# Patient Record
Sex: Female | Born: 1955 | Race: White | Hispanic: No | Smoking: Never smoker
Health system: Southern US, Community
[De-identification: ages and names within clinical notes are randomized; demographics above are authoritative.]

## PROBLEM LIST (undated history)

## (undated) DIAGNOSIS — B029 Zoster without complications: Secondary | ICD-10-CM

## (undated) DIAGNOSIS — G4730 Sleep apnea, unspecified: Secondary | ICD-10-CM

## (undated) DIAGNOSIS — M1990 Unspecified osteoarthritis, unspecified site: Secondary | ICD-10-CM

## (undated) DIAGNOSIS — I10 Essential (primary) hypertension: Secondary | ICD-10-CM

## (undated) DIAGNOSIS — M4800 Spinal stenosis, site unspecified: Secondary | ICD-10-CM

## (undated) DIAGNOSIS — T7840XA Allergy, unspecified, initial encounter: Secondary | ICD-10-CM

## (undated) DIAGNOSIS — K219 Gastro-esophageal reflux disease without esophagitis: Secondary | ICD-10-CM

## (undated) DIAGNOSIS — F32A Depression, unspecified: Secondary | ICD-10-CM

## (undated) DIAGNOSIS — J309 Allergic rhinitis, unspecified: Secondary | ICD-10-CM

## (undated) DIAGNOSIS — G43909 Migraine, unspecified, not intractable, without status migrainosus: Secondary | ICD-10-CM

## (undated) DIAGNOSIS — F329 Major depressive disorder, single episode, unspecified: Secondary | ICD-10-CM

## (undated) DIAGNOSIS — G4733 Obstructive sleep apnea (adult) (pediatric): Secondary | ICD-10-CM

## (undated) HISTORY — DX: Gastro-esophageal reflux disease without esophagitis: K21.9

## (undated) HISTORY — DX: Major depressive disorder, single episode, unspecified: F32.9

## (undated) HISTORY — DX: Depression, unspecified: F32.A

## (undated) HISTORY — DX: Unspecified osteoarthritis, unspecified site: M19.90

## (undated) HISTORY — DX: Allergic rhinitis, unspecified: J30.9

## (undated) HISTORY — DX: Essential (primary) hypertension: I10

## (undated) HISTORY — DX: Allergy, unspecified, initial encounter: T78.40XA

## (undated) HISTORY — DX: Spinal stenosis, site unspecified: M48.00

## (undated) HISTORY — DX: Obstructive sleep apnea (adult) (pediatric): G47.33

## (undated) HISTORY — DX: Migraine, unspecified, not intractable, without status migrainosus: G43.909

## (undated) HISTORY — PX: KNEE SURGERY: SHX244

## (undated) HISTORY — DX: Zoster without complications: B02.9

---

## 1998-01-29 ENCOUNTER — Ambulatory Visit (HOSPITAL_COMMUNITY): Admission: RE | Admit: 1998-01-29 | Discharge: 1998-01-29 | Payer: Self-pay | Admitting: *Deleted

## 1998-06-26 ENCOUNTER — Encounter: Admission: RE | Admit: 1998-06-26 | Discharge: 1998-07-26 | Payer: Self-pay | Admitting: Orthopedic Surgery

## 1998-08-16 ENCOUNTER — Ambulatory Visit (HOSPITAL_COMMUNITY): Admission: RE | Admit: 1998-08-16 | Discharge: 1998-08-16 | Payer: Self-pay | Admitting: *Deleted

## 1998-12-10 ENCOUNTER — Other Ambulatory Visit: Admission: RE | Admit: 1998-12-10 | Discharge: 1998-12-10 | Payer: Self-pay | Admitting: *Deleted

## 1999-04-21 ENCOUNTER — Ambulatory Visit (HOSPITAL_COMMUNITY): Admission: RE | Admit: 1999-04-21 | Discharge: 1999-04-21 | Payer: Self-pay | Admitting: *Deleted

## 2001-12-08 ENCOUNTER — Ambulatory Visit (HOSPITAL_COMMUNITY): Admission: RE | Admit: 2001-12-08 | Discharge: 2001-12-08 | Payer: Self-pay | Admitting: Internal Medicine

## 2001-12-08 ENCOUNTER — Encounter: Payer: Self-pay | Admitting: Internal Medicine

## 2001-12-15 ENCOUNTER — Encounter (INDEPENDENT_AMBULATORY_CARE_PROVIDER_SITE_OTHER): Payer: Self-pay | Admitting: *Deleted

## 2001-12-15 ENCOUNTER — Encounter: Payer: Self-pay | Admitting: Internal Medicine

## 2001-12-15 ENCOUNTER — Encounter: Admission: RE | Admit: 2001-12-15 | Discharge: 2001-12-15 | Payer: Self-pay | Admitting: Internal Medicine

## 2003-10-19 ENCOUNTER — Emergency Department (HOSPITAL_COMMUNITY): Admission: EM | Admit: 2003-10-19 | Discharge: 2003-10-19 | Payer: Self-pay | Admitting: Emergency Medicine

## 2004-08-07 ENCOUNTER — Ambulatory Visit (HOSPITAL_COMMUNITY): Admission: RE | Admit: 2004-08-07 | Discharge: 2004-08-07 | Payer: Self-pay | Admitting: Internal Medicine

## 2004-08-18 ENCOUNTER — Encounter: Admission: RE | Admit: 2004-08-18 | Discharge: 2004-08-18 | Payer: Self-pay | Admitting: Internal Medicine

## 2008-08-06 ENCOUNTER — Encounter: Admission: RE | Admit: 2008-08-06 | Discharge: 2008-08-06 | Payer: Self-pay | Admitting: Family Medicine

## 2010-03-02 HISTORY — PX: OTHER SURGICAL HISTORY: SHX169

## 2010-06-17 ENCOUNTER — Other Ambulatory Visit: Payer: Self-pay | Admitting: Orthopedic Surgery

## 2010-06-17 ENCOUNTER — Encounter (HOSPITAL_COMMUNITY): Payer: 59

## 2010-06-17 DIAGNOSIS — Z01812 Encounter for preprocedural laboratory examination: Secondary | ICD-10-CM | POA: Insufficient documentation

## 2010-06-17 DIAGNOSIS — Z01818 Encounter for other preprocedural examination: Secondary | ICD-10-CM | POA: Insufficient documentation

## 2010-06-17 LAB — PROTIME-INR: Prothrombin Time: 13.2 seconds (ref 11.6–15.2)

## 2010-06-17 LAB — COMPREHENSIVE METABOLIC PANEL
Alkaline Phosphatase: 85 U/L (ref 39–117)
CO2: 29 mEq/L (ref 19–32)
Chloride: 104 mEq/L (ref 96–112)
GFR calc Af Amer: >60 mL/min (ref >60)
Potassium: 3.9 mEq/L (ref 3.5–5.1)
Sodium: 142 mEq/L (ref 135–145)
Total Bilirubin: 0.5 mg/dL (ref 0.3–1.2)
Total Protein: 7.8 g/dL (ref 6.0–8.3)

## 2010-06-17 LAB — URINALYSIS, ROUTINE W REFLEX MICROSCOPIC
Glucose, UA: NEGATIVE mg/dL
Protein, ur: NEGATIVE mg/dL
Specific Gravity, Urine: 1.021 (ref 1.005–1.030)
pH: 6 (ref 5.0–8.0)

## 2010-06-17 LAB — SURGICAL PCR SCREEN
MRSA, PCR: NEGATIVE
Staphylococcus aureus: NEGATIVE

## 2010-06-17 LAB — CBC
MCV: 86.3 fL (ref 78.0–100.0)
Platelets: 300 10*3/uL (ref 150–400)
RBC: 4.61 MIL/uL (ref 3.87–5.11)
RDW: 14 % (ref 11.5–15.5)

## 2010-06-23 ENCOUNTER — Inpatient Hospital Stay (HOSPITAL_COMMUNITY)
Admission: RE | Admit: 2010-06-23 | Discharge: 2010-06-26 | DRG: 470 | Disposition: A | Discharge status: Home Health | Payer: 59 | Source: Ambulatory Visit | Attending: Orthopedic Surgery | Admitting: Orthopedic Surgery

## 2010-06-23 DIAGNOSIS — M1710 Unilateral primary osteoarthritis, unspecified knee: Principal | ICD-10-CM | POA: Diagnosis present

## 2010-06-23 DIAGNOSIS — Z6841 Body Mass Index (BMI) 40.0 and over, adult: Secondary | ICD-10-CM

## 2010-06-23 DIAGNOSIS — G4733 Obstructive sleep apnea (adult) (pediatric): Secondary | ICD-10-CM | POA: Diagnosis present

## 2010-06-23 DIAGNOSIS — K219 Gastro-esophageal reflux disease without esophagitis: Secondary | ICD-10-CM | POA: Diagnosis present

## 2010-06-23 DIAGNOSIS — Z01812 Encounter for preprocedural laboratory examination: Secondary | ICD-10-CM

## 2010-06-23 LAB — GLUCOSE, CAPILLARY: Glucose-Capillary: 150 mg/dL — ABNORMAL HIGH (ref 70–99)

## 2010-06-24 LAB — GLUCOSE, CAPILLARY
Glucose-Capillary: 139 mg/dL — ABNORMAL HIGH (ref 70–99)
Glucose-Capillary: 113 mg/dL — ABNORMAL HIGH (ref 70–99)
Glucose-Capillary: 120 mg/dL — ABNORMAL HIGH (ref 70–99)
Glucose-Capillary: 105 mg/dL — ABNORMAL HIGH (ref 70–99)

## 2010-06-24 LAB — CBC
Hemoglobin: 12.1 g/dL (ref 12.0–15.0)
MCH: 28 pg (ref 26.0–34.0)
MCHC: 32.3 g/dL (ref 30.0–36.0)
MCV: 86.8 fL (ref 78.0–100.0)
RBC: 4.32 MIL/uL (ref 3.87–5.11)
RDW: 13.7 % (ref 11.5–15.5)

## 2010-06-24 LAB — BASIC METABOLIC PANEL
GFR calc non Af Amer: >60 mL/min (ref >60)
Sodium: 137 mEq/L (ref 135–145)

## 2010-06-25 LAB — BASIC METABOLIC PANEL
GFR calc Af Amer: >60 mL/min (ref >60)
GFR calc non Af Amer: >60 mL/min (ref >60)
Potassium: 4.3 mEq/L (ref 3.5–5.1)
Sodium: 137 mEq/L (ref 135–145)

## 2010-06-25 LAB — CBC
HCT: 36.8 % (ref 36.0–46.0)
MCH: 28 pg (ref 26.0–34.0)
MCHC: 32.1 g/dL (ref 30.0–36.0)
Platelets: 257 10*3/uL (ref 150–400)

## 2010-06-25 LAB — GLUCOSE, CAPILLARY
Glucose-Capillary: 101 mg/dL — ABNORMAL HIGH (ref 70–99)
Glucose-Capillary: 120 mg/dL — ABNORMAL HIGH (ref 70–99)

## 2010-06-26 LAB — GLUCOSE, CAPILLARY: Glucose-Capillary: 106 mg/dL — ABNORMAL HIGH (ref 70–99)

## 2010-06-26 LAB — CBC
Hemoglobin: 11.7 g/dL — ABNORMAL LOW (ref 12.0–15.0)
MCH: 28.1 pg (ref 26.0–34.0)
MCHC: 32.2 g/dL (ref 30.0–36.0)
RDW: 13.8 % (ref 11.5–15.5)

## 2010-06-30 NOTE — Op Note (Signed)
Michelle Sanchez               ACCOUNT NO.:  [id number]  MEDICAL RECORD NO.:  [id number]           PATIENT TYPE:  I  LOCATION:  0004                         FACILITY:  [facility]  PHYSICIAN:  Ollen Gross, M.D.    DATE OF BIRTH:  Oct 13, 1955  DATE OF PROCEDURE:  06/23/2010 DATE OF DISCHARGE:                              OPERATIVE REPORT   PREOPERATIVE DIAGNOSIS:  Osteoarthritis, right knee.  POSTOPERATIVE DIAGNOSIS:  Osteoarthritis, right knee.  PROCEDURE:  Right total knee arthroplasty.  SURGEON:  Ollen Gross, M.D.  ASSISTANT:  Rozell Searing, PA-C  ANESTHESIA:  Spinal.  ESTIMATED BLOOD LOSS:  Minimal.  DRAIN:  Hemovac x1.  TOURNIQUET TIME:  43 minutes at 300 mmHg.  COMPLICATIONS:  None.  CONDITION.:  Stable to recovery.  BRIEF CLINICAL NOTE:  Michelle Sanchez is a 55 year old female with advanced end- stage arthritis of both knees, right more symptomatic than the left. She has failed nonoperative management and presents for total knee arthroplasty.  PROCEDURE IN DETAIL:  After successful administration of spinal anesthetic, a tourniquet was placed high on her right thigh and her right lower extremity was prepped and draped in the usual sterile fashion.  Extremity was wrapped in Esmarch, knee flexed, tourniquet inflated to 300 mmHg.  Midline incision was made with a #10 blade through the subcutaneous tissue to the level of the extensor mechanism. A fresh blade was used to make a medial parapatellar arthrotomy.  Soft tissue on the proximal medial tibia subperiosteally elevated to the joint line with the knife and into the semimembranosus bursa with a Cobb elevator.  Soft tissue laterally was elevated with attention being paid to avoid patellar tendon on tibial tubercle.  Patella was everted, knee flexed 90 degrees, and ACL and PCL removed.  Drill was used create a starting hole in the distal femur and the canal was thoroughly irrigated.  The 5-degree right valgus  alignment guide was placed and distal femoral cutting block was pinned to remove 10 mm off the distal femur.  Distal femoral resection was made with an oscillating saw.  The tibia subluxed forward and the menisci were removed.  Extramedullary tibial alignment guide was placed referencing proximally at the medial aspect of the tibial tubercle and distally along the second metatarsal axis and tibial crest.  Block was pinned to remove 2 mm off the more deficient medial side.  Tibial resection was made with an oscillating saw.  Size 3 was the most appropriate tibial component and proximal tibia was repaired with modular drill and keel punch for the size 3.  Femoral sizing guide was placed, size 3 was the most appropriate on the femur.  Rotation was marked at the epicondylar axis and confirmed by creating rectangular flexion gap at 90 degrees.  The block was pinned in this rotation and anterior-posterior chamfer cuts made.  Intercondylar block was placed and that cut was made.  Trial size 3 posterior stabilized femur was placed.  A 12.5-mm posterior stabilized rotating platform insert trial was placed.  With a 12.5, full extension was achieved with excellent varus-valgus and anterior-posterior balance throughout full range of motion.  The patella was  everted, thickness measured to be 25 mm.  Freehand resection was taken to 15 mm, 35 template was placed, lug holes were drilled, trial patella was placed and it tracks normally.  The osteophytes were removed off the posterior femur with the trial in place.  All trials were removed and the cut bone surfaces were prepared with pulsatile lavage.  Cement was mixed and once ready for implantation, the size 3 mobile bearing tibial tray, size 3 posterior stabilized femur, and 35 patella were cemented into place and the patella was held with a clamp.  Trial 12.5-mm insert was placed, knee held in full extension, and all extruded cement removed.  When  the cement fully hardened, then the permanent 12.5-mm posterior stabilized rotating platform insert was placed in the tibial tray.  Wound was copiously irrigated with saline solution and the arthrotomy closed over Hemovac drain with interrupted #1 PDS.  Flexion against gravity was to 135 degrees.  Patella tracked normally.  Tourniquet released, total tourniquet time of 43 minutes.  Subcu was closed with interrupted 2-0 Vicryl, subcuticular with running 4-0 Monocryl.  Catheter for Marcaine pain pump was placed and pump initiated.  The incision was then cleaned and dried and Steri-Strips applied.  A bulky sterile dressing was applied and she was then awakened and transported to recovery in stable condition.     Ollen Gross, M.D.     FA/MEDQ  D:  06/23/2010  T:  06/23/2010  Job:  [id number]  Electronically Signed by Ollen Gross M.D. on 06/30/2010 07:12:18 AM

## 2010-07-07 NOTE — H&P (Signed)
Michelle Sanchez, Michelle Sanchez               ACCOUNT NO.:  [id number]  MEDICAL RECORD NO.:  [id number]           PATIENT TYPE:  I  LOCATION:  1617                         FACILITY:  [facility]  PHYSICIAN:  Ollen Gross, M.D.    DATE OF BIRTH:  [birth year]  DATE OF ADMISSION:  06/23/2010 DATE OF DISCHARGE:  06/26/2010                             HISTORY & PHYSICAL   CHIEF COMPLAINT:  Right greater than left knee pain.  HISTORY OF PRESENT ILLNESS:  The patient is a 55 year old female who has been seen as second opinion for bilateral knee pain, the right knee is more problematic and symptomatic than the left.  She has been previously treated over [employer], had multiple injections including Supartz.  Despite conservative measures and injections, she continued to have progressive pain.  She has advanced arthritis in both knees, it is bone-on-bone in the right knee and patellofemoral with flattening of the femoral condyle, looks like she also has some osteochondral defects, bone-on-bone medial and less patellofemoral diseases on the left, elects for undergoing surgical intervention.  Risks and benefits have been discussed.  She would like to proceed with surgery.  She has been seen by Dr. Raquel Sarna preoperatively, felt to be stable for surgery.  ALLERGIES:  VOLTAREN causes hives.  Intolerances, SEPTRA causes diarrhea.  CURRENT MEDICATIONS:  Meloxicam, omeprazole, omega 3, Ambien, Super B complex, inositol,  magnesium, and Allegra.  PAST MEDICAL HISTORY:  Migraines, sleep apnea.  She uses CPAP.  Heart murmur, reflux disease, arthritis, postmenopausal.  PAST SURGICAL HISTORY:  Left knee tendon surgery, arthroscopic surgery on both knees.  FAMILY HISTORY:  Father deceased at age 18 with cancer.  Mother with heart problems, COPD.  Brother deceased at age 26 with diabetes.  SOCIAL HISTORY:  Single, retired, nonsmoker.  Occasional glass of wine. Her boyfriend will be assisting  with care after surgery.  She lives in [place] home, 3 to 4 steps going to the back, 4 to 5 steps going to the front.  REVIEW OF SYSTEMS:  GENERAL:  No fevers, chills, night sweats.  NEURO: No seizure, syncope, or paralysis.  RESPIRATORY:  No shortness breath, productive cough or hemoptysis.  CARDIOVASCULAR:  No chest pain, orthopnea.  GI:  No nausea, vomiting, diarrhea, constipation.  GU:  No dysuria, hematuria, or discharge.  MUSCULOSKELETAL:  Knee pain.  PHYSICAL EXAMINATION:  VITAL SIGNS:  Pulse 96, respirations 12, blood pressure 132/86. GENERAL:  A 55 year old white female, well nourished, well developed, overweight, alert, oriented, cooperative, pleasant, good historian. HEENT:  Normocephalic, atraumatic.  Pupils round and reactive.  EOMs intact. NECK:  Supple. CHEST:  Clear. HEART:  Regular rate and rhythm without murmur or rubs.  S1, S2. ABDOMEN:  Soft, nontender.  Bowel sounds present. RECTAL:  Not done, not pertinent to present illness. BREASTS:  Not done, not pertinent to present illness. GENITALIA:  Not done, not pertinent to present illness. EXTREMITIES:  Left knee, no effusion, range of motion 5 to 120, marked crepitus and tender more medial than lateral.  Right knee, no effusion, range of motion 10 to 120, marked crepitus tender more medial than lateral.  IMPRESSION:  Osteoarthritis in bilateral knees, right more symptomatic than left.  PLAN:  The patient will be admitted to [facility] to undergo right total knee replacement arthroplasty.  Surgery will be performed by Dr. Ollen Gross.     Alexzandrew L. Julien Girt, P.A.C.   ______________________________ Ollen Gross, M.D.    ALP/MEDQ  D:  06/30/2010  T:  06/30/2010  Job:  [id number]  cc:   Alexia Freestone, M.D. Fax: [phone]  Electronically Signed by Patrica Duel P.A.C. on 07/03/2010 10:06:34 AM Electronically Signed by Ollen Gross M.D. on 07/07/2010 06:47:03 PM

## 2010-07-23 NOTE — Discharge Summary (Signed)
Michelle Sanchez, Michelle Sanchez               ACCOUNT NO.:  [id number]  MEDICAL RECORD NO.:  [id number]           PATIENT TYPE:  I  LOCATION:  1617                         FACILITY:  [facility]  PHYSICIAN:  Ollen Gross, M.D.    DATE OF BIRTH:  March 15, 1955  DATE OF ADMISSION:  06/23/2010 DATE OF DISCHARGE:  06/26/2010                              DISCHARGE SUMMARY   ADMITTING DIAGNOSES: 1. Osteoarthritis of the right knee greater than the left knee. 2. Migraines. 3. Sleep apnea. 4. Heart murmur. 5. Reflux disease. 6. Osteoarthritis. 7. Postmenopausal.  DISCHARGE DIAGNOSES: 1. Osteoarthritis of the right knee status post right total-knee     replacement arthroplasty. 2. Osteoarthritis of the left knee. 3. Migraines. 4. Sleep apnea. 5. Heart murmur. 6. Reflux disease. 7. Osteoarthritis. 8. Postmenopausal.  PROCEDURE:  June 23, 2010, right total-knee arthroplasty.  SURGEON:  Ollen Gross, MD  ASSISTANT:  Rozell Searing, PA-C  ANESTHESIA:  Spinal anesthesia.  TOURNIQUET TIME:  43 minutes.  CONSULTATIONS:  None.  BRIEF HISTORY:  The patient is a 55 year old female with advanced arthritis of both knees.  The right is more symptomatic and problematic than the left.  She now presents for a total-knee arthroplasty.  LABORATORY DATA:  The admission CBC was not scanned into this chart.  I do not have it available, but serial CBCs were followed.  Hemoglobin was 12.1 postoperative and then 11.8.  Last noted H and H was 11.7 and 36.3. The admission Chemistry panel was not scanned into this chart.  Serial BMETs were followed for 48 hours.  Electrolytes all remained within normal limits.  Blood group type O positive.  HOSPITAL COURSE:  The patient was admitted to [facility], taken to the OR, and underwent the above-stated procedure without complication.  The patient tolerated the procedure well and was later transferred to the recovery room on the orthopedic floor.  She  was started on p.o. and IV analgesics.  She had a pretty rough night on the evening of surgery.  She was doing a little bit better on the morning of day #1.  Output was a little low initially so we gave her some fluids to increase that.  She was started back on her home medications. Hemoglobin looked good and pressure was stable.  She started getting up out of bed on day #2.  She was feeling better.  Her hemoglobin looked good.  Dressing was changed and the incision was healing well.  She was started on Xarelto for DVT prophylaxis.  She continued to progress well with physical therapy and by day #3 was meeting her goals and discharged home.  DISCHARGE PLAN:  The patient was discharged home on June 26, 2010.  DISCHARGE DIAGNOSES:  Please see above.  DISCHARGE MEDICATIONS:  Robaxin, Percocet, Xarelto.  Continue home medications of natural tablets, omeprazole and Singulair.  DIET:  As tolerated.  ACTIVITY:  Weightbearing as tolerated.  Total knee protocol.  Home health PT.  FOLLOWUP:  Follow up in 2 weeks.  DISPOSITION:  Home.  CONDITION ON DISCHARGE:  Improved.     Alexzandrew L. Perkins, P.A.C.   ______________________________ Homero Fellers  Marlowe Lawes, M.D.    ALP/MEDQ  D:  07/17/2010  T:  07/17/2010  Job:  [id number]  cc:   Alexia Freestone, M.D. Fax: [phone]  Electronically Signed by Patrica Duel P.A.C. on 07/21/2010 11:05:41 AM Electronically Signed by Ollen Gross M.D. on 07/23/2010 12:52:30 PM

## 2011-01-08 ENCOUNTER — Other Ambulatory Visit: Payer: Self-pay | Admitting: Orthopedic Surgery

## 2011-01-09 ENCOUNTER — Other Ambulatory Visit: Payer: Self-pay | Admitting: Orthopedic Surgery

## 2011-02-18 ENCOUNTER — Ambulatory Visit (INDEPENDENT_AMBULATORY_CARE_PROVIDER_SITE_OTHER): Payer: 59 | Admitting: Internal Medicine

## 2011-02-18 ENCOUNTER — Encounter: Payer: Self-pay | Admitting: Internal Medicine

## 2011-02-18 VITALS — BP 142/100 | HR 100 | Temp 98.0°F | Ht 68.0 in | Wt 293.0 lb

## 2011-02-18 DIAGNOSIS — G43909 Migraine, unspecified, not intractable, without status migrainosus: Secondary | ICD-10-CM

## 2011-02-18 DIAGNOSIS — IMO0002 Reserved for concepts with insufficient information to code with codable children: Secondary | ICD-10-CM

## 2011-02-18 DIAGNOSIS — I10 Essential (primary) hypertension: Secondary | ICD-10-CM

## 2011-02-18 DIAGNOSIS — Z01818 Encounter for other preprocedural examination: Secondary | ICD-10-CM

## 2011-02-18 DIAGNOSIS — K219 Gastro-esophageal reflux disease without esophagitis: Secondary | ICD-10-CM

## 2011-02-18 DIAGNOSIS — M1710 Unilateral primary osteoarthritis, unspecified knee: Secondary | ICD-10-CM

## 2011-02-18 MED ORDER — OMEPRAZOLE 20 MG PO CPDR: 20.0000 mg | DELAYED_RELEASE_CAPSULE | Freq: Every day | ORAL | Status: DC

## 2011-02-18 MED ORDER — METAXALONE 800 MG PO TABS: 800.0000 mg | ORAL_TABLET | Freq: Three times a day (TID) | ORAL | Status: AC | PRN

## 2011-02-18 MED ORDER — MELOXICAM 15 MG PO TABS: 15.0000 mg | ORAL_TABLET | Freq: Every day | ORAL | Status: DC

## 2011-02-18 MED ORDER — AMLODIPINE BESYLATE 5 MG PO TABS: 5.0000 mg | ORAL_TABLET | Freq: Every day | ORAL | Status: DC

## 2011-02-18 NOTE — Assessment & Plan Note (Signed)
See above - s/p R TKR 06/2010, planning L TKR 04/2011 Resume meloxicam and skelaxin for associated with back spasm and pain

## 2011-02-18 NOTE — Patient Instructions (Signed)
It was good to see you today. Medications reviewed, start Norvasc 5mg  daily for blood pressure - also skelaxin as needed for muscle spasm - Your prescription(s) have been submitted to your pharmacy. Please take as directed and contact our office if you believe you are having problem(s) with the medication(s). Refill on medication(s) as discussed today. You have been evaluated and it is felt that the surgical risk is low and outweighed by the potential benefit of the surgery. Therefore, medically clear to proceed when scheduling allows. Will complete forma and send to Dr. Berton Lan as requested

## 2011-02-18 NOTE — Assessment & Plan Note (Signed)
On PPI - The current medical regimen is effective;  continue present plan and medications.  

## 2011-02-18 NOTE — Progress Notes (Signed)
Subjective:    [name], female    DOB: 11-04-1955, 55 y.o.   MRN: [id number]  HPI New pt to me and our practice, here to establish care  Reviewed chronic medical issues: HTN - on tx until 2010 - then stopped same due to successful BP control with weight loss - now weight gain with increase BP reading - willing to resume med - prev on norvasc without adverse side effects   low back pain and spasm - worse with knee problems - prev on meloxicam and skelakin as needed, ?resume same  GERD - the patient reports compliance with medication(s) as prescribed. Denies adverse side effects. Requests refills  Also here for preop clearance - requested by Dr. Roswell Miners and planning L TKR 04/2011 no chest pain, change in edema - no shortness of breath or dyspnea on exertion  no history of coronary artery disease or chronic kidney disease; no diabetes mellitus   pt able to walk 2 blocks without fatigue (but limited by pain in knees, especially if climbing stairs)   Past Medical History  Diagnosis Date  . Arthritis   . GERD (gastroesophageal reflux disease)   . Heart murmur   . Hypertension   . Migraines    Family History  Problem Relation Age of Onset  . Arthritis Mother   . Breast cancer Mother   . Arthritis Father   . Alcohol abuse Other   . Diabetes Other    History  Substance Use Topics  . Smoking status: Never Smoker   . Smokeless tobacco: Not on file  . Alcohol Use: No   Review of Systems Constitutional: Negative for fever or weight change.  Respiratory: Negative for cough and shortness of breath.   Cardiovascular: Negative for chest pain or palpitations.  Gastrointestinal: Negative for abdominal pain, no bowel changes.  Musculoskeletal: Negative for gait problem or joint swelling.  Skin: Negative for rash.  Neurological: Negative for dizziness or headache.  No other specific complaints in a complete review of systems (except as listed in HPI above).       Objective:   Physical Exam BP 142/100  Pulse 100  Temp(Src) 98 F (36.7 C) (Oral)  Ht 5\' 8"  (1.727 m)  Wt 293 lb (132.904 kg)  BMI 44.55 kg/m2  SpO2 99% Wt Readings from Last 3 Encounters:  02/18/11 293 lb (132.904 kg)   Constitutional: She is overweight, but appears well-developed and well-nourished. No distress.  HENT: Head: Normocephalic and atraumatic. Ears: B TMs ok, no erythema or effusion; Nose: Nose normal.  Mouth/Throat: Oropharynx is clear and moist. No oropharyngeal exudate.  Eyes: Conjunctivae and EOM are normal. Pupils are equal, round, and reactive to light. No scleral icterus.  Neck: Normal range of motion. Neck supple. No JVD present. No thyromegaly present.  Cardiovascular: Normal rate, regular rhythm and normal heart sounds.  No murmur heard. No BLE edema. Pulmonary/Chest: Effort normal and breath sounds normal. No respiratory distress. She has no wheezes.  Abdominal: Soft. Bowel sounds are normal. She exhibits no distension. There is no tenderness. no masses Musculoskeletal:L knee - boggy synovitis - tender to palpation over joint line; FROM and ligamentous function intact - r knee s/p TKR with normal range of motion, no joint effusions. No gross deformities Neurological: She is alert and oriented to person, place, and time. No cranial nerve deficit. Coordination normal.  Skin: Skin is warm and dry. No rash noted. No erythema.  Psychiatric: She has a normal mood and affect. Her  behavior is normal. Judgment and thought content normal.   Lab Results  Component Value Date   WBC 11.0* 06/26/2010   HGB 11.7* 06/26/2010   HCT 36.3 06/26/2010   PLT 270 06/26/2010   GLUCOSE 105* 06/25/2010   ALT 19 06/17/2010   AST 15 06/17/2010   NA 137 06/25/2010   K 4.3 06/25/2010   CL 103 06/25/2010   CREATININE 0.62 06/25/2010   BUN 7 06/25/2010   CO2 31 06/25/2010   INR 0.98 06/17/2010   EKG - NSR @ 95 bpm - no ischemic change or arrythmia     Assessment & Plan:  Preop clearance - L  TKR planned 04/2011 - This patient has been evaluated and it is felt that the surgical risk is low and outweighed by the potential benefit of the surgery. Therefore, medically clear to proceed when scheduling allows. Form competed and will fax to ortho as requested  Also see problem list. Medications and labs reviewed today.

## 2011-02-18 NOTE — Assessment & Plan Note (Signed)
Uncontrolled - resume prior tx (on norvasc until 2010) - new erx done today EKG without ischemic changes follow up 6-12 weeks to reassess tx and titrate as needed - Resume diet and exercise efforts for weight control of same  BP Readings from Last 3 Encounters:  02/18/11 142/100

## 2011-03-22 ENCOUNTER — Other Ambulatory Visit: Payer: Self-pay | Admitting: Orthopedic Surgery

## 2011-03-22 NOTE — H&P (Signed)
Michelle Sanchez  DOB: 11-Apr-1955 Single / Language: [place] / Race: White / Female  Date of Admission:  04/13/2011  Chief Complaint:  Left Knee Pain  History of Present Illness The patient is a 56 year old female who comes in today for a preoperative History and Physical. The patient is scheduled for a left total knee arthroplasty to be performed by Dr. Gus Rankin. Aluisio, MD at [facility] on 04/13/2011. They have been treated conservatively in the past for the above stated problem and despite conservative measures, they continue to have progressive pain and severe functional limitations and dysfunction. They have failed non-operative management including home exercise, medications, and injections. It is felt that they would benefit from undergoing total joint replacement. Risks and benefits of the procedure have been discussed with the patient and they elect to proceed with surgery. There are no active contraindications to surgery such as ongoing infection or rapidly progressive neurological disease.  Allergies Voltaren *ANALGESICS - ANTI-INFLAMMATORY*. Hives. Septra *ANTI-INFECTIVE AGENTS - MISC.*. Diarrhea.  Medication History Omeprazole (20MG  Capsule DR, Oral daily) Active. AmLODIPine Besylate (5MG  Tablet, Oral daily) Active. Meloxicam (15MG  Tablet, Oral daily) Active. Vitamin ( Oral daily) Active.  Problem List/Past Medical Migraine Headache Sleep Apnea. uses CPAP Hypertension Osteoarthrosis NOS, lower leg (715.96). 07/29/2010 Total knee replacement status (V43.65)  Past Surgical History Total Knee Replacement - Right. Date: 06/23/2010. Arthroscopic Knee Surgery - Both. multiple procedures  Family History Father. Deceased, Cancer. age 44 Mother. Living, Heart disease. Lung problems, age 39  Social History Tobacco use Alcohol use. Occasional alcohol use. Marital status. Single. Living situation. Lives with caregiver. boyfriend Current work status.  Retired. Post-Surgical Plans. Plans to go home.  Review of Systems General:Not Present- Chills, Fever, Night Sweats, Fatigue, Weight Gain, Weight Loss and Memory Loss. Skin:Not Present- Hives, Itching, Rash, Eczema and Lesions. HEENT:Not Present- Tinnitus, Headache, Double Vision, Visual Loss, Hearing Loss and Dentures. Respiratory:Not Present- Shortness of breath with exertion, Shortness of breath at rest, Allergies, Coughing up blood and Chronic Cough. Cardiovascular:Not Present- Chest Pain, Racing/skipping heartbeats, Difficulty Breathing Lying Down, Murmur, Swelling and Palpitations. Gastrointestinal:Not Present- Bloody Stool, Heartburn, Abdominal Pain, Vomiting, Nausea, Constipation, Diarrhea, Difficulty Swallowing, Jaundice and Loss of appetitie. Female Genitourinary:Not Present- Blood in Urine, Urinary frequency, Weak urinary stream, Discharge, Flank Pain, Incontinence, Painful Urination, Urgency, Urinary Retention and Urinating at Night. Musculoskeletal:Present- Joint Swelling, Joint Pain and Back Pain. Not Present- Muscle Weakness, Muscle Pain, Morning Stiffness and Spasms. Neurological:Not Present- Tremor, Dizziness, Blackout spells, Paralysis, Difficulty with balance and Weakness. Psychiatric:Not Present- Insomnia.  Vitals Weight: 293 lb Height: 68 in. Body Surface Area: 2.53 m Body Mass Index: 44.55 kg/m Pulse: 100 (Regular) Resp.: 12 (Unlabored) BP: 124/76 (Sitting, Left Arm, Standard)  Physical Exam The physical exam findings are as follows: Patient is a 56 year old female with continued left knee pain.  General Mental Status - Alert, cooperative and good historian. General Appearance- pleasant. Not in acute distress. Orientation- Oriented X3. Build & Nutrition- Well nourished and Well developed.  Head and Neck Head- normocephalic, atraumatic . Neck Global Assessment- supple. no bruit auscultated on the right and no bruit auscultated on  the left.  Eye Pupil- Bilateral- Regular and Round. Note: reading glasses Motion- Bilateral- EOMI.  Chest and Lung Exam Auscultation: Breath sounds:- clear at anterior chest wall and - clear at posterior chest wall. Adventitious sounds:- No Adventitious sounds.  Cardiovascular Auscultation:Rhythm- Regular rate and rhythm. Heart Sounds- S1 WNL and S2 WNL. Murmurs & Other Heart Sounds:Auscultation of  the heart reveals - No Murmurs.  Abdomen Palpation/Percussion:Tenderness- Abdomen is non-tender to palpation. Rigidity (guarding)- Abdomen is soft. Auscultation:Auscultation of the abdomen reveals - Bowel sounds normal.  Female Genitourinary Not done, not pertinent to present illness  Musculoskeletal  On exam she is in no distress. Her left knee shows no swelling. Her range of motion is about 0 to 110 to 115 degrees. There is no instability noted about the left knee. The right knee has significant varus, range 10 to 100 with marked crepitus on range of motion, tender medial greater than lateral with no instability noted.  RADIOGRAPHS: AP and lateral of both knees show the left knee prosthesis in excellent position with no periprosthetic abnormality. Right knee bone on bone degenerative change medial and patellofemoral compartments with varus deformity, marginal osteophytes and subluxation of the tibia and femur.  Assessment & Plan Osteoarthritis Left Knee  Patient is for a Left Total Knee Replacement by Dr. Lequita Halt.  Plan is to go home after the surgery.  PCP - Dr. Rene Paci - Patient has been seen by Dr. Felicity Coyer and it is felt she is stable for surgery.  Avel Peace, PA-C

## 2011-03-31 ENCOUNTER — Encounter (HOSPITAL_COMMUNITY): Payer: Self-pay

## 2011-04-07 ENCOUNTER — Encounter (HOSPITAL_COMMUNITY): Payer: Self-pay

## 2011-04-07 ENCOUNTER — Encounter (HOSPITAL_COMMUNITY)
Admission: RE | Admit: 2011-04-07 | Discharge: 2011-04-07 | Disposition: A | Discharge status: Home / Self Care | Payer: 59 | Source: Ambulatory Visit | Attending: Orthopedic Surgery | Admitting: Orthopedic Surgery

## 2011-04-07 ENCOUNTER — Ambulatory Visit (HOSPITAL_COMMUNITY)
Admission: RE | Admit: 2011-04-07 | Discharge: 2011-04-07 | Disposition: A | Discharge status: Home / Self Care | Payer: 59 | Source: Ambulatory Visit | Attending: Orthopedic Surgery | Admitting: Orthopedic Surgery

## 2011-04-07 DIAGNOSIS — Z01818 Encounter for other preprocedural examination: Secondary | ICD-10-CM | POA: Insufficient documentation

## 2011-04-07 DIAGNOSIS — Z01812 Encounter for preprocedural laboratory examination: Secondary | ICD-10-CM | POA: Insufficient documentation

## 2011-04-07 HISTORY — DX: Sleep apnea, unspecified: G47.30

## 2011-04-07 LAB — COMPREHENSIVE METABOLIC PANEL
ALT: 23 U/L (ref 0–35)
Alkaline Phosphatase: 104 U/L (ref 39–117)
CO2: 26 mEq/L (ref 19–32)
GFR calc Af Amer: >90 mL/min (ref >90)
GFR calc non Af Amer: >90 mL/min (ref >90)
Glucose, Bld: 95 mg/dL (ref 70–99)
Potassium: 3.8 mEq/L (ref 3.5–5.1)
Sodium: 140 mEq/L (ref 135–145)

## 2011-04-07 LAB — CBC
Hemoglobin: 14.1 g/dL (ref 12.0–15.0)
MCH: 28.7 pg (ref 26.0–34.0)
RBC: 4.92 MIL/uL (ref 3.87–5.11)

## 2011-04-07 LAB — URINALYSIS, ROUTINE W REFLEX MICROSCOPIC
Glucose, UA: NEGATIVE mg/dL
Hgb urine dipstick: NEGATIVE
Ketones, ur: NEGATIVE mg/dL
Protein, ur: NEGATIVE mg/dL

## 2011-04-07 LAB — APTT: aPTT: 34 seconds (ref 24–37)

## 2011-04-07 LAB — SURGICAL PCR SCREEN: Staphylococcus aureus: NEGATIVE

## 2011-04-07 MED ORDER — CHLORHEXIDINE GLUCONATE 4 % EX LIQD
60.0000 mL | Freq: Once | CUTANEOUS | Status: DC
  Filled 2011-04-07: qty 60

## 2011-04-07 NOTE — Pre-Procedure Instructions (Signed)
04-07-11 EKG 12'12/ Surgical clearance note with chart. CXR being done today.

## 2011-04-07 NOTE — Patient Instructions (Signed)
20 Michelle Sanchez  04/07/2011   Your procedure is scheduled on: 04-13-11  Report to [facility] at    0515    AM.  Call this number if you have problems the morning of surgery: [phone]   Remember:   Do not eat food:After Midnight.  .  Take these medicines the morning of surgery with A SIP OF WATER: Amlodipine, Omeprazole.   Do not wear jewelry, make-up or nail polish.  Do not wear lotions, powders, or perfumes. You may wear deodorant.  Do not shave 48 hours prior to surgery.(no shaving of legs )  Do not bring valuables to the hospital.  Contacts, dentures or bridgework may not be worn into surgery.  Leave suitcase in the car. After surgery it may be brought to your room.  For patients admitted to the hospital, checkout time is 11:00 AM the day of discharge.   Patients discharged the day of surgery will not be allowed to drive home.  Name and phone number of your driver: [name], [phone]  Special Instructions: CHG Shower Use Special Wash: 1/2 bottle night before surgery and 1/2 bottle morning of surgery.   Please read over the following fact sheets that you were given: MRSA Information, Incentive Spirometry Instruction, Blood transfusion fact sheet.

## 2011-04-12 ENCOUNTER — Other Ambulatory Visit: Payer: Self-pay | Admitting: Orthopedic Surgery

## 2011-04-12 NOTE — H&P (Signed)
Michelle Sanchez  DOB: Apr 21, 1955 Single / Language: [place] / Race: White / Female  Date of Admission:  04/13/2011  Chief Complaint:  Left Knee Pain  History of Present Illness The patient is a 56 year old female who comes in today for a preoperative History and Physical. The patient is scheduled for a left total knee arthroplasty to be performed by Dr. Gus Rankin. Aluisio, MD at [facility] on 04/13/2011. They have been treated conservatively in the past for the above stated problem and despite conservative measures, they continue to have progressive pain and severe functional limitations and dysfunction. They have failed non-operative management including home exercise, medications, and injections. It is felt that they would benefit from undergoing total joint replacement. Risks and benefits of the procedure have been discussed with the patient and they elect to proceed with surgery. There are no active contraindications to surgery such as ongoing infection or rapidly progressive neurological disease.  Allergies Voltaren - Hives. Intolerance Septra - Diarrhea.  Problem List/Past Medical Migraine Headache Sleep Apnea. uses CPAP Hypertension Osteoarthrosis NOS, lower leg (715.96). 07/29/2010 Total knee replacement status (V43.65)  Past Surgical History Total Knee Replacement - Right. Date: 06/23/2010. Arthroscopic Knee Surgery - Both. multiple procedures  Family History Father. Deceased, Cancer. age 91 Mother. Living, Heart disease. Lung problems, age 51  Social History Tobacco use Alcohol use. Occasional alcohol use. Marital status. Single. Living situation. Lives with caregiver. boyfriend Current work status. Retired. Post-Surgical Plans. Plans to go home.  Medication History Omeprazole (20MG  Capsule DR, Oral daily) Active. AmLODIPine Besylate (5MG  Tablet, Oral daily) Active. Meloxicam (15MG  Tablet, Oral daily) Active. Vitamin ( Oral daily)  Active.  Review of Systems General:Not Present- Chills, Fever, Night Sweats, Fatigue, Weight Gain, Weight Loss and Memory Loss. Skin:Not Present- Hives, Itching, Rash, Eczema and Lesions. HEENT:Not Present- Tinnitus, Headache, Double Vision, Visual Loss, Hearing Loss and Dentures. Respiratory:Not Present- Shortness of breath with exertion, Shortness of breath at rest, Allergies, Coughing up blood and Chronic Cough. Cardiovascular:Not Present- Chest Pain, Racing/skipping heartbeats, Difficulty Breathing Lying Down, Murmur, Swelling and Palpitations. Gastrointestinal:Not Present- Bloody Stool, Heartburn, Abdominal Pain, Vomiting, Nausea, Constipation, Diarrhea, Difficulty Swallowing, Jaundice and Loss of appetitie. Female Genitourinary:Not Present- Blood in Urine, Urinary frequency, Weak urinary stream, Discharge, Flank Pain, Incontinence, Painful Urination, Urgency, Urinary Retention and Urinating at Night. Musculoskeletal:Present- Joint Swelling, Joint Pain and Back Pain. Not Present- Muscle Weakness, Muscle Pain, Morning Stiffness and Spasms. Neurological:Not Present- Tremor, Dizziness, Blackout spells, Paralysis, Difficulty with balance and Weakness. Psychiatric:Not Present- Insomnia.  Vitals Weight: 293 lb Height: 68 in Body Surface Area: 2.53 m Body Mass Index: 44.55 kg/m Pulse: 100 (Regular) Resp.: 12 (Unlabored) BP: 124/76 (Sitting, Left Arm, Standard)  Physical Exam The physical exam findings are as follows:  Patient is a 56 year old female with continued left knee pain.  General Mental Status - Alert, cooperative and good historian. General Appearance- pleasant. Not in acute distress. Orientation- Oriented X3. Build & Nutrition- Well nourished and Well developed.  Head and Neck Head- normocephalic, atraumatic . Neck Global Assessment- supple. no bruit auscultated on the right and no bruit auscultated on the left.  Eye Pupil- Bilateral-  Regular and Round. Note: reading glasses Motion- Bilateral- EOMI.  Chest and Lung Exam Auscultation: Breath sounds:- clear at anterior chest wall and - clear at posterior chest wall. Adventitious sounds:- No Adventitious sounds.  Cardiovascular Auscultation:Rhythm- Regular rate and rhythm. Heart Sounds- S1 WNL and S2 WNL. Murmurs & Other Heart Sounds:Auscultation of the heart reveals -  No Murmurs.  Abdomen Palpation/Percussion:Tenderness- Abdomen is non-tender to palpation. Rigidity (guarding)- Abdomen is soft. Auscultation:Auscultation of the abdomen reveals - Bowel sounds normal.  Female Genitourinary Not done, not pertinent to present illness  Musculoskeletal On exam she is in no distress. Her left knee shows no swelling. Her range of motion is about 0 to 110 to 115 degrees. There is no instability noted about the left knee. The right knee has significant varus, range 10 to 100 with marked crepitus on range of motion, tender medial greater than lateral with no instability noted.  RADIOGRAPHS: AP and lateral of both knees show the left knee prosthesis in excellent position with no periprosthetic abnormality. Right knee bone on bone degenerative change medial and patellofemoral compartments with varus deformity, marginal osteophytes and subluxation of the tibia and femur.  Assessment & Plan Osteoarthritis Left Knee  Patient is for a Left Total Knee Replacement by Dr. Lequita Halt.  Plan is to go home after the surgery.  PCP - Dr. Rene Paci - Patient has been seen by Dr. Felicity Coyer and it is felt she is stable for surgery.  Avel Peace, PA-C

## 2011-04-12 NOTE — Anesthesia Preprocedure Evaluation (Addendum)
Anesthesia Evaluation  Patient identified by MRN, date of birth, ID band Patient awake    Reviewed: Allergy & Precautions, H&P , NPO status , Patient's Chart, lab work & pertinent test results  Airway Mallampati: II TM Distance: >3 FB Neck ROM: full    Dental No notable dental hx. (+) Teeth Intact and Dental Advisory Given   Pulmonary sleep apnea and Continuous Positive Airway Pressure Ventilation ,  clear to auscultation  Pulmonary exam normal       Cardiovascular Exercise Tolerance: Good hypertension, On Medications regular Normal    Neuro/Psych Negative Neurological ROS  Negative Psych ROS   GI/Hepatic negative GI ROS, Neg liver ROS, GERD-  Medicated and Controlled,  Endo/Other  Negative Endocrine ROSMorbid obesity  Renal/GU negative Renal ROS  Genitourinary negative   Musculoskeletal   Abdominal (+) obese,   Peds  Hematology negative hematology ROS (+)   Anesthesia Other Findings   Reproductive/Obstetrics negative OB ROS                          Anesthesia Physical Anesthesia Plan  ASA: III  Anesthesia Plan: Spinal   Post-op Pain Management:    Induction:   Airway Management Planned:   Additional Equipment:   Intra-op Plan:   Post-operative Plan:   Informed Consent: I have reviewed the patients History and Physical, chart, labs and discussed the procedure including the risks, benefits and alternatives for the proposed anesthesia with the patient or authorized representative who has indicated his/her understanding and acceptance.   Dental Advisory Given  Plan Discussed with: CRNA and Surgeon  Anesthesia Plan Comments:         Anesthesia Quick Evaluation

## 2011-04-13 ENCOUNTER — Encounter (HOSPITAL_COMMUNITY)
Admission: RE | Disposition: A | Discharge status: Home Health | Payer: Self-pay | Source: Ambulatory Visit | Attending: Orthopedic Surgery

## 2011-04-13 ENCOUNTER — Inpatient Hospital Stay (HOSPITAL_COMMUNITY): Payer: 59 | Admitting: Anesthesiology

## 2011-04-13 ENCOUNTER — Inpatient Hospital Stay (HOSPITAL_COMMUNITY)
Admission: RE | Admit: 2011-04-13 | Discharge: 2011-04-16 | DRG: 470 | Disposition: A | Discharge status: Home Health | Payer: 59 | Source: Ambulatory Visit | Attending: Orthopedic Surgery | Admitting: Orthopedic Surgery

## 2011-04-13 ENCOUNTER — Encounter (HOSPITAL_COMMUNITY): Payer: Self-pay | Admitting: Anesthesiology

## 2011-04-13 DIAGNOSIS — I10 Essential (primary) hypertension: Secondary | ICD-10-CM | POA: Diagnosis present

## 2011-04-13 DIAGNOSIS — Z01810 Encounter for preprocedural cardiovascular examination: Secondary | ICD-10-CM

## 2011-04-13 DIAGNOSIS — G43909 Migraine, unspecified, not intractable, without status migrainosus: Secondary | ICD-10-CM | POA: Diagnosis present

## 2011-04-13 DIAGNOSIS — Z79899 Other long term (current) drug therapy: Secondary | ICD-10-CM

## 2011-04-13 DIAGNOSIS — G4730 Sleep apnea, unspecified: Secondary | ICD-10-CM | POA: Diagnosis present

## 2011-04-13 DIAGNOSIS — IMO0002 Reserved for concepts with insufficient information to code with codable children: Principal | ICD-10-CM | POA: Diagnosis present

## 2011-04-13 DIAGNOSIS — E871 Hypo-osmolality and hyponatremia: Secondary | ICD-10-CM | POA: Diagnosis not present

## 2011-04-13 DIAGNOSIS — Z96659 Presence of unspecified artificial knee joint: Secondary | ICD-10-CM

## 2011-04-13 DIAGNOSIS — M1710 Unilateral primary osteoarthritis, unspecified knee: Principal | ICD-10-CM | POA: Diagnosis present

## 2011-04-13 HISTORY — PX: TOTAL KNEE ARTHROPLASTY: SHX125

## 2011-04-13 LAB — TYPE AND SCREEN
ABO/RH(D): O POS
Antibody Screen: NEGATIVE

## 2011-04-13 SURGERY — ARTHROPLASTY, KNEE, TOTAL
Anesthesia: Spinal | Site: Knee | Laterality: Left | Wound class: Clean

## 2011-04-13 MED ORDER — BUPIVACAINE IN DEXTROSE 0.75-8.25 % IT SOLN
INTRATHECAL | Status: DC | PRN
  Administered 2011-04-13: 15 mg via INTRATHECAL

## 2011-04-13 MED ORDER — DIPHENHYDRAMINE HCL 12.5 MG/5ML PO ELIX
12.5000 mg | ORAL_SOLUTION | ORAL | Status: DC | PRN
  Administered 2011-04-14: 12.5 mg via ORAL
  Filled 2011-04-13: qty 5

## 2011-04-13 MED ORDER — RIVAROXABAN 10 MG PO TABS
10.0000 mg | ORAL_TABLET | Freq: Every day | ORAL | Status: DC
  Administered 2011-04-14 – 2011-04-16 (×3): 10 mg via ORAL
  Filled 2011-04-13 (×3): qty 1

## 2011-04-13 MED ORDER — METHOCARBAMOL 500 MG PO TABS
500.0000 mg | ORAL_TABLET | Freq: Four times a day (QID) | ORAL | Status: DC | PRN
  Administered 2011-04-14 – 2011-04-15 (×6): 500 mg via ORAL
  Filled 2011-04-13 (×6): qty 1

## 2011-04-13 MED ORDER — TEMAZEPAM 15 MG PO CAPS: 15.0000 mg | ORAL_CAPSULE | Freq: Every evening | ORAL | Status: DC | PRN

## 2011-04-13 MED ORDER — ONDANSETRON HCL 4 MG/2ML IJ SOLN
4.0000 mg | Freq: Four times a day (QID) | INTRAMUSCULAR | Status: DC | PRN
  Administered 2011-04-13 – 2011-04-14 (×3): 4 mg via INTRAVENOUS

## 2011-04-13 MED ORDER — DOCUSATE SODIUM 100 MG PO CAPS
100.0000 mg | ORAL_CAPSULE | Freq: Two times a day (BID) | ORAL | Status: DC
  Administered 2011-04-13 – 2011-04-16 (×7): 100 mg via ORAL
  Filled 2011-04-13 (×8): qty 1

## 2011-04-13 MED ORDER — CEFAZOLIN SODIUM-DEXTROSE 2-3 GM-% IV SOLR
2.0000 g | Freq: Once | INTRAVENOUS | Status: AC
  Administered 2011-04-13: 2 g via INTRAVENOUS

## 2011-04-13 MED ORDER — ACETAMINOPHEN 325 MG PO TABS
650.0000 mg | ORAL_TABLET | Freq: Four times a day (QID) | ORAL | Status: DC | PRN
  Filled 2011-04-13: qty 2

## 2011-04-13 MED ORDER — ACETAMINOPHEN 10 MG/ML IV SOLN
1000.0000 mg | Freq: Once | INTRAVENOUS | Status: AC
  Administered 2011-04-13: 1000 mg via INTRAVENOUS

## 2011-04-13 MED ORDER — ONDANSETRON HCL 4 MG/2ML IJ SOLN
4.0000 mg | Freq: Four times a day (QID) | INTRAMUSCULAR | Status: DC | PRN
  Filled 2011-04-13 (×3): qty 2

## 2011-04-13 MED ORDER — MIDAZOLAM HCL 5 MG/5ML IJ SOLN
INTRAMUSCULAR | Status: DC | PRN
  Administered 2011-04-13: 2 mg via INTRAVENOUS

## 2011-04-13 MED ORDER — SODIUM CHLORIDE 0.9 % IR SOLN
Status: DC | PRN
  Administered 2011-04-13: 3000 mL

## 2011-04-13 MED ORDER — NALOXONE HCL 0.4 MG/ML IJ SOLN: 0.4000 mg | INTRAMUSCULAR | Status: DC | PRN

## 2011-04-13 MED ORDER — BUPIVACAINE 0.25 % ON-Q PUMP SINGLE CATH 300ML: 300.0000 mL | INJECTION | Status: DC

## 2011-04-13 MED ORDER — FLEET ENEMA 7-19 GM/118ML RE ENEM: 1.0000 | ENEMA | Freq: Once | RECTAL | Status: AC | PRN

## 2011-04-13 MED ORDER — METHOCARBAMOL 100 MG/ML IJ SOLN
500.0000 mg | Freq: Four times a day (QID) | INTRAVENOUS | Status: DC | PRN
  Administered 2011-04-13: 500 mg via INTRAVENOUS
  Filled 2011-04-13: qty 5

## 2011-04-13 MED ORDER — BISACODYL 10 MG RE SUPP: 10.0000 mg | Freq: Every day | RECTAL | Status: DC | PRN

## 2011-04-13 MED ORDER — ONDANSETRON HCL 4 MG/2ML IJ SOLN
INTRAMUSCULAR | Status: DC | PRN
  Administered 2011-04-13: 4 mg via INTRAVENOUS

## 2011-04-13 MED ORDER — AMLODIPINE BESYLATE 5 MG PO TABS
5.0000 mg | ORAL_TABLET | Freq: Every day | ORAL | Status: DC
Start: 2011-04-14 — End: 2011-04-16
  Administered 2011-04-14 – 2011-04-16 (×3): 5 mg via ORAL
  Filled 2011-04-13 (×3): qty 1

## 2011-04-13 MED ORDER — ONDANSETRON HCL 4 MG PO TABS: 4.0000 mg | ORAL_TABLET | Freq: Four times a day (QID) | ORAL | Status: DC | PRN

## 2011-04-13 MED ORDER — PANTOPRAZOLE SODIUM 40 MG PO TBEC
40.0000 mg | DELAYED_RELEASE_TABLET | Freq: Every day | ORAL | Status: DC
  Administered 2011-04-13: 40 mg via ORAL
  Filled 2011-04-13 (×2): qty 1

## 2011-04-13 MED ORDER — OXYCODONE HCL 5 MG PO TABS
5.0000 mg | ORAL_TABLET | ORAL | Status: DC | PRN
  Administered 2011-04-13 – 2011-04-16 (×15): 10 mg via ORAL
  Filled 2011-04-13 (×16): qty 2

## 2011-04-13 MED ORDER — DIPHENHYDRAMINE HCL 12.5 MG/5ML PO ELIX: 12.5000 mg | ORAL_SOLUTION | Freq: Four times a day (QID) | ORAL | Status: DC | PRN

## 2011-04-13 MED ORDER — LACTATED RINGERS IV SOLN
INTRAVENOUS | Status: DC | PRN
  Administered 2011-04-13 (×3): via INTRAVENOUS

## 2011-04-13 MED ORDER — MORPHINE SULFATE (PF) 1 MG/ML IV SOLN
INTRAVENOUS | Status: DC
  Administered 2011-04-13: 18 mg via INTRAVENOUS
  Administered 2011-04-13: 10:00:00 via INTRAVENOUS
  Administered 2011-04-13: 7 mg via INTRAVENOUS
  Administered 2011-04-13: 17:00:00 via INTRAVENOUS
  Administered 2011-04-13: 4 mg via INTRAVENOUS
  Administered 2011-04-14: 9 mg via INTRAVENOUS
  Filled 2011-04-13 (×3): qty 25

## 2011-04-13 MED ORDER — BUPIVACAINE ON-Q PAIN PUMP (FOR ORDER SET NO CHG)
INJECTION | Status: DC
  Filled 2011-04-13: qty 1

## 2011-04-13 MED ORDER — MENTHOL 3 MG MT LOZG
1.0000 | LOZENGE | OROMUCOSAL | Status: DC | PRN
  Filled 2011-04-13: qty 9

## 2011-04-13 MED ORDER — FENTANYL CITRATE 0.05 MG/ML IJ SOLN
INTRAMUSCULAR | Status: DC | PRN
  Administered 2011-04-13: 50 ug via INTRAVENOUS

## 2011-04-13 MED ORDER — PROMETHAZINE HCL 25 MG/ML IJ SOLN: 6.2500 mg | INTRAMUSCULAR | Status: DC | PRN

## 2011-04-13 MED ORDER — KETAMINE HCL 10 MG/ML IJ SOLN
INTRAMUSCULAR | Status: DC | PRN
  Administered 2011-04-13 (×2): 10 mg via INTRAVENOUS

## 2011-04-13 MED ORDER — PROPOFOL 10 MG/ML IV EMUL
INTRAVENOUS | Status: DC | PRN
  Administered 2011-04-13: 50 ug/kg/min via INTRAVENOUS

## 2011-04-13 MED ORDER — KCL IN DEXTROSE-NACL 20-5-0.9 MEQ/L-%-% IV SOLN
INTRAVENOUS | Status: DC
  Administered 2011-04-13 – 2011-04-14 (×3): via INTRAVENOUS
  Filled 2011-04-13 (×4): qty 1000

## 2011-04-13 MED ORDER — METOCLOPRAMIDE HCL 5 MG/ML IJ SOLN: 5.0000 mg | Freq: Three times a day (TID) | INTRAMUSCULAR | Status: DC | PRN

## 2011-04-13 MED ORDER — SODIUM CHLORIDE 0.9 % IV SOLN: INTRAVENOUS | Status: DC

## 2011-04-13 MED ORDER — POLYETHYLENE GLYCOL 3350 17 G PO PACK
17.0000 g | PACK | Freq: Every day | ORAL | Status: DC | PRN
  Filled 2011-04-13: qty 1

## 2011-04-13 MED ORDER — PROPOFOL 10 MG/ML IV EMUL
INTRAVENOUS | Status: DC | PRN
  Administered 2011-04-13: 20 mg via INTRAVENOUS

## 2011-04-13 MED ORDER — METOCLOPRAMIDE HCL 10 MG PO TABS: 5.0000 mg | ORAL_TABLET | Freq: Three times a day (TID) | ORAL | Status: DC | PRN

## 2011-04-13 MED ORDER — LACTATED RINGERS IV SOLN: INTRAVENOUS | Status: DC

## 2011-04-13 MED ORDER — DIPHENHYDRAMINE HCL 50 MG/ML IJ SOLN: 12.5000 mg | Freq: Four times a day (QID) | INTRAMUSCULAR | Status: DC | PRN

## 2011-04-13 MED ORDER — ACETAMINOPHEN 650 MG RE SUPP: 650.0000 mg | Freq: Four times a day (QID) | RECTAL | Status: DC | PRN

## 2011-04-13 MED ORDER — ACETAMINOPHEN 10 MG/ML IV SOLN
1000.0000 mg | Freq: Four times a day (QID) | INTRAVENOUS | Status: AC
  Administered 2011-04-13 – 2011-04-14 (×4): 1000 mg via INTRAVENOUS
  Filled 2011-04-13 (×4): qty 100

## 2011-04-13 MED ORDER — HYDROMORPHONE HCL PF 1 MG/ML IJ SOLN: 0.2500 mg | INTRAMUSCULAR | Status: DC | PRN

## 2011-04-13 MED ORDER — SODIUM CHLORIDE 0.9 % IJ SOLN: 9.0000 mL | INTRAMUSCULAR | Status: DC | PRN

## 2011-04-13 MED ORDER — CEFAZOLIN SODIUM 1-5 GM-% IV SOLN
1.0000 g | Freq: Four times a day (QID) | INTRAVENOUS | Status: AC
  Administered 2011-04-13 – 2011-04-14 (×3): 1 g via INTRAVENOUS
  Filled 2011-04-13 (×4): qty 50

## 2011-04-13 MED ORDER — PHENOL 1.4 % MT LIQD
1.0000 | OROMUCOSAL | Status: DC | PRN
  Filled 2011-04-13: qty 177

## 2011-04-13 SURGICAL SUPPLY — 55 items
BAG SPEC THK2 15X12 ZIP CLS (MISCELLANEOUS) ×1
BAG ZIPLOCK 12X15 (MISCELLANEOUS) ×2 IMPLANT
BANDAGE ELASTIC 6 VELCRO ST LF (GAUZE/BANDAGES/DRESSINGS) ×2 IMPLANT
BANDAGE ESMARK 6X9 LF (GAUZE/BANDAGES/DRESSINGS) ×1 IMPLANT
BLADE SAG 18X100X1.27 (BLADE) ×2 IMPLANT
BLADE SAW SGTL 11.0X1.19X90.0M (BLADE) ×2 IMPLANT
BNDG CMPR 9X6 STRL LF SNTH (GAUZE/BANDAGES/DRESSINGS) ×1
BNDG ESMARK 6X9 LF (GAUZE/BANDAGES/DRESSINGS) ×2
BOWL SMART MIX CTS (DISPOSABLE) ×2 IMPLANT
CATH KIT ON-Q SILVERSOAK 5 (CATHETERS) ×1 IMPLANT
CATH KIT ON-Q SILVERSOAK 5IN (CATHETERS) ×2 IMPLANT
CEMENT HV SMART SET (Cement) ×4 IMPLANT
CLOSURE STERI STRIP 1/2 X4 (GAUZE/BANDAGES/DRESSINGS) ×1 IMPLANT
CLOTH BEACON ORANGE TIMEOUT ST (SAFETY) ×2 IMPLANT
CUFF TOURN SGL QUICK 34 (TOURNIQUET CUFF) ×2
CUFF TRNQT CYL 34X4X40X1 (TOURNIQUET CUFF) ×1 IMPLANT
DRAPE EXTREMITY T 121X128X90 (DRAPE) ×2 IMPLANT
DRAPE POUCH INSTRU U-SHP 10X18 (DRAPES) ×2 IMPLANT
DRAPE U-SHAPE 47X51 STRL (DRAPES) ×2 IMPLANT
DRSG ADAPTIC 3X8 NADH LF (GAUZE/BANDAGES/DRESSINGS) ×2 IMPLANT
DRSG PAD ABDOMINAL 8X10 ST (GAUZE/BANDAGES/DRESSINGS) ×1 IMPLANT
DURAPREP 26ML APPLICATOR (WOUND CARE) ×2 IMPLANT
ELECT REM PT RETURN 9FT ADLT (ELECTROSURGICAL) ×2
ELECTRODE REM PT RTRN 9FT ADLT (ELECTROSURGICAL) ×1 IMPLANT
EVACUATOR 1/8 PVC DRAIN (DRAIN) ×2 IMPLANT
FACESHIELD LNG OPTICON STERILE (SAFETY) ×10 IMPLANT
GLOVE BIO SURGEON STRL SZ7.5 (GLOVE) ×2 IMPLANT
GLOVE BIO SURGEON STRL SZ8 (GLOVE) ×2 IMPLANT
GLOVE BIOGEL PI IND STRL 8 (GLOVE) ×2 IMPLANT
GLOVE BIOGEL PI INDICATOR 8 (GLOVE) ×2
GOWN STRL NON-REIN LRG LVL3 (GOWN DISPOSABLE) ×2 IMPLANT
GOWN STRL REIN XL XLG (GOWN DISPOSABLE) ×2 IMPLANT
HANDPIECE INTERPULSE COAX TIP (DISPOSABLE) ×2
IMMOBILIZER KNEE 20 (SOFTGOODS) ×2
IMMOBILIZER KNEE 20 THIGH 36 (SOFTGOODS) ×1 IMPLANT
KIT BASIN OR (CUSTOM PROCEDURE TRAY) ×2 IMPLANT
MANIFOLD NEPTUNE II (INSTRUMENTS) ×2 IMPLANT
NS IRRIG 1000ML POUR BTL (IV SOLUTION) ×2 IMPLANT
PACK TOTAL JOINT (CUSTOM PROCEDURE TRAY) ×2 IMPLANT
PAD ABD 7.5X8 STRL (GAUZE/BANDAGES/DRESSINGS) ×2 IMPLANT
PADDING CAST COTTON 6X4 STRL (CAST SUPPLIES) ×6 IMPLANT
PADDING WEBRIL 6 STERILE (GAUZE/BANDAGES/DRESSINGS) ×1 IMPLANT
POSITIONER SURGICAL ARM (MISCELLANEOUS) ×2 IMPLANT
SET HNDPC FAN SPRY TIP SCT (DISPOSABLE) ×1 IMPLANT
SPONGE GAUZE 4X4 12PLY (GAUZE/BANDAGES/DRESSINGS) ×2 IMPLANT
STRIP CLOSURE SKIN 1/2X4 (GAUZE/BANDAGES/DRESSINGS) ×4 IMPLANT
SUCTION FRAZIER 12FR DISP (SUCTIONS) ×2 IMPLANT
SUT MNCRL AB 4-0 PS2 18 (SUTURE) ×2 IMPLANT
SUT PDS AB 1 CT1 27 (SUTURE) ×6 IMPLANT
SUT VIC AB 2-0 CT1 27 (SUTURE) ×6
SUT VIC AB 2-0 CT1 TAPERPNT 27 (SUTURE) ×3 IMPLANT
TOWEL OR 17X26 10 PK STRL BLUE (TOWEL DISPOSABLE) ×4 IMPLANT
TRAY FOLEY CATH 14FRSI W/METER (CATHETERS) ×2 IMPLANT
WATER STERILE IRR 1500ML POUR (IV SOLUTION) ×2 IMPLANT
WRAP KNEE MAXI GEL POST OP (GAUZE/BANDAGES/DRESSINGS) ×4 IMPLANT

## 2011-04-13 NOTE — Op Note (Signed)
Pre-operative diagnosis- Osteoarthritis  Left knee(s)  Post-operative diagnosis- Osteoarthritis Left knee(s)  Procedure-  Left  Total Knee Arthroplasty  Surgeon- Michelle Rankin. Henery Betzold, MD  Assistant- Avel Peace, PA-C   Anesthesia-  Spinal EBL-* No blood loss amount entered *  Drains Hemovac  Tourniquet time-  Total Tourniquet Time Documented: Thigh (Left) - 43 minutes   Complications- None  Condition-PACU - hemodynamically stable.   Brief Clinical Note  Michelle Sanchez is a 56 y.o. year old female with end stage OA of her left knee with progressively worsening pain and dysfunction. She has constant pain, with activity and at rest and significant functional deficits with difficulties even with ADLs. She has had extensive non-op management including analgesics, injections of cortisone and viscosupplements, and home exercise program, but remains in significant pain with significant dysfunction. Radiographs show bone on bone arthritis. She presents now for left Total Knee Arthroplasty.    Procedure in detail---   The patient is brought into the operating room and positioned supine on the operating table. After successful administration of  Spinal,   a tourniquet is placed high on the  Left thigh(s) and the lower extremity is prepped and draped in the usual sterile fashion. Time out is performed by the operating team and then the  Left lower extremity is wrapped in Esmarch, knee flexed and the tourniquet inflated to 300 mmHg.       A midline incision is made with a ten blade through the subcutaneous tissue to the level of the extensor mechanism. A fresh blade is used to make a medial parapatellar arthrotomy. Soft tissue over the proximal medial tibia is subperiosteally elevated to the joint line with a knife and into the semimembranosus bursa with a Cobb elevator. Soft tissue over the proximal lateral tibia is elevated with attention being paid to avoiding the patellar tendon on the tibial  tubercle. The patella is everted, knee flexed 90 degrees and the ACL and PCL are removed. Findings are bone on bone medial and patellofemoral with large medial and patellar osteophytes.        The drill is used to create a starting hole in the distal femur and the canal is thoroughly irrigated with sterile saline to remove the fatty contents. The 5 degree Left  valgus alignment guide is placed into the femoral canal and the distal femoral cutting block is pinned to remove 11 mm off the distal femur. Resection is made with an oscillating saw.      The tibia is subluxed forward and the menisci are removed. The extramedullary alignment guide is placed referencing proximally at the medial aspect of the tibial tubercle and distally along the second metatarsal axis and tibial crest. The block is pinned to remove 2mm off the more deficient medial  side. Resection is made with an oscillating saw. Size 3is the most appropriate size for the tibia and the proximal tibia is prepared with the modular drill and keel punch for that size.      The femoral sizing guide is placed and size 3 is most appropriate. Rotation is marked off the epicondylar axis and confirmed by creating a rectangular flexion gap at 90 degrees. The size 3 cutting block is pinned in this rotation and the anterior, posterior and chamfer cuts are made with the oscillating saw. The intercondylar block is then placed and that cut is made.      Trial size 3 tibial component, trial size 3 posterior stabilized femur and a 15  mm  posterior stabilized rotating platform insert trial is placed. Full extension is achieved with excellent varus/valgus and anterior/posterior balance throughout full range of motion. The patella is everted and thickness measured to be 24  mm. Free hand resection is taken to 14 mm, a 38 template is placed, lug holes are drilled, trial patella is placed, and it tracks normally. Osteophytes are removed off the posterior femur with the trial  in place. All trials are removed and the cut bone surfaces prepared with pulsatile lavage. Cement is mixed and once ready for implantation, the size 3 tibial implant, size  3 posterior stabilized femoral component, and the size 38 patella are cemented in place and the patella is held with the clamp. The trial insert is placed and the knee held in full extension. All extruded cement is removed and once the cement is hard the permanent 15 mm posterior stabilized rotating platform insert is placed into the tibial tray.      The wound is copiously irrigated with saline solution and the extensor mechanism closed over a hemovac drain with #1 PDS suture. The tourniquet is released for a total tourniquet time of 43  minutes. Flexion against gravity is 135 degrees and the patella tracks normally. Subcutaneous tissue is closed with 2.0 vicryl and subcuticular with running 4.0 Monocryl. The catheter for the Marcaine pain pump is placed and the pump is initiated. The incision is cleaned and dried and steri-strips and a bulky sterile dressing are applied. The limb is placed into a knee immobilizer and the patient is awakened and transported to recovery in stable condition.      Please note that a surgical assistant was a medical necessity for this procedure in order to perform it in a safe and expeditious manner. Surgical assistant was necessary to retract the ligaments and vital neurovascular structures to prevent injury to them and also necessary for proper positioning of the limb to allow for anatomic placement of the prosthesis.   Michelle Rankin Shirleen Mcfaul, MD    04/13/2011, 8:32 AM

## 2011-04-13 NOTE — Transfer of Care (Signed)
Immediate Anesthesia Transfer of Care Note  Patient: Michelle Sanchez  Procedure(s) Performed:  TOTAL KNEE ARTHROPLASTY  Patient Location: PACU  Anesthesia Type: Regional  Level of Consciousness: awake, alert , oriented, patient cooperative and responds to stimulation  Airway & Oxygen Therapy: Patient Spontanous Breathing and Patient connected to face mask oxygen  Post-op Assessment: Report given to PACU RN and Post -op Vital signs reviewed and stable  Post vital signs: Reviewed and stable  Complications: No apparent anesthesia complications

## 2011-04-13 NOTE — Anesthesia Postprocedure Evaluation (Signed)
  Anesthesia Post-op Note  Patient: Michelle Sanchez  Procedure(s) Performed:  TOTAL KNEE ARTHROPLASTY  Patient Location: PACU  Anesthesia Type: Spinal  Level of Consciousness: awake and alert   Airway and Oxygen Therapy: Patient Spontanous Breathing  Post-op Pain: mild  Post-op Assessment: Post-op Vital signs reviewed, Patient's Cardiovascular Status Stable, Respiratory Function Stable, Patent Airway and No signs of Nausea or vomiting  Post-op Vital Signs: stable  Complications: No apparent anesthesia complications

## 2011-04-13 NOTE — H&P (View-Only) (Signed)
Michelle Sanchez  DOB: 06/02/1955 Single / Language: English / Race: White / Female  Date of Admission:  04/13/2011  Chief Complaint:  Left Knee Pain  History of Present Illness The patient is a 55 year old female who comes in today for a preoperative History and Physical. The patient is scheduled for a left total knee arthroplasty to be performed by Dr. Frank V. Aluisio, MD at [facility] on 04/13/2011. They have been treated conservatively in the past for the above stated problem and despite conservative measures, they continue to have progressive pain and severe functional limitations and dysfunction. They have failed non-operative management including home exercise, medications, and injections. It is felt that they would benefit from undergoing total joint replacement. Risks and benefits of the procedure have been discussed with the patient and they elect to proceed with surgery. There are no active contraindications to surgery such as ongoing infection or rapidly progressive neurological disease.  Allergies Voltaren - Hives. Intolerance Septra - Diarrhea.  Problem List/Past Medical Migraine Headache Sleep Apnea. uses CPAP Hypertension Osteoarthrosis NOS, lower leg (715.96). 07/29/2010 Total knee replacement status (V43.65)  Past Surgical History Total Knee Replacement - Right. Date: 06/23/2010. Arthroscopic Knee Surgery - Both. multiple procedures  Family History Father. Deceased, Cancer. age 56 Mother. Living, Heart disease. Lung problems, age 81  Social History Tobacco use Alcohol use. Occasional alcohol use. Marital status. Single. Living situation. Lives with caregiver. boyfriend Current work status. Retired. Post-Surgical Plans. Plans to go home.  Medication History Omeprazole (20MG Capsule DR, Oral daily) Active. AmLODIPine Besylate (5MG Tablet, Oral daily) Active. Meloxicam (15MG Tablet, Oral daily) Active. Vitamin ( Oral daily)  Active.  Review of Systems General:Not Present- Chills, Fever, Night Sweats, Fatigue, Weight Gain, Weight Loss and Memory Loss. Skin:Not Present- Hives, Itching, Rash, Eczema and Lesions. HEENT:Not Present- Tinnitus, Headache, Double Vision, Visual Loss, Hearing Loss and Dentures. Respiratory:Not Present- Shortness of breath with exertion, Shortness of breath at rest, Allergies, Coughing up blood and Chronic Cough. Cardiovascular:Not Present- Chest Pain, Racing/skipping heartbeats, Difficulty Breathing Lying Down, Murmur, Swelling and Palpitations. Gastrointestinal:Not Present- Bloody Stool, Heartburn, Abdominal Pain, Vomiting, Nausea, Constipation, Diarrhea, Difficulty Swallowing, Jaundice and Loss of appetitie. Female Genitourinary:Not Present- Blood in Urine, Urinary frequency, Weak urinary stream, Discharge, Flank Pain, Incontinence, Painful Urination, Urgency, Urinary Retention and Urinating at Night. Musculoskeletal:Present- Joint Swelling, Joint Pain and Back Pain. Not Present- Muscle Weakness, Muscle Pain, Morning Stiffness and Spasms. Neurological:Not Present- Tremor, Dizziness, Blackout spells, Paralysis, Difficulty with balance and Weakness. Psychiatric:Not Present- Insomnia.  Vitals Weight: 293 lb Height: 68 in Body Surface Area: 2.53 m Body Mass Index: 44.55 kg/m Pulse: 100 (Regular) Resp.: 12 (Unlabored) BP: 124/76 (Sitting, Left Arm, Standard)  Physical Exam The physical exam findings are as follows:  Patient is a 55 year old female with continued left knee pain.  General Mental Status - Alert, cooperative and good historian. General Appearance- pleasant. Not in acute distress. Orientation- Oriented X3. Build & Nutrition- Well nourished and Well developed.  Head and Neck Head- normocephalic, atraumatic . Neck Global Assessment- supple. no bruit auscultated on the right and no bruit auscultated on the left.  Eye Pupil- Bilateral-  Regular and Round. Note: reading glasses Motion- Bilateral- EOMI.  Chest and Lung Exam Auscultation: Breath sounds:- clear at anterior chest wall and - clear at posterior chest wall. Adventitious sounds:- No Adventitious sounds.  Cardiovascular Auscultation:Rhythm- Regular rate and rhythm. Heart Sounds- S1 WNL and S2 WNL. Murmurs & Other Heart Sounds:Auscultation of the heart reveals -   No Murmurs.  Abdomen Palpation/Percussion:Tenderness- Abdomen is non-tender to palpation. Rigidity (guarding)- Abdomen is soft. Auscultation:Auscultation of the abdomen reveals - Bowel sounds normal.  Female Genitourinary Not done, not pertinent to present illness  Musculoskeletal On exam she is in no distress. Her left knee shows no swelling. Her range of motion is about 0 to 110 to 115 degrees. There is no instability noted about the left knee. The right knee has significant varus, range 10 to 100 with marked crepitus on range of motion, tender medial greater than lateral with no instability noted.  RADIOGRAPHS: AP and lateral of both knees show the left knee prosthesis in excellent position with no periprosthetic abnormality. Right knee bone on bone degenerative change medial and patellofemoral compartments with varus deformity, marginal osteophytes and subluxation of the tibia and femur.  Assessment & Plan Osteoarthritis Left Knee  Patient is for a Left Total Knee Replacement by Dr. Aluisio.  Plan is to go home after the surgery.  PCP - Dr. Valerie Leschber - Patient has been seen by Dr. Leschber and it is felt she is stable for surgery.  Drew Mousa Prout, PA-C    

## 2011-04-13 NOTE — Preoperative (Signed)
Beta Blockers   Reason not to administer Beta Blockers:Not Applicable 

## 2011-04-13 NOTE — Interval H&P Note (Signed)
History and Physical Interval Note:  04/13/2011 6:55 AM  Michelle Sanchez  has presented today for surgery, with the diagnosis of Osteoarthritis of the Left Knee  The various methods of treatment have been discussed with the patient and family. After consideration of risks, benefits and other options for treatment, the patient has consented to  Procedure(s): TOTAL KNEE ARTHROPLASTY as a surgical intervention .  The patients' history has been reviewed, patient examined, no change in status, stable for surgery.  I have reviewed the patients' chart and labs.  Questions were answered to the patient's satisfaction.     Loanne Drilling

## 2011-04-13 NOTE — Anesthesia Procedure Notes (Signed)
Spinal  Patient location during procedure: OR Start time: 04/13/2011 7:15 AM End time: 04/13/2011 7:26 AM Reason for block: post-op pain management Staffing Anesthesiologist: Ronelle Nigh L Performed by: anesthesiologist  Preanesthetic Checklist Completed: patient identified, site marked, surgical consent, pre-op evaluation, timeout performed, IV checked, risks and benefits discussed and monitors and equipment checked Spinal Block Patient position: sitting Prep: Betadine Patient monitoring: heart rate, continuous pulse ox and blood pressure Injection technique: single-shot Needle Needle type: Whitacre and Introducer  Needle gauge: 25 G Needle length: 15 cm Assessment Sensory level: T10 Events: paresthesia Additional Notes Expiration date of kit checked and confirmed. Patient tolerated procedure well, without complications.

## 2011-04-14 LAB — CBC
HCT: 36.2 % (ref 36.0–46.0)
Hemoglobin: 11.8 g/dL — ABNORMAL LOW (ref 12.0–15.0)
WBC: 10.2 10*3/uL (ref 4.0–10.5)

## 2011-04-14 LAB — BASIC METABOLIC PANEL
BUN: 7 mg/dL (ref 6–23)
Chloride: 101 mEq/L (ref 96–112)
GFR calc Af Amer: >90 mL/min (ref >90)
Glucose, Bld: 136 mg/dL — ABNORMAL HIGH (ref 70–99)
Potassium: 3.9 mEq/L (ref 3.5–5.1)

## 2011-04-14 MED ORDER — NON FORMULARY: 20.0000 mg | Freq: Every day | Status: DC

## 2011-04-14 MED ORDER — HYDROMORPHONE HCL PF 1 MG/ML IJ SOLN
0.5000 mg | INTRAMUSCULAR | Status: DC | PRN
  Administered 2011-04-14 (×4): 1 mg via INTRAVENOUS
  Filled 2011-04-14 (×4): qty 1

## 2011-04-14 MED ORDER — OMEPRAZOLE 20 MG PO CPDR
20.0000 mg | DELAYED_RELEASE_CAPSULE | Freq: Every day | ORAL | Status: DC
  Administered 2011-04-14 – 2011-04-16 (×3): 20 mg via ORAL
  Filled 2011-04-14 (×3): qty 1

## 2011-04-14 NOTE — Evaluation (Signed)
Occupational Therapy Evaluation Patient Details Name: Michelle Sanchez MRN: [id number] DOB: 12-Dec-1955 Today's Date: 04/14/2011 EV1 [id number] Problem List:  Patient Active Problem List  Diagnoses  . Hypertension  . Migraines  . GERD (gastroesophageal reflux disease)  . Osteoarthritis, knee    Past Medical History:  Past Medical History  Diagnosis Date  . GERD (gastroesophageal reflux disease)   . Hypertension   . Migraines   . Sleep apnea 04-07-11    uses cpap nightly  . Arthritis 04-07-11     knees   Past Surgical History:  Past Surgical History  Procedure Date  . Knee surgery     both knees  . Knee replacement 04-07-11    06-23-10 right    OT Assessment/Plan/Recommendation OT Assessment Clinical Impression Statement: Pt limited by pain POD#1 TKR. Pt has all necessary DME and prn A from previous knee sx. No f/u OT needed. OT Recommendation/Assessment: Patient does not need any further OT services OT Recommendation Follow Up Recommendations: No OT follow up Equipment Recommended: None recommended by OT OT Goals    OT Evaluation Precautions/Restrictions  Precautions Precautions: Knee Required Braces or Orthoses: Yes Knee Immobilizer: Discontinue once straight leg raise with < 10 degree lag Restrictions Weight Bearing Restrictions: Yes Prior Functioning Home Living Lives With: Spouse Receives Help From: Family Type of Home: House Home Layout: One level Home Access: Stairs to enter Entrance Stairs-Rails: Can reach both Entrance Stairs-Number of Steps: 3 Bathroom Shower/Tub: Engineer, manufacturing systems: Standard Home Adaptive Equipment: Bedside commode/3-in-1;Tub transfer bench;Walker - rolling Prior Function Level of Independence: Independent with basic ADLs;Independent with transfers;Independent with homemaking with ambulation;Independent with gait Able to Take Stairs?: Yes Driving: Yes ADL ADL Grooming: Simulated;Set up Where Assessed - Grooming:  Sitting, chair;Unsupported Upper Body Bathing: Simulated;Set up Where Assessed - Upper Body Bathing: Sitting, chair;Unsupported Lower Body Bathing: Simulated;Minimal assistance Where Assessed - Lower Body Bathing: Sit to stand from chair Upper Body Dressing: Simulated;Set up Where Assessed - Upper Body Dressing: Sitting, chair;Unsupported Lower Body Dressing: Simulated;Moderate assistance Where Assessed - Lower Body Dressing: Sit to stand from chair Toilet Transfer: Simulated;Minimal assistance Toilet Transfer Method: Stand pivot Toilet Transfer Equipment: Other (comment) (sim BTB) Toileting - Clothing Manipulation: Simulated;Minimal assistance Where Assessed - Glass blower/designer Manipulation: Standing Toileting - Hygiene: Simulated;Minimal assistance Where Assessed - Toileting Hygiene: Standing Tub/Shower Transfer: Not assessed Tub/Shower Transfer Method: Not assessed ADL Comments: Pt limited by pain at this time. Vision/Perception    Cognition Cognition Arousal/Alertness: Awake/alert Overall Cognitive Status: Appears within functional limits for tasks assessed Orientation Level: Oriented X4 Sensation/Coordination Sensation Light Touch: Appears Intact Extremity Assessment RUE Assessment RUE Assessment: Within Functional Limits LUE Assessment LUE Assessment: Within Functional Limits Mobility  Bed Mobility Bed Mobility: Yes Supine to Sit: With rails;HOB elevated (Comment degrees);2: Max assist Supine to Sit Details (indicate cue type and reason): pt had diffificulty turning trunk on bed to swing legs over, HOB raised Sit to Supine: 4: Min assist;HOB flat;3: Mod assist Transfers Sit to Stand: 4: Min assist;3: Mod assist;With upper extremity assist;From chair/3-in-1 Sit to Stand Details (indicate cue type and reason): vcs for hand placement Stand to Sit: 4: Min assist;With upper extremity assist;To bed Stand to Sit Details: vcs for hand placement Exercises  End of  Session OT - End of Session Equipment Utilized During Treatment: Other (comment) (RW) Activity Tolerance: Patient limited by pain Patient left: in bed;with call bell in reach General Behavior During Session: [facility] for tasks performed Cognition: [facility] for tasks performed   Amber Guthridge  A, OTR/L [id number] 04/14/2011, 2:11 PM

## 2011-04-14 NOTE — Progress Notes (Signed)
Subjective: 1 Day Post-Op Procedure(s) (LRB): TOTAL KNEE ARTHROPLASTY (Left) Patient reports pain as mild and moderate.   Patient seen in rounds with Dr. Lequita Halt. Patient has complaints of rough night, not much sleep We will start therapy today. Plan is to go home after hospital stay.  Objective: Vital signs in last 24 hours: Temp:  [96.8 F (36 C)-98.1 F (36.7 C)] 98 F (36.7 C) (02/12 0620) Pulse Rate:  [65-95] 92  (02/12 0620) Resp:  [10-18] 16  (02/12 0620) BP: (98-139)/(51-89) 126/82 mmHg (02/12 0620) SpO2:  [95 %-100 %] 95 % (02/12 0620) FiO2 (%):  [2 %-97 %] 97 % (02/12 0400) Weight:  [128.46 kg (283 lb 3.3 oz)] 128.46 kg (283 lb 3.3 oz) (02/11 1156)  Intake/Output from previous day:  Intake/Output Summary (Last 24 hours) at 04/14/11 0752 Last data filed at 04/14/11 4098  Gross per 24 hour  Intake   2180 ml  Output   5055 ml  Net  -2875 ml    Intake/Output this shift:    Labs:  Basename 04/14/11 0418  HGB 11.8*    Basename 04/14/11 0418  WBC 10.2  RBC 4.28  HCT 36.2  PLT 260    Basename 04/14/11 0418  NA 137  K 3.9  CL 101  CO2 28  BUN 7  CREATININE 0.63  GLUCOSE 136*  CALCIUM 8.6   No results found for this basename: LABPT:2,INR:2 in the last 72 hours  Exam - Neurovascular intact Sensation intact distally Dressing - clean, dry, no drainage Motor function intact - moving foot and toes well on exam.  Hemovac pulled without difficulty.  Past Medical History  Diagnosis Date  . GERD (gastroesophageal reflux disease)   . Hypertension   . Migraines   . Sleep apnea 04-07-11    uses cpap nightly  . Arthritis 04-07-11     knees    Assessment/Plan: 1 Day Post-Op Procedure(s) (LRB): TOTAL KNEE ARTHROPLASTY (Left) Active Problems:  * No active hospital problems. *    Advance diet Up with therapy Continue foley due to strict I&O Discharge home with home health  DVT Prophylaxis - Xarelto  Protocol Weight-Bearing as tolerated to left  leg Keep foley until tomorrow. No vaccines. D/C PCA Morphine, Change to IV push Dilaudid D/C O2 and Pulse OX and try on Room [address]  Michelle Sanchez 04/14/2011, 7:52 AM

## 2011-04-14 NOTE — Evaluation (Signed)
Physical Therapy Evaluation Patient Details Name: Michelle Sanchez MRN: [id number] DOB: 1955-05-20 Today's Date: 04/14/2011  Problem List:  Patient Active Problem List  Diagnoses  . Hypertension  . Migraines  . GERD (gastroesophageal reflux disease)  . Osteoarthritis, knee    Past Medical History:  Past Medical History  Diagnosis Date  . GERD (gastroesophageal reflux disease)   . Hypertension   . Migraines   . Sleep apnea 04-07-11    uses cpap nightly  . Arthritis 04-07-11     knees   Past Surgical History:  Past Surgical History  Procedure Date  . Knee surgery     both knees  . Knee replacement 04-07-11    06-23-10 right    PT Assessment/Plan/Recommendation PT Assessment Clinical Impression Statement: pt is s/p LTKA, is having incr. in pain, tolerated oivot only. Pt wll benefit from PT to improve functional mobility, rom , strength  to DC to home.BP after pivot 136/89 PT Recommendation/Assessment: Patient will need skilled PT in the acute care venue PT Problem List: Decreased strength;Decreased range of motion;Decreased activity tolerance;Decreased knowledge of use of DME;Pain;Decreased knowledge of precautions PT Therapy Diagnosis : Difficulty walking;Acute pain PT Plan PT Frequency: 7X/week PT Treatment/Interventions: DME instruction;Gait training;Stair training;Functional mobility training;Therapeutic activities;Therapeutic exercise;Patient/family education PT Recommendation Recommendations for Other Services: OT consult Follow Up Recommendations: Home health PT Equipment Recommended: None recommended by PT PT Goals  Acute Rehab PT Goals PT Goal Formulation: With patient Time For Goal Achievement: 7 days Pt will go Supine/Side to Sit: with supervision;with HOB 0 degrees PT Goal: Supine/Side to Sit - Progress: Goal set today Pt will go Sit to Supine/Side: with supervision PT Goal: Sit to Supine/Side - Progress: Goal set today Pt will go Sit to Stand: with  supervision PT Goal: Sit to Stand - Progress: Goal set today Pt will go Stand to Sit: with supervision PT Goal: Stand to Sit - Progress: Goal set today Pt will Ambulate: 16 - 50 feet;with supervision;with rolling walker PT Goal: Ambulate - Progress: Goal set today Pt will Go Up / Down Stairs: 3-5 stairs;with min assist;with least restrictive assistive device PT Goal: Up/Down Stairs - Progress: Goal set today Pt will Perform Home Exercise Program: with supervision, verbal cues required/provided PT Goal: Perform Home Exercise Program - Progress: Goal set today  PT Evaluation Precautions/Restrictions  Precautions Precautions: Knee Required Braces or Orthoses: Yes Prior Functioning  Home Living Lives With: Spouse Receives Help From: Family Type of Home: House Home Layout: One level Home Access: Stairs to enter Entrance Stairs-Rails: Can reach both Entrance Stairs-Number of Steps: 3 Home Adaptive Equipment: Walker - rolling;Bedside commode/3-in-1 Prior Function Level of Independence: Independent with basic ADLs Able to Take Stairs?: Yes Driving: Yes Cognition Cognition Arousal/Alertness: Awake/alert Overall Cognitive Status: Appears within functional limits for tasks assessed Orientation Level: Oriented X4 Sensation/Coordination Sensation Light Touch: Appears Intact Extremity Assessment RLE Assessment RLE Assessment: Within Functional Limits LLE Assessment LLE Assessment: Exceptions to WFL LLE AROM (degrees) LLE Overall AROM Comments: tolerated very little flexion  LLE Strength LLE Overall Strength Comments: SLR with mod A Mobility (including Balance) Bed Mobility Bed Mobility: Yes Supine to Sit: With rails;HOB elevated (Comment degrees);2: Max assist Supine to Sit Details (indicate cue type and reason): pt had diffificulty turning trunk on bed to swing legs over, HOB raised Transfers Transfers: Yes Sit to Stand: 1: +2 Total assist;From elevated surface;With upper  extremity assist;From bed Sit to Stand Details (indicate cue type and reason): vc to push from bed Stand  to Sit: 1: +2 Total assist;With armrests;To chair/3-in-1 Stand to Sit Details: vc to reach back for armrests, step out [employer] Pivot Transfers: 1: +2 Total assist Stand Pivot Transfer Details (indicate cue type and reason): pt had difficulty with WB on LLE Ambulation/Gait Ambulation/Gait: No  Posture/Postural Control Posture/Postural Control: No significant limitations Exercise  Total Joint Exercises Ankle Circles/Pumps: AROM;Left;10 reps;Supine Quad Sets: AROM;Left;10 reps;Supine Heel Slides: AAROM;Left;10 reps;Supine End of Session PT - End of Session Equipment Utilized During Treatment: Left knee immobilizer Activity Tolerance: Patient limited by pain (dizziness) Patient left: in chair;with call bell in reach Nurse Communication: Mobility status for transfers General Behavior During Session: [facility] for tasks performed Cognition: [facility] for tasks performed  Michelle Sanchez 04/14/2011, 1:59 PM

## 2011-04-14 NOTE — Progress Notes (Signed)
Physical Therapy Treatment Patient Details Name: Michelle Sanchez MRN: [id number] DOB: 07/27/1955 Today's Date: 04/14/2011  PT Assessment/Plan  PT - Assessment/Plan PT Plan: Discharge plan remains appropriate PT Frequency: 7X/week Recommendations for Other Services: OT consult Follow Up Recommendations: Home health PT Equipment Recommended: None recommended by PT PT Goals  Acute Rehab PT Goals PT Goal Formulation: With patient Time For Goal Achievement: 7 days Pt will go Supine/Side to Sit: with supervision;with HOB 0 degrees PT Goal: Supine/Side to Sit - Progress: Goal set today Pt will go Sit to Supine/Side: with supervision PT Goal: Sit to Supine/Side - Progress: Progressing toward goal Pt will go Sit to Stand: with supervision PT Goal: Sit to Stand - Progress: Progressing toward goal Pt will go Stand to Sit: with supervision PT Goal: Stand to Sit - Progress: Progressing toward goal Pt will Ambulate: 16 - 50 feet;with supervision;with rolling walker PT Goal: Ambulate - Progress: Goal set today Pt will Go Up / Down Stairs: 3-5 stairs;with min assist;with least restrictive assistive device PT Goal: Up/Down Stairs - Progress: Goal set today Pt will Perform Home Exercise Program: with supervision, verbal cues required/provided PT Goal: Perform Home Exercise Program - Progress: Goal set today  PT Treatment Precautions/Restrictions  Precautions Precautions: Knee Required Braces or Orthoses: Yes Knee Immobilizer: Discontinue once straight leg raise with < 10 degree lag Restrictions Weight Bearing Restrictions: Yes Mobility (including Balance) Bed Mobility Bed Mobility: Yes  Sit to Supine: HOB flat;3: Mod assist Sit to Supine - Details (indicate cue type and reason): assist for LLE onto bed Transfers Transfers: Yes Sit to Stand: 1: +2 Total assist;With armrests;From chair/3-in-1 Sit to Stand Details (indicate cue type and reason): pt is very groggy, had to make 2 attempts to  stand at [facility] with pt=70 Stand to Sit: 4: Min assist;With upper extremity assist;To bed Stand to Sit Details: vcs for hand placement Stand Pivot Transfers: 1: +2 Total assist Stand Pivot Transfer Details (indicate cue type and reason): pt= 60%, pt is drowsy  from meds, pain is 8 Ambulation/Gait Ambulation/Gait: No  Posture/Postural Control Posture/Postural Control: No significant limitations Exercise  End of Session PT - End of Session Equipment Utilized During Treatment: Left knee immobilizer Activity Tolerance: Patient limited by pain Patient left: in bed;with call bell in reach Nurse Communication: Mobility status for transfers General Behavior During Session: [facility] for tasks performed Cognition: [facility] for tasks performed  [name] 04/14/2011, 2:25 PM

## 2011-04-14 NOTE — Progress Notes (Signed)
Report from [place], Charity fundraiser. Pt seen, dsg and vascular assessment completed. Pt in CPM, reports 10/10 pain to rt knee and thigh.  Pt medicated per PRN orders. Pt states she would like to try to remain on the CPM, instructed to call if she would like to come off. Callbell and bedside table are in reach. Will monitor.

## 2011-04-15 DIAGNOSIS — E871 Hypo-osmolality and hyponatremia: Secondary | ICD-10-CM | POA: Diagnosis not present

## 2011-04-15 LAB — CBC
HCT: 34.5 % — ABNORMAL LOW (ref 36.0–46.0)
MCHC: 32.5 g/dL (ref 30.0–36.0)
Platelets: 251 10*3/uL (ref 150–400)
RDW: 14.2 % (ref 11.5–15.5)
WBC: 11.2 10*3/uL — ABNORMAL HIGH (ref 4.0–10.5)

## 2011-04-15 LAB — BASIC METABOLIC PANEL
Chloride: 98 mEq/L (ref 96–112)
GFR calc Af Amer: >90 mL/min (ref >90)
GFR calc non Af Amer: >90 mL/min (ref >90)
Potassium: 3.6 mEq/L (ref 3.5–5.1)
Sodium: 133 mEq/L — ABNORMAL LOW (ref 135–145)

## 2011-04-15 MED ORDER — RIVAROXABAN 10 MG PO TABS: 10.0000 mg | ORAL_TABLET | Freq: Every day | ORAL | Status: DC

## 2011-04-15 MED ORDER — OXYCODONE HCL 5 MG PO TABS: 5.0000 mg | ORAL_TABLET | ORAL | Status: DC | PRN

## 2011-04-15 MED ORDER — METHOCARBAMOL 500 MG PO TABS: 500.0000 mg | ORAL_TABLET | Freq: Four times a day (QID) | ORAL | Status: DC | PRN

## 2011-04-15 NOTE — Progress Notes (Signed)
  CARE MANAGEMENT NOTE 04/15/2011  Patient:  Michelle Sanchez, Michelle Sanchez   Account Number:  [id number]  Date Initiated:  04/15/2011  Documentation initiated by:  Colleen Can  Subjective/Objective Assessment:   dx osteoarthritis left knee: total knee replacemnt     Action/Plan:   CM spoke with patient regarding discharge plans. Patient plans to go back to her home in [place], [place] where spousewill be caregiver. She already HAS dme. CHOICE OFFERED, she wishes to use [facility] who she used the last time she had surgery.   Anticipated DC Date:  04/16/2011   Anticipated DC Plan:  HOME W HOME HEALTH SERVICES  In-house referral  NA      DC Planning Services  CM consult      [facility] Choice  HOME HEALTH   Choice offered to / List presented to:  C-1 Patient   DME arranged  NA      DME agency  NA     HH arranged  HH-2 PT      HH agency  [facility]   Status of service:  In process, will continue to follow Medicare Important Message given?  NO (If response is "NO", the following Medicare IM given date fields will be blank) Date Medicare IM given:   Date Additional Medicare IM given:    Discharge Disposition:    Per UR Regulation:    Comments:  CM called [facility] and they were unable to take case. Advised Patient and she wishes to use [facility], CM spoke with [facility] rep and they can service patient. List of agencies placed on shadow chart.

## 2011-04-15 NOTE — Progress Notes (Signed)
Pt stated that she would not like assistance she would place herself on CPAP. RT advised pt to call if she needed any assistance.

## 2011-04-15 NOTE — Discharge Instructions (Signed)
Knee Rehabilitation, Guidelines Following Surgery Results after knee surgery are often greatly improved when you follow the exercise, range of motion and muscle strengthening exercises prescribed by your doctor. Safety measures are also important to protect the knee from further injury. Any time any of these exercises cause you to have increased pain or swelling in your knee joint, decrease the amount until you are comfortable again and slowly increase them. If you have problems or questions, call your caregiver or physical therapist for advice. HOME CARE INSTRUCTIONS   Remove items at home which could result in a fall. This includes throw rugs or furniture in walking pathways.   Continue medications as instructed.   You may shower or take tub baths when your staples or stitches are removed or as instructed.   Walk using crutches or walker as instructed.   Put weight on your legs and walk as much as is comfortable.   You may resume a sexual relationship in one month or when given the OK by your doctor.   Return to work as instructed by your doctor.   Do not drive a car for 6 weeks or as instructed.   Wear elastic stockings until instructed not to.   Make sure you keep all of your appointments after your operation with all of your doctors and caregivers.  RANGE OF MOTION AND STRENGTHENING EXERCISES Rehabilitation of the knee is important following a knee injury or an operation. After just a few days of immobilization, the muscles of the thigh which control the knee become weakened and shrink (atrophy). Knee exercises are designed to build up the tone and strength of the thigh muscles and to improve knee motion. Often times heat used for twenty to thirty minutes before working out will loosen up your tissues and help with improving the range of motion. These exercises can be done on a training (exercise) mat, on the floor, on a table or on a bed. Use what ever works the best and is most  comfortable for you Knee exercises include:  Leg Lifts - While your knee is still immobilized in a splint or cast, you can do straight leg raises. Lift the leg to 60 degrees, hold for 3 sec, and slowly lower the leg. Repeat 10-20 times 2-3 times daily. Perform this exercise against resistance later as your knee gets better.   Quad and Hamstring Sets - Tighten up the muscle on the front of the thigh (Quad) and hold for 5-10 sec. Repeat this 10-20 times hourly. Hamstring sets are done by pushing the foot backward against an object and holding for 5-10 sec. Repeat as with quad sets.   A rehabilitation program following serious knee injuries can speed recovery and prevent re-injury in the future due to weakened muscles. Contact your doctor or a physical therapist for more information on knee rehabilitation. MAKE SURE YOU:   Understand these instructions.   Will watch your condition.   Will get help right away if you are not doing well or get worse.  Document Released: 02/16/2005 Document Revised: 10/29/2010 Document Reviewed: 08/06/2006 [employer] Patient Information 2012 [employer]  Pick up stool softner and laxative for home. Do not submerge incision under water. May shower. Continue to use ice for pain and swelling from surgery.  

## 2011-04-15 NOTE — Progress Notes (Signed)
Iv left wrist infiltrated site irritated patient c/o of it hurting . Iv discontinued

## 2011-04-15 NOTE — Progress Notes (Signed)
Subjective: 2 Days Post-Op Procedure(s) (LRB): TOTAL KNEE ARTHROPLASTY (Left) Patient reports pain as mild and moderate.   Patient seen in rounds with Dr. Lequita Halt. Patient has complaints of some pain but little better this morning.  Plan is to go home.   Objective: Vital signs in last 24 hours: Temp:  [97.5 F (36.4 C)-99.6 F (37.6 C)] 99.6 F (37.6 C) (02/13 0500) Pulse Rate:  [90-111] 104  (02/13 0500) Resp:  [16] 16  (02/13 0500) BP: (126-136)/(77-89) 132/83 mmHg (02/13 0500) SpO2:  [90 %-99 %] 90 % (02/13 0500)  Intake/Output from previous day:  Intake/Output Summary (Last 24 hours) at 04/15/11 1014 Last data filed at 04/15/11 0900  Gross per 24 hour  Intake   1200 ml  Output   3025 ml  Net  -1825 ml    Intake/Output this shift: Total I/O In: 240 [P.O.:240] Out: -   Labs:  Basename 04/15/11 0333 04/14/11 0418  HGB 11.2* 11.8*    Basename 04/15/11 0333 04/14/11 0418  WBC 11.2* 10.2  RBC 4.05 4.28  HCT 34.5* 36.2  PLT 251 260    Basename 04/15/11 0333 04/14/11 0418  NA 133* 137  K 3.6 3.9  CL 98 101  CO2 28 28  BUN 6 7  CREATININE 0.57 0.63  GLUCOSE 129* 136*  CALCIUM 8.8 8.6   No results found for this basename: LABPT:2,INR:2 in the last 72 hours  Exam - Neurovascular intact Sensation intact distally Dressing/Incision - clean, dry, no drainage Motor function intact - moving foot and toes well on exam.   Past Medical History  Diagnosis Date  . GERD (gastroesophageal reflux disease)   . Hypertension   . Migraines   . Sleep apnea 04-07-11    uses cpap nightly  . Arthritis 04-07-11     knees    Assessment/Plan: 2 Days Post-Op Procedure(s) (LRB): TOTAL KNEE ARTHROPLASTY (Left) Active Problems:  Postop Hyponatremia   Advance diet Up with therapy Plan for discharge tomorrow Discharge home with home health  DVT Prophylaxis - Xarelto  Protocol Weight-Bearing as tolerated to left leg  Jennife Zaucha 04/15/2011, 10:14 AM

## 2011-04-15 NOTE — Progress Notes (Signed)
Physical Therapy Treatment Patient Details Name: Michelle Sanchez MRN: [id number] DOB: 04/13/1955 Today's Date: 04/15/2011  PT Assessment/Plan  PT - Assessment/Plan Comments on Treatment Session: pt able to progress to ambulation today, slight decr. in pain of 7/10. PT Plan: Discharge plan remains appropriate PT Frequency: 7X/week Recommendations for Other Services: OT consult Follow Up Recommendations: Home health PT Equipment Recommended: None recommended by PT PT Goals  Acute Rehab PT Goals PT Goal Formulation: With patient Time For Goal Achievement: 7 days Pt will go Supine/Side to Sit: with supervision;with HOB 0 degrees PT Goal: Supine/Side to Sit - Progress: Progressing toward goal Pt will go Sit to Stand: with supervision PT Goal: Sit to Stand - Progress: Progressing toward goal Pt will go Stand to Sit: with supervision PT Goal: Stand to Sit - Progress: Progressing toward goal Pt will Ambulate: 16 - 50 feet;with supervision;with rolling walker PT Goal: Ambulate - Progress: Progressing toward goal Pt will Perform Home Exercise Program: with supervision, verbal cues required/provided PT Goal: Perform Home Exercise Program - Progress: Progressing toward goal  PT Treatment Precautions/Restrictions  Precautions Precautions: Knee Required Braces or Orthoses: Yes Knee Immobilizer: Discontinue once straight leg raise with < 10 degree lag Restrictions Weight Bearing Restrictions: No Mobility (including Balance) Bed Mobility Supine to Sit: 2: Max assist;With rails;HOB elevated (Comment degrees) Supine to Sit Details (indicate cue type and reason): pt has difficulty getting to upright requires assist Transfers Sit to Stand: 1: +2 Total assist;From elevated surface;From bed;With upper extremity assist Sit to Stand Details (indicate cue type and reason): vc to push from bed, pt=70% Stand to Sit: To chair/3-in-1;4: Min assist;With upper extremity assist;With armrests Stand to Sit  Details: vc to place RLE forward Ambulation/Gait Ambulation/Gait: Yes Ambulation/Gait Assistance: 1: +2 Total assist Ambulation/Gait Assistance Details (indicate cue type and reason): vc for sequence +1 for safety, pt=85% Ambulation Distance (Feet): 50 Feet Assistive device: Rolling walker    Exercise  Total Joint Exercises Ankle Circles/Pumps: AROM;Left;10 reps;Supine Quad Sets: AROM;10 reps;Left;Supine Heel Slides: AAROM;Left;10 reps;Supine Hip ABduction/ADduction: AAROM;Left;10 reps Straight Leg Raises: AAROM;Left;10 reps;Supine End of Session PT - End of Session Equipment Utilized During Treatment: Left knee immobilizer Activity Tolerance: Patient tolerated treatment well Patient left: in chair;with call bell in reach Nurse Communication: Mobility status for transfers General Behavior During Session: [facility] for tasks performed  [name] 04/15/2011, 11:09 AM

## 2011-04-16 LAB — CBC
HCT: 33.3 % — ABNORMAL LOW (ref 36.0–46.0)
Hemoglobin: 10.9 g/dL — ABNORMAL LOW (ref 12.0–15.0)
RBC: 3.92 MIL/uL (ref 3.87–5.11)
WBC: 11.3 10*3/uL — ABNORMAL HIGH (ref 4.0–10.5)

## 2011-04-16 NOTE — Discharge Summary (Signed)
Physician Discharge Summary   Patient ID: EDLA PARA MRN: [id number] DOB/AGE: 1956/01/12 56 y.o.  Admit date: 04/13/2011 Discharge date: 04/16/2011  Primary Diagnosis: Osteoarthritis Left Knee  Admission Diagnoses: Past Medical History  Diagnosis Date  . GERD (gastroesophageal reflux disease)   . Hypertension   . Migraines   . Sleep apnea 04-07-11    uses cpap nightly  . Arthritis 04-07-11     knees   Discharge Diagnoses:  Active Problems:  Postop Hyponatremia  Procedure: Procedure(s) (LRB): TOTAL KNEE ARTHROPLASTY (Left)   Consults: None  HPI: Michelle Sanchez is a 56 y.o. year old female with end stage OA of her left knee with progressively worsening pain and dysfunction. She has constant pain, with activity and at rest and significant functional deficits with difficulties even with ADLs. She has had extensive non-op management including analgesics, injections of cortisone and viscosupplements, and home exercise program, but remains in significant pain with significant dysfunction. Radiographs show bone on bone arthritis. She presents now for left Total Knee Arthroplasty.   Laboratory Data: Hospital Outpatient Visit on 04/07/2011  Component Date Value Range Status  . MRSA, PCR  04/07/2011 NEGATIVE  NEGATIVE Final  . Staphylococcus aureus  04/07/2011 NEGATIVE  NEGATIVE Final   Comment:                                 The Xpert SA Assay (FDA                          approved for NASAL specimens                          only), is one component of                          a comprehensive surveillance                          program.  It is not intended                          to diagnose infection nor to                          guide or monitor treatment.  Marland Kitchen aPTT (seconds) 04/07/2011 34  24-37 Final  . WBC (K/uL) 04/07/2011 7.8  4.0-10.5 Final  . RBC (MIL/uL) 04/07/2011 4.92  3.87-5.11 Final  . Hemoglobin (g/dL) 40/98/1191 47.8  29.5-62.1 Final  . HCT (%) 04/07/2011  41.9  36.0-46.0 Final  . MCV (fL) 04/07/2011 85.2  78.0-100.0 Final  . MCH (pg) 04/07/2011 28.7  26.0-34.0 Final  . MCHC (g/dL) 30/86/5784 69.6  29.5-28.4 Final  . RDW (%) 04/07/2011 14.0  11.5-15.5 Final  . Platelets (K/uL) 04/07/2011 285  150-400 Final  . Sodium (mEq/L) 04/07/2011 140  135-145 Final  . Potassium (mEq/L) 04/07/2011 3.8  3.5-5.1 Final  . Chloride (mEq/L) 04/07/2011 103  96-112 Final  . CO2 (mEq/L) 04/07/2011 26  19-32 Final  . Glucose, Bld (mg/dL) 13/24/4010 95  27-25 Final  . BUN (mg/dL) 36/64/4034 12  7-42 Final  . Creatinine, Ser (mg/dL) 59/56/3875 6.43  3.29-5.18 Final  . Calcium (mg/dL) 84/16/6063 01.6  0.1-09.3 Final  . Total Protein (g/dL)  04/07/2011 8.2  6.0-8.3 Final  . Albumin (g/dL) 78/29/5621 4.1  3.0-8.6 Final  . AST (U/L) 04/07/2011 19  0-37 Final  . ALT (U/L) 04/07/2011 23  0-35 Final  . Alkaline Phosphatase (U/L) 04/07/2011 104  39-117 Final  . Total Bilirubin (mg/dL) 57/84/6962 0.3  9.5-2.8 Final  . GFR calc non Af Amer (mL/min) 04/07/2011 >90  >90 Final  . GFR calc Af Amer (mL/min) 04/07/2011 >90  >90 Final   Comment:                                 The eGFR has been calculated                          using the CKD EPI equation.                          This calculation has not been                          validated in all clinical                          situations.                          eGFR's persistently                          <90 mL/min signify                          possible Chronic Kidney Disease.  Marland Kitchen Prothrombin Time (seconds) 04/07/2011 13.4  11.6-15.2 Final  . INR  04/07/2011 1.00  0.00-1.49 Final  . Color, Urine  04/07/2011 YELLOW  YELLOW Final  . APPearance  04/07/2011 CLEAR  CLEAR Final  . Specific Gravity, Urine  04/07/2011 1.008  1.005-1.030 Final  . pH  04/07/2011 6.0  5.0-8.0 Final  . Glucose, UA (mg/dL) 41/32/4401 NEGATIVE  NEGATIVE Final  . Hgb urine dipstick  04/07/2011 NEGATIVE  NEGATIVE Final  . Bilirubin Urine   04/07/2011 NEGATIVE  NEGATIVE Final  . Ketones, ur (mg/dL) 02/72/5366 NEGATIVE  NEGATIVE Final  . Protein, ur (mg/dL) 44/04/4740 NEGATIVE  NEGATIVE Final  . Urobilinogen, UA (mg/dL) 59/56/3875 0.2  6.4-3.3 Final  . Nitrite  04/07/2011 NEGATIVE  NEGATIVE Final  . Leukocytes, UA  04/07/2011 NEGATIVE  NEGATIVE Final   MICROSCOPIC NOT DONE ON URINES WITH NEGATIVE PROTEIN, BLOOD, LEUKOCYTES, NITRITE, OR GLUCOSE <1000 mg/dL.    Basename 04/16/11 0410 04/15/11 0333 04/14/11 0418  HGB 10.9* 11.2* 11.8*    Basename 04/16/11 0410 04/15/11 0333  WBC 11.3* 11.2*  RBC 3.92 4.05  HCT 33.3* 34.5*  PLT 253 251    Basename 04/15/11 0333 04/14/11 0418  NA 133* 137  K 3.6 3.9  CL 98 101  CO2 28 28  BUN 6 7  CREATININE 0.57 0.63  GLUCOSE 129* 136*  CALCIUM 8.8 8.6   No results found for this basename: LABPT:2,INR:2 in the last 72 hours  X-Rays:Dg Chest 2 View  04/07/2011  *RADIOLOGY REPORT*  Clinical Data: Preop for left knee surgery  CHEST - 2 VIEW  Comparison: None.  Findings: The lungs are clear.  Mediastinal contours  appear normal. The heart is within normal limits in size.  There are mild degenerative changes in the thoracic spine.  IMPRESSION: No active lung disease.  Original Report Authenticated By: Juline Patch, M.D.    EKG: Orders placed in visit on 02/18/11  . EKG 12-LEAD     Hospital Course: Patient was admitted to [facility] and taken to the OR and underwent the above state procedure without complications.  Patient tolerated the procedure well and was later transferred to the recovery room and then to the orthopaedic floor for postoperative care.  They were given PO and IV analgesics for pain control following their surgery.  They were given 24 hours of postoperative antibiotics and started on DVT prophylaxis.   PT and OT were ordered for total joint protocol.  Discharge planning consulted to help with postop disposition and equipment needs.  Patient had a rough night on  the evening of surgery and not much sleep but started to get up with therapy on day one.  PCA was discontinued and they were weaned over to PO meds.  Hemovac drain was pulled without difficulty.  Continued to progress with therapy into day two walking around 50 feet.  Dressing was changed on day two and the incision was healing well.  By day three, the patient had progressed with therapy and meeting goals.  Incision was healing well.  Patient was seen in rounds and was ready to go home.  Discharge Medications: Prior to Admission medications   Medication Sig Start Date End Date Taking? Authorizing Provider  amLODipine (NORVASC) 5 MG tablet Take 5 mg by mouth daily before breakfast. 02/18/11 02/18/12 Yes Rene Paci, MD  MAGNESIUM PO Take 1 tablet by mouth daily.   Yes Historical Provider, MD  omeprazole (PRILOSEC) 20 MG capsule Take 20 mg by mouth daily before breakfast. 02/18/11  Yes Rene Paci, MD  methocarbamol (ROBAXIN) 500 MG tablet Take 1 tablet (500 mg total) by mouth every 6 (six) hours as needed. 04/15/11 04/25/11  Jimmy Plessinger, PA  oxyCODONE (OXY IR/ROXICODONE) 5 MG immediate release tablet Take 1-2 tablets (5-10 mg total) by mouth every 3 (three) hours as needed. 04/15/11 04/25/11  Quantrell Splitt Julien Girt, PA  rivaroxaban (XARELTO) 10 MG TABS tablet Take 1 tablet (10 mg total) by mouth daily with breakfast. 04/15/11   Addisyn Leclaire Julien Girt, PA    Diet: heart healthy  Activity:WBAT  Follow-up:in 2 weeks  Disposition: Home Discharged Condition: good   Discharge Orders    Future Appointments: Provider: Department: Dept Phone: Center:   05/28/2011 11:00 AM Rene Paci, MD [facility] [phone] [facility]     Future Orders Please Complete By Expires   Diet - low sodium heart healthy      Diet - low sodium heart healthy      Call MD / Call 911      Comments:   If you experience chest pain or shortness of breath, CALL 911 and be transported to the hospital emergency room.  If  you develope a fever above 101 F, pus (white drainage) or increased drainage or redness at the wound, or calf pain, call your surgeon's office.   Constipation Prevention      Comments:   Drink plenty of fluids.  Prune juice may be helpful.  You may use a stool softener, such as Colace (over the counter) 100 mg twice a day.  Use MiraLax (over the counter) for constipation as needed.   Increase activity slowly as tolerated  Weight Bearing as taught in Physical Therapy      Comments:   Use a walker or crutches as instructed.   Discharge instructions      Comments:   Pick up stool softner and laxative for home. Do not submerge incision under water. May shower. Continue to use ice for pain and swelling from surgery.    Driving restrictions      Comments:   No driving   Lifting restrictions      Comments:   No lifting   TED hose      Comments:   Use stockings (TED hose) for 3 weeks on both leg(s).  You may remove them at night for sleeping.   Change dressing      Comments:   Change dressing daily with sterile 4 x 4 inch gauze dressing and apply TED hose.   Do not put a pillow under the knee. Place it under the heel.      Call MD / Call 911      Comments:   If you experience chest pain or shortness of breath, CALL 911 and be transported to the hospital emergency room.  If you develope a fever above 101 F, pus (white drainage) or increased drainage or redness at the wound, or calf pain, call your surgeon's office.   Constipation Prevention      Comments:   Drink plenty of fluids.  Prune juice may be helpful.  You may use a stool softener, such as Colace (over the counter) 100 mg twice a day.  Use MiraLax (over the counter) for constipation as needed.   Increase activity slowly as tolerated      Weight Bearing as taught in Physical Therapy      Comments:   Use a walker or crutches as instructed.   Discharge instructions      Comments:   Pick up stool softner and laxative for  home. Do not submerge incision under water. May shower. Continue to use ice for pain and swelling from surgery.    Driving restrictions      Comments:   No driving   Lifting restrictions      Comments:   No lifting   TED hose      Comments:   Use stockings (TED hose) for 3 weeks on both leg(s).  You may remove them at night for sleeping.   Change dressing      Comments:   Change dressing daily with sterile 4 x 4 inch gauze dressing and apply TED hose.  You may clean the incision with alcohol prior to redressing.   Do not put a pillow under the knee. Place it under the heel.        Medication List  As of 04/16/2011  9:07 AM   STOP taking these medications         aspirin 81 MG tablet      ibuprofen 200 MG tablet      meloxicam 15 MG tablet         TAKE these medications         amLODipine 5 MG tablet   Commonly known as: NORVASC   Take 5 mg by mouth daily before breakfast.      MAGNESIUM PO   Take 1 tablet by mouth daily.      methocarbamol 500 MG tablet   Commonly known as: ROBAXIN   Take 1 tablet (500 mg total) by mouth every 6 (six) hours as needed.  omeprazole 20 MG capsule   Commonly known as: PRILOSEC   Take 20 mg by mouth daily before breakfast.      oxyCODONE 5 MG immediate release tablet   Commonly known as: Oxy IR/ROXICODONE   Take 1-2 tablets (5-10 mg total) by mouth every 3 (three) hours as needed.      rivaroxaban 10 MG Tabs tablet   Commonly known as: XARELTO   Take 1 tablet (10 mg total) by mouth daily with breakfast.           Follow-up Information    Follow up with Loanne Drilling, MD. Schedule an appointment as soon as possible for a visit in 2 weeks.   Contact information:   [facility] [address] [place] [address] [phone]          Signed: Patrica Duel 04/16/2011, 9:07 AM

## 2011-04-16 NOTE — Progress Notes (Signed)
  CARE MANAGEMENT NOTE 04/16/2011  Patient:  Michelle Sanchez, Michelle Sanchez   Account Number:  [id number]  Date Initiated:  04/15/2011  Documentation initiated by:  Colleen Can  Subjective/Objective Assessment:   dx osteoarthritis left knee: total knee replacemnt     Action/Plan:   CM spoke with patient regarding discharge plans. Patient plans to go back to her home in [place], [place] where spousewill be caregiver. She already HAS dme. CHOICE OFFERED, she wishes to use [facility] who she used the last time she had surgery.   Anticipated DC Date:  04/16/2011   Anticipated DC Plan:  HOME W HOME HEALTH SERVICES  In-house referral  NA      DC Planning Services  CM consult      [facility] Choice  HOME HEALTH   Choice offered to / List presented to:  C-1 Patient   DME arranged  NA      DME agency  NA     HH arranged  HH-2 PT      HH agency  [facility]   Status of service:  Completed, signed off Medicare Important Message given?  NO (If response is "NO", the following Medicare IM given date fields will be blank) Date Medicare IM given:   Date Additional Medicare IM given:    Discharge Disposition:  HOME W HOME HEALTH SERVICES  Per UR Regulation:    Comments:  04/16/2011 Kieryn Burtis, rn bsn ccm [phone] PT FOR DISCHARGE TODAY. [facility] NOTIFIED; HH SERVICES TO START TOMORROW

## 2011-04-16 NOTE — Progress Notes (Signed)
Subjective: 3 Days Post-Op Procedure(s) (LRB): TOTAL KNEE ARTHROPLASTY (Left) Patient reports pain as mild.   Patient seen in rounds with Dr. Lequita Halt. Patient is doing better today. Ready to go home.  Objective: Vital signs in last 24 hours: Temp:  [98 F (36.7 C)-99 F (37.2 C)] 98.6 F (37 C) (02/14 0530) Pulse Rate:  [94-111] 94  (02/14 0530) Resp:  [16] 16  (02/14 0530) BP: (123-128)/(73-82) 126/82 mmHg (02/14 0530) SpO2:  [94 %-95 %] 94 % (02/14 0530)  Intake/Output from previous day:  Intake/Output Summary (Last 24 hours) at 04/16/11 0903 Last data filed at 04/16/11 0858  Gross per 24 hour  Intake   1080 ml  Output   4200 ml  Net  -3120 ml    Intake/Output this shift: Total I/O In: 240 [P.O.:240] Out: 600 [Urine:600]  Labs: Results for orders placed during the hospital encounter of 04/13/11  TYPE AND SCREEN      Component Value Range   ABO/RH(D) O POS     Antibody Screen NEG     Sample Expiration 04/16/2011    CBC      Component Value Range   WBC 10.2  4.0 - 10.5 (K/uL)   RBC 4.28  3.87 - 5.11 (MIL/uL)   Hemoglobin 11.8 (*) 12.0 - 15.0 (g/dL)   HCT 69.6  29.5 - 28.4 (%)   MCV 84.6  78.0 - 100.0 (fL)   MCH 27.6  26.0 - 34.0 (pg)   MCHC 32.6  30.0 - 36.0 (g/dL)   RDW 13.2  44.0 - 10.2 (%)   Platelets 260  150 - 400 (K/uL)  BASIC METABOLIC PANEL      Component Value Range   Sodium 137  135 - 145 (mEq/L)   Potassium 3.9  3.5 - 5.1 (mEq/L)   Chloride 101  96 - 112 (mEq/L)   CO2 28  19 - 32 (mEq/L)   Glucose, Bld 136 (*) 70 - 99 (mg/dL)   BUN 7  6 - 23 (mg/dL)   Creatinine, Ser 7.25  0.50 - 1.10 (mg/dL)   Calcium 8.6  8.4 - 36.6 (mg/dL)   GFR calc non Af Amer >90  >90 (mL/min)   GFR calc Af Amer >90  >90 (mL/min)  CBC      Component Value Range   WBC 11.2 (*) 4.0 - 10.5 (K/uL)   RBC 4.05  3.87 - 5.11 (MIL/uL)   Hemoglobin 11.2 (*) 12.0 - 15.0 (g/dL)   HCT 44.0 (*) 34.7 - 46.0 (%)   MCV 85.2  78.0 - 100.0 (fL)   MCH 27.7  26.0 - 34.0 (pg)   MCHC  32.5  30.0 - 36.0 (g/dL)   RDW 42.5  95.6 - 38.7 (%)   Platelets 251  150 - 400 (K/uL)  BASIC METABOLIC PANEL      Component Value Range   Sodium 133 (*) 135 - 145 (mEq/L)   Potassium 3.6  3.5 - 5.1 (mEq/L)   Chloride 98  96 - 112 (mEq/L)   CO2 28  19 - 32 (mEq/L)   Glucose, Bld 129 (*) 70 - 99 (mg/dL)   BUN 6  6 - 23 (mg/dL)   Creatinine, Ser 5.64  0.50 - 1.10 (mg/dL)   Calcium 8.8  8.4 - 33.2 (mg/dL)   GFR calc non Af Amer >90  >90 (mL/min)   GFR calc Af Amer >90  >90 (mL/min)  CBC      Component Value Range   WBC 11.3 (*) 4.0 -  10.5 (K/uL)   RBC 3.92  3.87 - 5.11 (MIL/uL)   Hemoglobin 10.9 (*) 12.0 - 15.0 (g/dL)   HCT 16.1 (*) 09.6 - 46.0 (%)   MCV 84.9  78.0 - 100.0 (fL)   MCH 27.8  26.0 - 34.0 (pg)   MCHC 32.7  30.0 - 36.0 (g/dL)   RDW 04.5  40.9 - 81.1 (%)   Platelets 253  150 - 400 (K/uL)    Exam: Neurovascular intact Sensation intact distally Incision - clean, dry, no drainage Motor function intact - moving foot and toes well on exam.   Assessment/Plan: 3 Days Post-Op Procedure(s) (LRB): TOTAL KNEE ARTHROPLASTY (Left) Procedure(s) (LRB): TOTAL KNEE ARTHROPLASTY (Left) Past Medical History  Diagnosis Date  . GERD (gastroesophageal reflux disease)   . Hypertension   . Migraines   . Sleep apnea 04-07-11    uses cpap nightly  . Arthritis 04-07-11     knees   Active Problems:  Postop Hyponatremia   Up with therapy Discharge home with home health Diet - heart healthy Follow up - in 2 weeks Activity - WBAT Condition Upon Discharge - Good D/C Meds - See DC Summary DVT Prophylaxis - Xarelto Protocol   Gram Siedlecki 04/16/2011, 9:03 AM

## 2011-04-16 NOTE — Progress Notes (Signed)
Physical Therapy Treatment Patient Details Name: Michelle Sanchez MRN: [id number] DOB: 1955/08/26 Today's Date: 04/16/2011  PT Assessment/Plan  PT - Assessment/Plan PT Plan: Discharge plan remains appropriate PT Frequency: 7X/week Recommendations for Other Services: OT consult Follow Up Recommendations: Home health PT Equipment Recommended: None recommended by PT PT Goals  Acute Rehab PT Goals PT Goal Formulation: With patient Time For Goal Achievement: 7 days Pt will go Supine/Side to Sit: with supervision;with HOB 0 degrees PT Goal: Supine/Side to Sit - Progress: Progressing toward goal Pt will go Sit to Supine/Side: with supervision PT Goal: Sit to Supine/Side - Progress: Progressing toward goal Pt will go Sit to Stand: with supervision PT Goal: Sit to Stand - Progress: Progressing toward goal Pt will go Stand to Sit: with supervision PT Goal: Stand to Sit - Progress: Progressing toward goal Pt will Ambulate: 16 - 50 feet;with supervision;with rolling walker PT Goal: Ambulate - Progress: Progressing toward goal Pt will Go Up / Down Stairs: 3-5 stairs;with min assist;with least restrictive assistive device PT Goal: Up/Down Stairs - Progress: Progressing toward goal Pt will Perform Home Exercise Program: with supervision, verbal cues required/provided PT Goal: Perform Home Exercise Program - Progress: Progressing toward goal  PT Treatment Precautions/Restrictions  Precautions Precautions: Knee Required Braces or Orthoses: Yes Knee Immobilizer: Discontinue once straight leg raise with < 10 degree lag Restrictions Weight Bearing Restrictions: No Mobility (including Balance) Bed Mobility Supine to Sit: 4: Min assist;With rails Supine to Sit Details (indicate cue type and reason): cues for sequence/technique Transfers Sit to Stand: 4: Min assist;With upper extremity assist;From bed;From chair/3-in-1;With armrests Sit to Stand Details (indicate cue type and reason): cues for  use of UEs Stand to Sit: 5: Supervision;4: Min assist;With upper extremity assist;With armrests;To chair/3-in-1 Stand to Sit Details: cues for LE position Ambulation/Gait Ambulation/Gait Assistance: 5: Supervision Ambulation/Gait Assistance Details (indicate cue type and reason): cues for position from RW Ambulation Distance (Feet): 70 Feet (2x20' and 70') Assistive device: Rolling walker Gait Pattern: Step-to pattern Stairs: Yes Stairs Assistance: 4: Min assist Stairs Assistance Details (indicate cue type and reason): cues for foot placement and sequence Stair Management Technique: One rail Right;Forwards;With crutches;Step to pattern Number of Stairs: 4     Exercise  Total Joint Exercises Ankle Circles/Pumps: AROM;20 reps;Both;Supine Quad Sets: AROM;20 reps;Both;Supine Heel Slides: AAROM;20 reps;Left;Supine Straight Leg Raises: AAROM;20 reps;Supine;Left End of Session PT - End of Session Equipment Utilized During Treatment: Left knee immobilizer;Gait belt Activity Tolerance: Patient tolerated treatment well Patient left: in chair;with call bell in reach Nurse Communication: Mobility status for transfers;Mobility status for ambulation General Behavior During Session: [facility] for tasks performed Cognition: [facility] for tasks performed  Mkenzie Dotts 04/16/2011, 2:39 PM

## 2011-05-04 ENCOUNTER — Encounter (HOSPITAL_COMMUNITY): Payer: Self-pay | Admitting: Orthopedic Surgery

## 2011-05-28 ENCOUNTER — Ambulatory Visit: Payer: 59 | Admitting: Internal Medicine

## 2011-06-01 ENCOUNTER — Ambulatory Visit (INDEPENDENT_AMBULATORY_CARE_PROVIDER_SITE_OTHER): Payer: 59 | Admitting: Internal Medicine

## 2011-06-01 ENCOUNTER — Encounter: Payer: Self-pay | Admitting: Internal Medicine

## 2011-06-01 VITALS — BP 122/82 | HR 100 | Temp 97.3°F | Ht 68.0 in | Wt 282.0 lb

## 2011-06-01 DIAGNOSIS — Z124 Encounter for screening for malignant neoplasm of cervix: Secondary | ICD-10-CM

## 2011-06-01 DIAGNOSIS — Z1239 Encounter for other screening for malignant neoplasm of breast: Secondary | ICD-10-CM

## 2011-06-01 DIAGNOSIS — J309 Allergic rhinitis, unspecified: Secondary | ICD-10-CM | POA: Insufficient documentation

## 2011-06-01 DIAGNOSIS — F411 Generalized anxiety disorder: Secondary | ICD-10-CM

## 2011-06-01 DIAGNOSIS — F418 Other specified anxiety disorders: Secondary | ICD-10-CM

## 2011-06-01 DIAGNOSIS — IMO0002 Reserved for concepts with insufficient information to code with codable children: Secondary | ICD-10-CM

## 2011-06-01 DIAGNOSIS — M1710 Unilateral primary osteoarthritis, unspecified knee: Secondary | ICD-10-CM

## 2011-06-01 DIAGNOSIS — I10 Essential (primary) hypertension: Secondary | ICD-10-CM

## 2011-06-01 MED ORDER — MOMETASONE FUROATE 50 MCG/ACT NA SUSP: 2.0000 | Freq: Every day | NASAL | Status: DC

## 2011-06-01 MED ORDER — MONTELUKAST SODIUM 10 MG PO TABS: 10.0000 mg | ORAL_TABLET | Freq: Every day | ORAL | Status: DC

## 2011-06-01 MED ORDER — FEXOFENADINE HCL 180 MG PO TABS: 180.0000 mg | ORAL_TABLET | Freq: Every day | ORAL | Status: DC

## 2011-06-01 NOTE — Assessment & Plan Note (Signed)
resumed prior tx (on norvasc until 2010) 12/12 The current medical regimen is effective;  continue present plan and medications.  Also continue diet and exercise efforts to control BP and weight  BP Readings from Last 3 Encounters:  06/01/11 122/82  04/16/11 126/82  04/16/11 126/82

## 2011-06-01 NOTE — Assessment & Plan Note (Signed)
s/p R TKR 06/2010, s/p L TKR 04/2011 Continue meloxicam and skelaxin for associated with back spasm and pain

## 2011-06-01 NOTE — Assessment & Plan Note (Signed)
Add singular and daily antihistamine to nasal steroids

## 2011-06-01 NOTE — Progress Notes (Signed)
  Subjective:    [name], female    DOB: 1955/08/21, 56 y.o.   MRN: [id number]  HPI  Here for follow up - reviewed chronic medical issues:  HTN - on tx until 2010, prev stopped same due to successful BP control with weight loss - resumed norvasc 12/12 -the patient reports compliance with medication(s) as prescribed. Denies adverse side effects.  GERD - the patient reports compliance with medication(s) as prescribed. Denies adverse side effects.  OA - L TKR 04/2011 - improving mobility, nocturnal pain requiring medications, otherwise improved compared to preoperative state  allergic rhinitis - chronic nasal steroids and singular prn in past - intermittent OTC antihistamine use - sinus pressure but no fever or pain  Past Medical History  Diagnosis Date  . GERD (gastroesophageal reflux disease)   . Hypertension   . Migraines   . OSA (obstructive sleep apnea)     uses cpap nightly  . Arthritis 04-07-11     knee s/p TKR 2/13   Review of Systems  Constitutional: Negative for fever or weight change.  Respiratory: Negative for cough and shortness of breath.       Objective:   Physical Exam  BP 122/82  Pulse 100  Temp(Src) 97.3 F (36.3 C) (Oral)  Ht 5\' 8"  (1.727 m)  Wt 282 lb (127.914 kg)  BMI 42.88 kg/m2  SpO2 95% Wt Readings from Last 3 Encounters:  06/01/11 282 lb (127.914 kg)  04/13/11 283 lb 3.3 oz (128.46 kg)  04/13/11 283 lb 3.3 oz (128.46 kg)   Constitutional: She is overweight, but appears well-developed and well-nourished. No distress.  Neck: Normal range of motion. Neck supple. No JVD present. No thyromegaly present.  Cardiovascular: Normal rate, regular rhythm and normal heart sounds.  No murmur heard. No BLE edema. Pulmonary/Chest: Effort normal and breath sounds normal. No respiratory distress. She has no wheezes.  Musculoskeletal:L knee - s/p TKR - nontender and no effusion; r knee s/p TKR with normal range of motion, no joint effusions. No  gross deformities Skin: Skin is warm and dry. No rash noted. No erythema.  Psychiatric: She has a normal mood and affect. Her behavior is normal. Judgment and thought content normal.   Lab Results  Component Value Date   WBC 11.3* 04/16/2011   HGB 10.9* 04/16/2011   HCT 33.3* 04/16/2011   PLT 253 04/16/2011   GLUCOSE 129* 04/15/2011   ALT 23 04/07/2011   AST 19 04/07/2011   NA 133* 04/15/2011   K 3.6 04/15/2011   CL 98 04/15/2011   CREATININE 0.57 04/15/2011   BUN 6 04/15/2011   CO2 28 04/15/2011   INR 1.00 04/07/2011        Assessment & Plan:    see problem list. Medications and labs reviewed today.  Situational anxiety. Boyfriend diagnosed with lymphoma December 2012. Support offered, no need for anxiolytic or counseling referral of this time, but will consider same if needed. Patient to call if problems

## 2011-06-01 NOTE — Patient Instructions (Signed)
It was good to see you today. We have reviewed your prior records including labs and tests today Resume daily Singular and Allegra thru the summer in addition to nasal steroid - Your prescription(s) have been submitted to your pharmacy. Please take as directed and contact our office if you believe you are having problem(s) with the medication(s). Good luck with your boyfriend - let us know if you need something for nerves or referral for counseling Please schedule followup in 6 months for physical and labs, call sooner if problems.

## 2011-08-13 ENCOUNTER — Other Ambulatory Visit: Payer: Self-pay | Admitting: Internal Medicine

## 2011-09-25 ENCOUNTER — Other Ambulatory Visit: Payer: Self-pay

## 2011-09-25 MED ORDER — OMEPRAZOLE 20 MG PO CPDR: 20.0000 mg | DELAYED_RELEASE_CAPSULE | Freq: Every day | ORAL | Status: DC

## 2011-09-25 MED ORDER — MELOXICAM 15 MG PO TABS: 15.0000 mg | ORAL_TABLET | Freq: Every day | ORAL | Status: DC

## 2011-11-30 ENCOUNTER — Encounter: Payer: 59 | Admitting: Internal Medicine

## 2012-01-19 ENCOUNTER — Ambulatory Visit (INDEPENDENT_AMBULATORY_CARE_PROVIDER_SITE_OTHER): Payer: 59 | Admitting: Internal Medicine

## 2012-01-19 ENCOUNTER — Other Ambulatory Visit (INDEPENDENT_AMBULATORY_CARE_PROVIDER_SITE_OTHER): Payer: 59

## 2012-01-19 ENCOUNTER — Encounter: Payer: Self-pay | Admitting: Internal Medicine

## 2012-01-19 VITALS — BP 122/84 | HR 87 | Temp 98.2°F | Ht 68.0 in | Wt 292.0 lb

## 2012-01-19 DIAGNOSIS — Z0000 Encounter for general adult medical examination without abnormal findings: Secondary | ICD-10-CM

## 2012-01-19 DIAGNOSIS — M545 Low back pain, unspecified: Secondary | ICD-10-CM

## 2012-01-19 DIAGNOSIS — Z1211 Encounter for screening for malignant neoplasm of colon: Secondary | ICD-10-CM

## 2012-01-19 DIAGNOSIS — Z23 Encounter for immunization: Secondary | ICD-10-CM

## 2012-01-19 DIAGNOSIS — I10 Essential (primary) hypertension: Secondary | ICD-10-CM

## 2012-01-19 LAB — URINALYSIS, ROUTINE W REFLEX MICROSCOPIC
Nitrite: NEGATIVE
Specific Gravity, Urine: >=1.03 (ref 1.000–1.030)
Total Protein, Urine: NEGATIVE
Urine Glucose: NEGATIVE
pH: 6 (ref 5.0–8.0)

## 2012-01-19 LAB — CBC WITH DIFFERENTIAL/PLATELET
Basophils Relative: 0.5 % (ref 0.0–3.0)
Eosinophils Relative: 3.3 % (ref 0.0–5.0)
Lymphocytes Relative: 25.8 % (ref 12.0–46.0)
MCV: 84.9 fl (ref 78.0–100.0)
Monocytes Absolute: 0.6 10*3/uL (ref 0.1–1.0)
Monocytes Relative: 6.9 % (ref 3.0–12.0)
Neutrophils Relative %: 63.5 % (ref 43.0–77.0)
RBC: 4.88 Mil/uL (ref 3.87–5.11)
WBC: 8 10*3/uL (ref 4.5–10.5)

## 2012-01-19 LAB — BASIC METABOLIC PANEL
Chloride: 103 mEq/L (ref 96–112)
Creatinine, Ser: 0.7 mg/dL (ref 0.4–1.2)
GFR: 91.95 mL/min (ref >60.00)

## 2012-01-19 LAB — HEPATIC FUNCTION PANEL
ALT: 15 U/L (ref 0–35)
Albumin: 3.9 g/dL (ref 3.5–5.2)
Alkaline Phosphatase: 93 U/L (ref 39–117)
Total Protein: 7.9 g/dL (ref 6.0–8.3)

## 2012-01-19 LAB — LIPID PANEL
Cholesterol: 221 mg/dL — ABNORMAL HIGH (ref 0–200)
HDL: 51.3 mg/dL (ref >39.00)
VLDL: 24.4 mg/dL (ref 0.0–40.0)

## 2012-01-19 LAB — TSH: TSH: 1.91 u[IU]/mL (ref 0.35–5.50)

## 2012-01-19 MED ORDER — NAPROXEN 500 MG PO TABS: 500.0000 mg | ORAL_TABLET | Freq: Two times a day (BID) | ORAL | Status: DC

## 2012-01-19 MED ORDER — BACLOFEN 10 MG PO TABS: 10.0000 mg | ORAL_TABLET | Freq: Three times a day (TID) | ORAL | Status: DC | PRN

## 2012-01-19 NOTE — Patient Instructions (Signed)
It was good to see you today. Health Maintenance reviewed - Tdap today, refer for colonoscopy - all other recommended immunizations and age-appropriate screenings are up-to-date. we'll make referral to GI for colon screening. Our office will contact you regarding appointment(s) once made. Test(s) ordered today. Your results will be released to MyChart (or called to you) after review, usually within 72hours after test completion. If any changes need to be made, you will be notified at that same time. Use Naprosyn 2x/day with food in place of mobic for next 7 days to help back pain - also Baclofen for muscle spasm -Your prescription(s) have been submitted to your pharmacy. Please take as directed and contact our office if you believe you are having problem(s) with the medication(s). Other Medications reviewed and updated Please schedule followup in 6 months for blood pressure and weight check, call sooner if problems.  Health Maintenance, Females A healthy lifestyle and preventative care can promote health and wellness.  Maintain regular health, dental, and eye exams.   Eat a healthy diet. Foods like vegetables, fruits, whole grains, low-fat dairy products, and lean protein foods contain the nutrients you need without too many calories. Decrease your intake of foods high in solid fats, added sugars, and salt. Get information about a proper diet from your caregiver, if necessary.   Regular physical exercise is one of the most important things you can do for your health. Most adults should get at least 150 minutes of moderate-intensity exercise (any activity that increases your heart rate and causes you to sweat) each week. In addition, most adults need muscle-strengthening exercises on 2 or more days a week.     Maintain a healthy weight. The body mass index (BMI) is a screening tool to identify possible weight problems. It provides an estimate of body fat based on height and weight. Your caregiver can  help determine your BMI, and can help you achieve or maintain a healthy weight. For adults 20 years and older:   A BMI below 18.5 is considered underweight.   A BMI of 18.5 to 24.9 is normal.   A BMI of 25 to 29.9 is considered overweight.   A BMI of 30 and above is considered obese.   Maintain normal blood lipids and cholesterol by exercising and minimizing your intake of saturated fat. Eat a balanced diet with plenty of fruits and vegetables. Blood tests for lipids and cholesterol should begin at age 13 and be repeated every 5 years. If your lipid or cholesterol levels are high, you are over 50, or you are a high risk for heart disease, you may need your cholesterol levels checked more frequently. Ongoing high lipid and cholesterol levels should be treated with medicines if diet and exercise are not effective.   If you smoke, find out from your caregiver how to quit. If you do not use tobacco, do not start.   If you are pregnant, do not drink alcohol. If you are breastfeeding, be very cautious about drinking alcohol. If you are not pregnant and choose to drink alcohol, do not exceed 1 drink per day. One drink is considered to be 12 ounces (355 mL) of beer, 5 ounces (148 mL) of wine, or 1.5 ounces (44 mL) of liquor.   Avoid use of street drugs. Do not share needles with anyone. Ask for help if you need support or instructions about stopping the use of drugs.   High blood pressure causes heart disease and increases the risk of stroke. Blood  pressure should be checked at least every 1 to 2 years. Ongoing high blood pressure should be treated with medicines, if weight loss and exercise are not effective.   If you are 80 to 56 years old, ask your caregiver if you should take aspirin to prevent strokes.   Diabetes screening involves taking a blood sample to check your fasting blood sugar level. This should be done once every 3 years, after age 8, if you are within normal weight and without risk  factors for diabetes. Testing should be considered at a younger age or be carried out more frequently if you are overweight and have at least 1 risk factor for diabetes.   Breast cancer screening is essential preventative care for women. You should practice "breast self-awareness." This means understanding the normal appearance and feel of your breasts and may include breast self-examination. Any changes detected, no matter how small, should be reported to a caregiver. Women in their 108s and 30s should have a clinical breast exam (CBE) by a caregiver as part of a regular health exam every 1 to 3 years. After age 61, women should have a CBE every year. Starting at age 68, women should consider having a mammogram (breast X-ray) every year. Women who have a family history of breast cancer should talk to their caregiver about genetic screening. Women at a high risk of breast cancer should talk to their caregiver about having an MRI and a mammogram every year.   The Pap test is a screening test for cervical cancer. Women should have a Pap test starting at age 60. Between ages 63 and 74, Pap tests should be repeated every 2 years. Beginning at age 43, you should have a Pap test every 3 years as long as the past 3 Pap tests have been normal. If you had a hysterectomy for a problem that was not cancer or a condition that could lead to cancer, then you no longer need Pap tests. If you are between ages 26 and 8, and you have had normal Pap tests going back 10 years, you no longer need Pap tests. If you have had past treatment for cervical cancer or a condition that could lead to cancer, you need Pap tests and screening for cancer for at least 20 years after your treatment. If Pap tests have been discontinued, risk factors (such as a new sexual partner) need to be reassessed to determine if screening should be resumed. Some women have medical problems that increase the chance of getting cervical cancer. In these cases,  your caregiver may recommend more frequent screening and Pap tests.   The human papillomavirus (HPV) test is an additional test that may be used for cervical cancer screening. The HPV test looks for the virus that can cause the cell changes on the cervix. The cells collected during the Pap test can be tested for HPV. The HPV test could be used to screen women aged 65 years and older, and should be used in women of any age who have unclear Pap test results. After the age of 33, women should have HPV testing at the same frequency as a Pap test.   Colorectal cancer can be detected and often prevented. Most routine colorectal cancer screening begins at the age of 85 and continues through age 65. However, your caregiver may recommend screening at an earlier age if you have risk factors for colon cancer. On a yearly basis, your caregiver may provide home test kits to check for  hidden blood in the stool. Use of a small camera at the end of a tube, to directly examine the colon (sigmoidoscopy or colonoscopy), can detect the earliest forms of colorectal cancer. Talk to your caregiver about this at age 93, when routine screening begins. Direct examination of the colon should be repeated every 5 to 10 years through age 56, unless early forms of pre-cancerous polyps or small growths are found.   Hepatitis C blood testing is recommended for all people born from [birth year] through 1965 and any individual with known risks for hepatitis C.   Practice safe sex. Use condoms and avoid high-risk sexual practices to reduce the spread of sexually transmitted infections (STIs). Sexually active women aged 60 and younger should be checked for Chlamydia, which is a common sexually transmitted infection. Older women with new or multiple partners should also be tested for Chlamydia. Testing for other STIs is recommended if you are sexually active and at increased risk.   Osteoporosis is a disease in which the bones lose minerals and  strength with aging. This can result in serious bone fractures. The risk of osteoporosis can be identified using a bone density scan. Women ages 5 and over and women at risk for fractures or osteoporosis should discuss screening with their caregivers. Ask your caregiver whether you should be taking a calcium supplement or vitamin D to reduce the rate of osteoporosis.   Menopause can be associated with physical symptoms and risks. Hormone replacement therapy is available to decrease symptoms and risks. You should talk to your caregiver about whether hormone replacement therapy is right for you.   Use sunscreen with a sun protection factor (SPF) of 30 or greater. Apply sunscreen liberally and repeatedly throughout the day. You should seek shade when your shadow is shorter than you. Protect yourself by wearing long sleeves, pants, a wide-brimmed hat, and sunglasses year round, whenever you are outdoors.   Notify your caregiver of new moles or changes in moles, especially if there is a change in shape or color. Also notify your caregiver if a mole is larger than the size of a pencil eraser.   Stay current with your immunizations.  Document Released: 09/01/2010 Document Revised: 05/11/2011 Document Reviewed: 09/01/2010 [facility] Patient Information 2013 [place], [place].   Back Exercises Back exercises help treat and prevent back injuries. The goal is to increase your strength in your belly (abdominal) and back muscles. These exercises can also help with flexibility. Start these exercises when told by your doctor. HOME CARE Back exercises include: Pelvic Tilt.  Lie on your back with your knees bent. Tilt your pelvis until the lower part of your back is against the floor. Hold this position 5 to 10 sec. Repeat this exercise 5 to 10 times.  Knee to Chest.  Pull 1 knee up against your chest and hold for 20 to 30 seconds. Repeat this with the other knee. This may be done with the other leg straight or  bent, whichever feels better. Then, pull both knees up against your chest.  Sit-Ups or Curl-Ups.  Bend your knees 90 degrees. Start with tilting your pelvis, and do a partial, slow sit-up. Only lift your upper half 30 to 45 degrees off the floor. Take at least 2 to 3 seonds for each sit-up. Do not do sit-ups with your knees out straight. If partial sit-ups are difficult, simply do the above but with only tightening your belly (abdominal) muscles and holding it as told.  Hip-Lift.  Lie on  your back with your knees flexed 90 degrees. Push down with your feet and shoulders as you raise your hips 2 inches off the floor. Hold for 10 seconds, repeat 5 to 10 times.  Back Arches.  Lie on your stomach. Prop yourself up on bent elbows. Slowly press on your hands, causing an arch in your low back. Repeat 3 to 5 times.  Shoulder-Lifts.  Lie face down with arms beside your body. Keep hips and belly pressed to floor as you slowly lift your head and shoulders off the floor.  Do not overdo your exercises. Be careful in the beginning. Exercises may cause you some mild back discomfort. If the pain lasts for more than 15 minutes, stop the exercises until you see your doctor. Improvement with exercise for back problems is slow.   Document Released: 03/21/2010 Document Revised: 05/11/2011 Document Reviewed: 12/18/2010 [facility] Patient Information 2013 [place], [place].

## 2012-01-19 NOTE — Progress Notes (Signed)
Subjective:    [name], female    DOB: 09-Aug-1955, 56 y.o.   MRN: [id number]  HPI  patient is here today for annual physical. Patient feels well overall -  complains of low back pain Precipitated by overuse at gym? Pain improved with standing - worse with sitting or lying or activity No radiation into buttock, groin or leg Not associated with rash or bruise - no dysuria or hematuria - no hx kidney stone known  Also reviewed chronic medical issues:  HTN - on tx until 2010, prev stopped same due to successful BP control with weight loss - resumed norvasc 12/12 -the patient reports compliance with medication(s) as prescribed. Denies adverse side effects.  GERD - the patient reports compliance with medication(s) as prescribed. Denies adverse side effects.  OA - L TKR 04/2011 - improved compared to preoperative state - no joint swelling - takes meloxicam daily  allergic rhinitis - chronic nasal steroids and singular prn in past - intermittent OTC antihistamine use - sinus pressure but no fever or pain  Past Medical History  Diagnosis Date  . GERD (gastroesophageal reflux disease)   . Hypertension   . Migraines   . OSA (obstructive sleep apnea)     uses cpap nightly  . Arthritis      knee s/p TKR 2/13   Family History  Problem Relation Age of Onset  . Arthritis Mother   . Breast cancer Mother   . Arthritis Father   . Alcohol abuse Other   . Diabetes Other    History  Substance Use Topics  . Smoking status: Never Smoker   . Smokeless tobacco: Not on file  . Alcohol Use: 0.6 oz/week    1 Glasses of wine per week     Comment: rare social    Review of Systems Constitutional: Negative for fever or weight change.  Respiratory: Negative for cough and shortness of breath.   Cardiovascular: Negative for chest pain or palpitations.  Gastrointestinal: Negative for abdominal pain, no bowel changes.  Musculoskeletal: Negative for gait problem or joint swelling.   Skin: Negative for rash.  Neurological: Negative for dizziness or headache.  No other specific complaints in a complete review of systems (except as listed in HPI above).     Objective:   Physical Exam  BP 122/84  Pulse 87  Temp 98.2 F (36.8 C) (Oral)  Ht 5\' 8"  (1.727 m)  Wt 292 lb (132.45 kg)  BMI 44.40 kg/m2  SpO2 96% Wt Readings from Last 3 Encounters:  01/19/12 292 lb (132.45 kg)  06/01/11 282 lb (127.914 kg)  04/13/11 283 lb 3.3 oz (128.46 kg)   Constitutional: She is overweight, but appears well-developed and well-nourished. No distress.  HENT: NCAT, sinus nontender - B TMs clear - OP clear Eyes: mild conjunctivitis R eye - no discharge - PERRL, EOMI - vision grossly intact without correction Neck: Normal range of motion. Neck supple. No JVD or LAD present. No thyromegaly present.  Cardiovascular: Normal rate, regular rhythm and normal heart sounds.  No murmur heard. No BLE edema. Pulmonary/Chest: Effort normal and breath sounds normal. No respiratory distress. She has no wheezes.  Abd: SNTND+BS, no mass GU: defer to gyn Musculoskeletal: Back: full range of motion of thoracic and lumbar spine. Non tender to palpation. Negative straight leg raise. DTR's are symmetrically intact. Sensation intact in all dermatomes of the lower extremities. Full strength to manual muscle testing. patient is able to heel toe walk without difficulty  and ambulates with antalgic gait. L knee - s/p TKR - nontender and no effusion; r knee s/p TKR with normal range of motion, no joint effusions. No gross deformities Skin: Skin is warm and dry. No rash noted. No erythema.  Psychiatric: She has a normal mood and affect. Her behavior is normal. Judgment and thought content normal.   Lab Results  Component Value Date   WBC 11.3* 04/16/2011   HGB 10.9* 04/16/2011   HCT 33.3* 04/16/2011   PLT 253 04/16/2011   GLUCOSE 129* 04/15/2011   ALT 23 04/07/2011   AST 19 04/07/2011   NA 133* 04/15/2011   K 3.6  04/15/2011   CL 98 04/15/2011   CREATININE 0.57 04/15/2011   BUN 6 04/15/2011   CO2 28 04/15/2011   INR 1.00 04/07/2011   ECG: sinus @ 84 bpm - no st-t changes or arrythmias     Assessment & Plan:  CPX/v70.0 - Patient has been counseled on age-appropriate routine health concerns for screening and prevention. These are reviewed and up-to-date. Immunizations are up-to-date or declined. Labs and ECG reviewed.  low back pain - muscle spasm on exam from strain/overuse - no neuro deficits - change NSAIDs x 1 week and use muscle relaxer prn - exercises provided - pt to call if worse or unimproved - discussed relationship of weight to osteoarthritis pain

## 2012-01-19 NOTE — Assessment & Plan Note (Signed)
resumed prior tx (on norvasc until 2010) 12/12 The current medical regimen is effective;  continue present plan and medications.  Also encouraged to continue diet and exercise efforts to control BP and weight  BP Readings from Last 3 Encounters:  01/19/12 122/84  06/01/11 122/82  04/16/11 126/82

## 2012-01-19 NOTE — Assessment & Plan Note (Signed)
Wt Readings from Last 3 Encounters:  01/19/12 292 lb (132.45 kg)  06/01/11 282 lb (127.914 kg)  04/13/11 283 lb 3.3 oz (128.46 kg)   Weight changes reviewed The patient is asked to make an attempt to improve diet and exercise patterns to aid in medical management of this problem.

## 2012-03-22 ENCOUNTER — Other Ambulatory Visit: Payer: Self-pay | Admitting: *Deleted

## 2012-03-22 MED ORDER — MELOXICAM 15 MG PO TABS: 15.0000 mg | ORAL_TABLET | Freq: Every day | ORAL | Status: DC

## 2012-03-22 MED ORDER — OMEPRAZOLE 20 MG PO CPDR: 20.0000 mg | DELAYED_RELEASE_CAPSULE | Freq: Every day | ORAL | Status: DC

## 2012-03-22 MED ORDER — AMLODIPINE BESYLATE 5 MG PO TABS: 5.0000 mg | ORAL_TABLET | Freq: Every day | ORAL | Status: DC

## 2012-03-31 ENCOUNTER — Ambulatory Visit (INDEPENDENT_AMBULATORY_CARE_PROVIDER_SITE_OTHER): Payer: 59 | Admitting: Internal Medicine

## 2012-03-31 ENCOUNTER — Ambulatory Visit (INDEPENDENT_AMBULATORY_CARE_PROVIDER_SITE_OTHER)
Admission: RE | Admit: 2012-03-31 | Discharge: 2012-03-31 | Disposition: A | Discharge status: Home / Self Care | Payer: 59 | Source: Ambulatory Visit | Attending: Internal Medicine | Admitting: Internal Medicine

## 2012-03-31 ENCOUNTER — Encounter: Payer: Self-pay | Admitting: Internal Medicine

## 2012-03-31 VITALS — BP 128/90 | HR 91 | Temp 98.1°F | Wt 297.0 lb

## 2012-03-31 DIAGNOSIS — M545 Low back pain, unspecified: Secondary | ICD-10-CM

## 2012-03-31 MED ORDER — TIZANIDINE HCL 4 MG PO CAPS: 4.0000 mg | ORAL_CAPSULE | Freq: Three times a day (TID) | ORAL | Status: DC | PRN

## 2012-03-31 MED ORDER — OXYCODONE HCL 5 MG PO TABS: 5.0000 mg | ORAL_TABLET | Freq: Three times a day (TID) | ORAL | Status: AC | PRN

## 2012-03-31 NOTE — Progress Notes (Signed)
Subjective:    [name], female    DOB: 1955-09-08, 57 y.o.   MRN: [id number] CC Lower back pain x 2 months HPI Pt seen in 01/2012 for new onset low back pain.  Currently maintained on NSAID therapy Mobic daily and muscle relaxer- methocarbamol.  Pt states pain has become worse in the last few weeks and was excruciating yesterday.  The pain does not radiate down either leg or buttock.  Pain is localized to the lumbosacral region and to the right side of the lower back.  Pt reports medications especially the muscle relaxant helps but doesn't take the pain all away.  Pt reports pain a 10/10 and hurts lying, standing and sitting.  Pt has not attempted back strengthening exercises.     Past Medical History  Diagnosis Date  . GERD (gastroesophageal reflux disease)   . Hypertension   . Migraines   . OSA (obstructive sleep apnea)     uses cpap nightly  . Arthritis      knee s/p TKR 2/13    Review of Systems  Constitutional: Negative for fever, activity change and fatigue.  Cardiovascular: Negative for chest pain and leg swelling.  Genitourinary: Negative for difficulty urinating and pelvic pain.  Musculoskeletal: Positive for back pain. Negative for joint swelling and gait problem.  Neurological: Negative for syncope, weakness, numbness and headaches.       Objective:   Physical Exam  Constitutional: She is oriented to person, place, and time. She appears well-developed and well-nourished. No distress.  Neck: Normal range of motion. Neck supple. No thyromegaly present.  Cardiovascular: Normal rate, regular rhythm and normal heart sounds.  Exam reveals no gallop and no friction rub.   No murmur heard. Pulmonary/Chest: Effort normal and breath sounds normal. No respiratory distress.  Abdominal: Soft. Bowel sounds are normal. She exhibits no distension. There is no tenderness. There is no CVA tenderness.  Musculoskeletal: Normal range of motion.       Lumbar back: She  exhibits tenderness and spasm. She exhibits no swelling.       Paravertebral muscles tight in lumbosacral region with tenderness over R side. Point tenderness over sacral vertebrae.    Neurological: She is alert and oriented to person, place, and time.  Skin: Skin is warm and dry.  Psychiatric: She has a normal mood and affect. Her behavior is normal. Judgment and thought content normal.   BP 128/90  Pulse 91  Temp 98.1 F (36.7 C) (Oral)  Wt 297 lb (134.718 kg)  SpO2 96% Lab Results  Component Value Date   WBC 8.0 01/19/2012   HGB 13.7 01/19/2012   HCT 41.4 01/19/2012   PLT 281.0 01/19/2012   GLUCOSE 116* 01/19/2012   CHOL 221* 01/19/2012   TRIG 122.0 01/19/2012   HDL 51.30 01/19/2012   LDLDIRECT 144.5 01/19/2012   ALT 15 01/19/2012   AST 16 01/19/2012   NA 138 01/19/2012   K 4.2 01/19/2012   CL 103 01/19/2012   CREATININE 0.7 01/19/2012   BUN 16 01/19/2012   CO2 28 01/19/2012   TSH 1.91 01/19/2012   INR 1.00 04/07/2011   HGBA1C 6.3 01/20/2012   Wt Readings from Last 3 Encounters:  03/31/12 297 lb (134.718 kg)  01/19/12 292 lb (132.45 kg)  06/01/11 282 lb (127.914 kg)       Assessment & Plan:  Low back pain- Pt with obvious pain but no current deficits of motor/motion.   Pt advised to continue Mobic daily,  given new rx of zanaflex for muscle spasm.  Sent for x-ray of lumbar spine and MRI of back.  Referred to back specialist for further examination and treatment of expected ongoing pain.  Education given on back precautions and back/core strengthening exercises provided.  Advised to call if any concerns or if pain worsens prior to specialist visit.  Chelse Matas A Ica Daye PA-S    I have personally reviewed this case with PA student. I also personally examined this patient. I agree with history and findings as documented above. I reviewed, discussed and approve of the assessment and plan as listed above. Rene Paci, MD

## 2012-03-31 NOTE — Patient Instructions (Signed)
It was good to see you today. We have reviewed your prior records including labs and tests today Test(s) ordered today. Your results will be released to MyChart (or called to you) after review, usually within 72hours after test completion. If any changes need to be made, you will be notified at that same time. we'll make referral for MRI back. Our office will contact you regarding appointment(s) once made. we'll make referral to back specialist. Our office will contact you regarding appointment(s) once made. Continue meloxicam daily and use Zanaflex for spasm as needed and OxyIR if severe pain -  Your prescription(s) have been given to you or submitted to your pharmacy. Please take as directed and contact our office if you believe you are having problem(s) with the medication(s). Back Exercises Back exercises help treat and prevent back injuries. The goal of back exercises is to increase the strength of your abdominal and back muscles and the flexibility of your back. These exercises should be started when you no longer have back pain. Back exercises include:  Pelvic Tilt. Lie on your back with your knees bent. Tilt your pelvis until the lower part of your back is against the floor. Hold this position 5 to 10 sec and repeat 5 to 10 times.   Knee to Chest. Pull first 1 knee up against your chest and hold for 20 to 30 seconds, repeat this with the other knee, and then both knees. This may be done with the other leg straight or bent, whichever feels better.   Sit-Ups or Curl-Ups. Bend your knees 90 degrees. Start with tilting your pelvis, and do a partial, slow sit-up, lifting your trunk only 30 to 45 degrees off the floor. Take at least 2 to 3 seconds for each sit-up. Do not do sit-ups with your knees out straight. If partial sit-ups are difficult, simply do the above but with only tightening your abdominal muscles and holding it as directed.   Hip-Lift. Lie on your back with your knees flexed 90  degrees. Push down with your feet and shoulders as you raise your hips a couple inches off the floor; hold for 10 seconds, repeat 5 to 10 times.   Back arches. Lie on your stomach, propping yourself up on bent elbows. Slowly press on your hands, causing an arch in your low back. Repeat 3 to 5 times. Any initial stiffness and discomfort should lessen with repetition over time.   Shoulder-Lifts. Lie face down with arms beside your body. Keep hips and torso pressed to floor as you slowly lift your head and shoulders off the floor.  Do not overdo your exercises, especially in the beginning. Exercises may cause you some mild back discomfort which lasts for a few minutes; however, if the pain is more severe, or lasts for more than 15 minutes, do not continue exercises until you see your caregiver. Improvement with exercise therapy for back problems is slow.   See your caregivers for assistance with developing a proper back exercise program. Document Released: 03/26/2004 Document Revised: 05/11/2011 Document Reviewed: 12/18/2010 [facility] Patient Information 2013 [place], [place].

## 2012-03-31 NOTE — Progress Notes (Signed)
Subjective:    [name], female    DOB: [birth year], 57 y.o.   MRN: [id number]  Back Pain This is a chronic problem. The current episode started more than 1 month ago. The problem occurs daily. The problem is unchanged. The pain is present in the lumbar spine. The pain does not radiate. The pain is moderate. The pain is the same all the time. The symptoms are aggravated by lying down, position, standing, twisting and stress. Stiffness is present all day. Pertinent negatives include no abdominal pain, bladder incontinence, bowel incontinence, chest pain, dysuria, fever, headaches, leg pain, numbness, paresis, paresthesias, tingling, weakness or weight loss. Risk factors include obesity, menopause and sedentary lifestyle. She has tried NSAIDs and muscle relaxant for the symptoms. The treatment provided mild relief.   Past Medical History  Diagnosis Date  . GERD (gastroesophageal reflux disease)   . Hypertension   . Migraines   . OSA (obstructive sleep apnea)     uses cpap nightly  . Arthritis      knee s/p TKR 2/13    Review of Systems  Constitutional: Negative for fever and weight loss.  Cardiovascular: Negative for chest pain.  Gastrointestinal: Negative for abdominal pain and bowel incontinence.  Genitourinary: Negative for bladder incontinence and dysuria.  Musculoskeletal: Positive for back pain.  Neurological: Negative for tingling, weakness, numbness, headaches and paresthesias.       Objective:   Physical Exam BP 128/90  Pulse 91  Temp 98.1 F (36.7 C) (Oral)  Wt 297 lb (134.718 kg)  SpO2 96% Wt Readings from Last 3 Encounters:  03/31/12 297 lb (134.718 kg)  01/19/12 292 lb (132.45 kg)  06/01/11 282 lb (127.914 kg)   Constitutional: She appears well-developed and well-nourished. No distress.  Cardiovascular: Normal rate, regular rhythm and normal heart sounds.  No murmur heard. No BLE edema. Pulmonary/Chest: Effort normal and breath sounds normal. No  respiratory distress. She has no wheezes.  Musculoskeletal: Back: full range of motion of thoracic and lumbar spine. Non tender to palpation. positive contralateral straight leg raise (R flank pain with L leg lift). DTR's are symmetrically intact. Sensation intact in all dermatomes of the lower extremities. Full strength to manual muscle testing. patient is able to heel toe walk without difficulty and ambulates with antalgic gait Skin: Skin is warm and dry. No rash noted. No erythema.  Psychiatric: She has a normal mood and affect. Her behavior is normal. Judgment and thought content normal.   Lab Results  Component Value Date   WBC 8.0 01/19/2012   HGB 13.7 01/19/2012   HCT 41.4 01/19/2012   PLT 281.0 01/19/2012   GLUCOSE 116* 01/19/2012   CHOL 221* 01/19/2012   TRIG 122.0 01/19/2012   HDL 51.30 01/19/2012   LDLDIRECT 144.5 01/19/2012   ALT 15 01/19/2012   AST 16 01/19/2012   NA 138 01/19/2012   K 4.2 01/19/2012   CL 103 01/19/2012   CREATININE 0.7 01/19/2012   BUN 16 01/19/2012   CO2 28 01/19/2012   TSH 1.91 01/19/2012   INR 1.00 04/07/2011   HGBA1C 6.3 01/20/2012        Assessment & Plan:   Lumbar pain right greater than left side ongoing greater than 6 weeks No clear motor deficit on exam, significant muscle spasm appreciated Minimal persisting relief with anti-inflammatory, and able to use prednisone due to side effects of same Renewed alternate muscle relaxer, try Zanaflex in place of Robaxin Continue daily meloxicam Check plain film and  refer for MRI given duration greater than 6 weeks Also refer to back specialist to consider ESI or facet injection for DDD mediated pain or other problem as identified on MRI Back exercises provided education for patient today

## 2012-04-12 ENCOUNTER — Ambulatory Visit
Admission: RE | Admit: 2012-04-12 | Discharge: 2012-04-12 | Disposition: A | Discharge status: Home / Self Care | Payer: 59 | Source: Ambulatory Visit | Attending: Internal Medicine | Admitting: Internal Medicine

## 2012-04-12 DIAGNOSIS — M545 Low back pain, unspecified: Secondary | ICD-10-CM

## 2012-10-20 DIAGNOSIS — M48061 Spinal stenosis, lumbar region without neurogenic claudication: Secondary | ICD-10-CM | POA: Insufficient documentation

## 2012-10-20 DIAGNOSIS — M47817 Spondylosis without myelopathy or radiculopathy, lumbosacral region: Secondary | ICD-10-CM | POA: Insufficient documentation

## 2012-12-28 ENCOUNTER — Ambulatory Visit (INDEPENDENT_AMBULATORY_CARE_PROVIDER_SITE_OTHER)
Admission: RE | Admit: 2012-12-28 | Discharge: 2012-12-28 | Disposition: A | Discharge status: Home / Self Care | Payer: 59 | Source: Ambulatory Visit | Attending: Internal Medicine | Admitting: Internal Medicine

## 2012-12-28 ENCOUNTER — Other Ambulatory Visit (INDEPENDENT_AMBULATORY_CARE_PROVIDER_SITE_OTHER): Payer: 59

## 2012-12-28 ENCOUNTER — Encounter: Payer: Self-pay | Admitting: Internal Medicine

## 2012-12-28 ENCOUNTER — Ambulatory Visit: Payer: 59

## 2012-12-28 ENCOUNTER — Ambulatory Visit (INDEPENDENT_AMBULATORY_CARE_PROVIDER_SITE_OTHER): Payer: 59 | Admitting: Internal Medicine

## 2012-12-28 VITALS — BP 120/78 | HR 81 | Temp 98.4°F | Wt 289.0 lb

## 2012-12-28 DIAGNOSIS — Z0000 Encounter for general adult medical examination without abnormal findings: Secondary | ICD-10-CM

## 2012-12-28 DIAGNOSIS — R7989 Other specified abnormal findings of blood chemistry: Secondary | ICD-10-CM

## 2012-12-28 DIAGNOSIS — I10 Essential (primary) hypertension: Secondary | ICD-10-CM

## 2012-12-28 DIAGNOSIS — J320 Chronic maxillary sinusitis: Secondary | ICD-10-CM

## 2012-12-28 LAB — LIPID PANEL
Cholesterol: 195 mg/dL (ref 0–200)
LDL Cholesterol: 120 mg/dL — ABNORMAL HIGH (ref 0–99)
Total CHOL/HDL Ratio: 4
Triglycerides: 125 mg/dL (ref 0.0–149.0)

## 2012-12-28 LAB — CBC WITH DIFFERENTIAL/PLATELET
Basophils Absolute: 0 10*3/uL (ref 0.0–0.1)
Eosinophils Absolute: 0.3 10*3/uL (ref 0.0–0.7)
HCT: 41.5 % (ref 36.0–46.0)
Hemoglobin: 14.1 g/dL (ref 12.0–15.0)
Lymphocytes Relative: 30.3 % (ref 12.0–46.0)
Lymphs Abs: 2.4 10*3/uL (ref 0.7–4.0)
MCHC: 33.9 g/dL (ref 30.0–36.0)
Monocytes Relative: 6.9 % (ref 3.0–12.0)
Neutro Abs: 4.7 10*3/uL (ref 1.4–7.7)
Neutrophils Relative %: 58.9 % (ref 43.0–77.0)
RDW: 14.2 % (ref 11.5–14.6)

## 2012-12-28 LAB — HEPATIC FUNCTION PANEL
Albumin: 4 g/dL (ref 3.5–5.2)
Alkaline Phosphatase: 86 U/L (ref 39–117)
Bilirubin, Direct: 0.1 mg/dL (ref 0.0–0.3)
Total Bilirubin: 0.8 mg/dL (ref 0.3–1.2)
Total Protein: 8 g/dL (ref 6.0–8.3)

## 2012-12-28 LAB — URINALYSIS, ROUTINE W REFLEX MICROSCOPIC
Hgb urine dipstick: NEGATIVE
Leukocytes, UA: NEGATIVE
Nitrite: NEGATIVE
Specific Gravity, Urine: 1.025 (ref 1.000–1.030)
Urine Glucose: NEGATIVE
Urobilinogen, UA: 0.2 (ref 0.0–1.0)

## 2012-12-28 LAB — BASIC METABOLIC PANEL
CO2: 29 mEq/L (ref 19–32)
Chloride: 104 mEq/L (ref 96–112)
Creatinine, Ser: 0.7 mg/dL (ref 0.4–1.2)
Glucose, Bld: 114 mg/dL — ABNORMAL HIGH (ref 70–99)
Sodium: 140 mEq/L (ref 135–145)

## 2012-12-28 LAB — TSH: TSH: 1.37 u[IU]/mL (ref 0.35–5.50)

## 2012-12-28 LAB — HEMOGLOBIN A1C: Hgb A1c MFr Bld: 6.5 % (ref 4.6–6.5)

## 2012-12-28 MED ORDER — MONTELUKAST SODIUM 10 MG PO TABS: 10.0000 mg | ORAL_TABLET | Freq: Every day | ORAL | Status: DC

## 2012-12-28 MED ORDER — MOMETASONE FUROATE 50 MCG/ACT NA SUSP: 2.0000 | Freq: Every day | NASAL | Status: DC

## 2012-12-28 MED ORDER — OMEPRAZOLE 20 MG PO CPDR: 20.0000 mg | DELAYED_RELEASE_CAPSULE | Freq: Every day | ORAL | Status: DC

## 2012-12-28 MED ORDER — AMLODIPINE BESYLATE 5 MG PO TABS: 5.0000 mg | ORAL_TABLET | Freq: Every day | ORAL | Status: DC

## 2012-12-28 MED ORDER — LEVOFLOXACIN 500 MG PO TABS: 500.0000 mg | ORAL_TABLET | Freq: Every day | ORAL | Status: DC

## 2012-12-28 MED ORDER — MELOXICAM 15 MG PO TABS: 15.0000 mg | ORAL_TABLET | Freq: Every day | ORAL | Status: DC

## 2012-12-28 NOTE — Progress Notes (Signed)
Subjective:    [name], female    DOB: [birth year], 57 y.o.   MRN: [id number]  Sinusitis This is a new problem. The current episode started more than 1 month ago. The problem has been gradually worsening since onset. There has been no fever. Her pain is at a severity of 6/10. The pain is moderate. Associated symptoms include congestion, coughing, ear pain, headaches and sinus pressure. Pertinent negatives include no chills, diaphoresis, hoarse voice, neck pain, shortness of breath, sore throat or swollen glands. Past treatments include acetaminophen, oral decongestants and saline sprays. The treatment provided mild relief.   Also patient is here today for annual physical. Patient feels well overall -  Also reviewed chronic medical issues and interval medical events:  HTN - on tx until 2010, prev stopped same due to successful BP control with weight loss - resumed norvasc 12/12 -the patient reports compliance with medication(s) as prescribed. Denies adverse side effects.  GERD - the patient reports compliance with medication(s) as prescribed. Denies adverse side effects.  OA - L TKR 04/2011 - improved compared to preoperative state - no joint swelling - takes meloxicam daily  allergic rhinitis - chronic nasal steroids and singular prn in past - intermittent OTC antihistamine use - sinus pressure but no fever - see above  Past Medical History  Diagnosis Date  . GERD (gastroesophageal reflux disease)   . Hypertension   . Migraines   . OSA (obstructive sleep apnea)     uses cpap nightly  . Arthritis      knee s/p TKR 2/13   Family History  Problem Relation Age of Onset  . Arthritis Mother   . Breast cancer Mother   . Arthritis Father   . Alcohol abuse Other   . Diabetes Other    History  Substance Use Topics  . Smoking status: Never Smoker   . Smokeless tobacco: Not on file  . Alcohol Use: 0.6 oz/week    1 Glasses of wine per week     Comment: rare social     Review of Systems  Constitutional: Negative for chills and diaphoresis.  HENT: Positive for congestion, ear pain and sinus pressure. Negative for hoarse voice and sore throat.   Eyes: Negative for pain and visual disturbance.  Respiratory: Positive for cough. Negative for shortness of breath.   Cardiovascular: Negative for chest pain and leg swelling.  Musculoskeletal: Negative for neck pain.  Neurological: Positive for dizziness and headaches.  All other systems reviewed and are negative.       Objective:   Physical Exam BP 120/78  Pulse 81  Temp(Src) 98.4 F (36.9 C) (Oral)  Wt 289 lb (131.09 kg)  BMI 43.95 kg/m2  SpO2 96% Wt Readings from Last 3 Encounters:  12/28/12 289 lb (131.09 kg)  03/31/12 297 lb (134.718 kg)  01/19/12 292 lb (132.45 kg)   Constitutional: She is overweight, but appears well-developed and well-nourished. No distress.   HENT: Head: Normocephalic and atraumatic. Moderate tenderness to palpation over sinuses bilaterally, frontal left greater than right. Ears: B TMs with opaque changes, no effusion or erythema; Nose: swollen turbinates left greater than right but no clear evidence for polyp. Evidence for thick discharge and mucosal edema. Mouth/Throat: Oropharynx is clear and moist. No oropharyngeal exudate.  Eyes: mild periorbital swelling on left upper greater than lower, medial greater than lateral. Conjunctivae and EOM are normal. Pupils are equal, round, and reactive to light. No scleral icterus.  Neck: thick.Normal range of  motion. Neck supple. No appreciable lymphadenopathy. No JVD present. No thyromegaly present.  Cardiovascular: Normal rate, regular rhythm and normal heart sounds.  No murmur heard. No BLE edema. Pulmonary/Chest: Effort normal and breath sounds normal. No respiratory distress. She has no wheezes.  Abdominal: Soft. Bowel sounds are normal. She exhibits no distension. There is no tenderness. no masses Musculoskeletal: Normal range of  motion, no joint effusions. No gross deformities Neurological: She is alert and oriented to person, place, and time. No cranial nerve deficit. Coordination, balance, strength, speech and gait are normal.  Skin: Skin is warm and dry. No rash noted. No erythema.  Psychiatric: She has a normal mood and affect. Her behavior is normal. Judgment and thought content normal.      Lab Results  Component Value Date   WBC 8.0 01/19/2012   HGB 13.7 01/19/2012   HCT 41.4 01/19/2012   PLT 281.0 01/19/2012   GLUCOSE 116* 01/19/2012   CHOL 221* 01/19/2012   TRIG 122.0 01/19/2012   HDL 51.30 01/19/2012   LDLDIRECT 144.5 01/19/2012   ALT 15 01/19/2012   AST 16 01/19/2012   NA 138 01/19/2012   K 4.2 01/19/2012   CL 103 01/19/2012   CREATININE 0.7 01/19/2012   BUN 16 01/19/2012   CO2 28 01/19/2012   TSH 1.91 01/19/2012   INR 1.00 04/07/2011   HGBA1C 6.3 01/20/2012       Assessment & Plan:  CPX/v70.0 - Patient has been counseled on age-appropriate routine health concerns for screening and prevention. These are reviewed and up-to-date. Immunizations are up-to-date or declined. Labs ordered and reviewed.  Left sinusitis, acute on chronic - given swelling periorbital region and dizzy symptoms, will check CT sinus r/o anatomic obstruction causing chronic sinusitis. Treat with 10 days Levaquin and resume nasal steroid  Also, See problem list. Medications and labs reviewed today.

## 2012-12-28 NOTE — Assessment & Plan Note (Signed)
Resumed norvasc 01/2011 The current medical regimen is effective;  continue present plan and medications.  Also encouraged to continue diet and exercise efforts to control BP and weight  BP Readings from Last 3 Encounters:  12/28/12 120/78  03/31/12 128/90  01/19/12 122/84

## 2012-12-28 NOTE — Addendum Note (Signed)
Addended by: Deatra James on: 12/28/2012 10:27 AM   Modules accepted: Orders

## 2012-12-28 NOTE — Patient Instructions (Addendum)
It was good to see you today.  We have reviewed your prior records including labs and tests today  Test(s) ordered today. Your results will be released to MyChart (or called to you) after review, usually within 72hours after test completion. If any changes need to be made, you will be notified at that same time.  we'll make referral for CT sinus as discussed . Our office will contact you regarding appointment(s) once made.  Health Maintenance reviewed - all recommended immunizations and age-appropriate screenings are up-to-date.  Medications reviewed and updated For sinus problems, Levaquin antibiotics once daily for 10 days and resume Nasonex spray -these prescription sent to local pharmacy  Other chronic medication refill since to mail order as requested, no changes recommended  Please schedule followup in 6 months for BP and weight check, call sooner if problems.  Health Maintenance, Females A healthy lifestyle and preventative care can promote health and wellness.  Maintain regular health, dental, and eye exams.  Eat a healthy diet. Foods like vegetables, fruits, whole grains, low-fat dairy products, and lean protein foods contain the nutrients you need without too many calories. Decrease your intake of foods high in solid fats, added sugars, and salt. Get information about a proper diet from your caregiver, if necessary.  Regular physical exercise is one of the most important things you can do for your health. Most adults should get at least 150 minutes of moderate-intensity exercise (any activity that increases your heart rate and causes you to sweat) each week. In addition, most adults need muscle-strengthening exercises on 2 or more days a week.   Maintain a healthy weight. The body mass index (BMI) is a screening tool to identify possible weight problems. It provides an estimate of body fat based on height and weight. Your caregiver can help determine your BMI, and can help you  achieve or maintain a healthy weight. For adults 20 years and older:  A BMI below 18.5 is considered underweight.  A BMI of 18.5 to 24.9 is normal.  A BMI of 25 to 29.9 is considered overweight.  A BMI of 30 and above is considered obese.  Maintain normal blood lipids and cholesterol by exercising and minimizing your intake of saturated fat. Eat a balanced diet with plenty of fruits and vegetables. Blood tests for lipids and cholesterol should begin at age 70 and be repeated every 5 years. If your lipid or cholesterol levels are high, you are over 50, or you are a high risk for heart disease, you may need your cholesterol levels checked more frequently.Ongoing high lipid and cholesterol levels should be treated with medicines if diet and exercise are not effective.  If you smoke, find out from your caregiver how to quit. If you do not use tobacco, do not start.  If you are pregnant, do not drink alcohol. If you are breastfeeding, be very cautious about drinking alcohol. If you are not pregnant and choose to drink alcohol, do not exceed 1 drink per day. One drink is considered to be 12 ounces (355 mL) of beer, 5 ounces (148 mL) of wine, or 1.5 ounces (44 mL) of liquor.  Avoid use of street drugs. Do not share needles with anyone. Ask for help if you need support or instructions about stopping the use of drugs.  High blood pressure causes heart disease and increases the risk of stroke. Blood pressure should be checked at least every 1 to 2 years. Ongoing high blood pressure should be treated with medicines,  if weight loss and exercise are not effective.  If you are 2 to 57 years old, ask your caregiver if you should take aspirin to prevent strokes.  Diabetes screening involves taking a blood sample to check your fasting blood sugar level. This should be done once every 3 years, after age 52, if you are within normal weight and without risk factors for diabetes. Testing should be considered at a  younger age or be carried out more frequently if you are overweight and have at least 1 risk factor for diabetes.  Breast cancer screening is essential preventative care for women. You should practice "breast self-awareness." This means understanding the normal appearance and feel of your breasts and may include breast self-examination. Any changes detected, no matter how small, should be reported to a caregiver. Women in their 14s and 30s should have a clinical breast exam (CBE) by a caregiver as part of a regular health exam every 1 to 3 years. After age 83, women should have a CBE every year. Starting at age 48, women should consider having a mammogram (breast X-ray) every year. Women who have a family history of breast cancer should talk to their caregiver about genetic screening. Women at a high risk of breast cancer should talk to their caregiver about having an MRI and a mammogram every year.  The Pap test is a screening test for cervical cancer. Women should have a Pap test starting at age 2. Between ages 46 and 39, Pap tests should be repeated every 2 years. Beginning at age 27, you should have a Pap test every 3 years as long as the past 3 Pap tests have been normal. If you had a hysterectomy for a problem that was not cancer or a condition that could lead to cancer, then you no longer need Pap tests. If you are between ages 48 and 65, and you have had normal Pap tests going back 10 years, you no longer need Pap tests. If you have had past treatment for cervical cancer or a condition that could lead to cancer, you need Pap tests and screening for cancer for at least 20 years after your treatment. If Pap tests have been discontinued, risk factors (such as a new sexual partner) need to be reassessed to determine if screening should be resumed. Some women have medical problems that increase the chance of getting cervical cancer. In these cases, your caregiver may recommend more frequent screening and Pap  tests.  The human papillomavirus (HPV) test is an additional test that may be used for cervical cancer screening. The HPV test looks for the virus that can cause the cell changes on the cervix. The cells collected during the Pap test can be tested for HPV. The HPV test could be used to screen women aged 57 years and older, and should be used in women of any age who have unclear Pap test results. After the age of 16, women should have HPV testing at the same frequency as a Pap test.  Colorectal cancer can be detected and often prevented. Most routine colorectal cancer screening begins at the age of 81 and continues through age 78. However, your caregiver may recommend screening at an earlier age if you have risk factors for colon cancer. On a yearly basis, your caregiver may provide home test kits to check for hidden blood in the stool. Use of a small camera at the end of a tube, to directly examine the colon (sigmoidoscopy or colonoscopy), can detect  the earliest forms of colorectal cancer. Talk to your caregiver about this at age 3, when routine screening begins. Direct examination of the colon should be repeated every 5 to 10 years through age 58, unless early forms of pre-cancerous polyps or small growths are found.  Hepatitis C blood testing is recommended for all people born from [birth year] through 1965 and any individual with known risks for hepatitis C.  Practice safe sex. Use condoms and avoid high-risk sexual practices to reduce the spread of sexually transmitted infections (STIs). Sexually active women aged 27 and younger should be checked for Chlamydia, which is a common sexually transmitted infection. Older women with new or multiple partners should also be tested for Chlamydia. Testing for other STIs is recommended if you are sexually active and at increased risk.  Osteoporosis is a disease in which the bones lose minerals and strength with aging. This can result in serious bone fractures. The risk  of osteoporosis can be identified using a bone density scan. Women ages 56 and over and women at risk for fractures or osteoporosis should discuss screening with their caregivers. Ask your caregiver whether you should be taking a calcium supplement or vitamin D to reduce the rate of osteoporosis.  Menopause can be associated with physical symptoms and risks. Hormone replacement therapy is available to decrease symptoms and risks. You should talk to your caregiver about whether hormone replacement therapy is right for you.  Use sunscreen with a sun protection factor (SPF) of 30 or greater. Apply sunscreen liberally and repeatedly throughout the day. You should seek shade when your shadow is shorter than you. Protect yourself by wearing long sleeves, pants, a wide-brimmed hat, and sunglasses year round, whenever you are outdoors.  Notify your caregiver of new moles or changes in moles, especially if there is a change in shape or color. Also notify your caregiver if a mole is larger than the size of a pencil eraser.  Stay current with your immunizations. Document Released: 09/01/2010 Document Revised: 05/11/2011 Document Reviewed: 09/01/2010 [facility] Patient Information 2014 [place], [place].

## 2013-01-05 ENCOUNTER — Other Ambulatory Visit: Payer: Self-pay

## 2013-10-31 LAB — HM MAMMOGRAPHY: HM Mammogram: NORMAL

## 2013-11-09 ENCOUNTER — Other Ambulatory Visit: Payer: Self-pay | Admitting: Obstetrics and Gynecology

## 2013-11-10 LAB — CYTOLOGY - PAP

## 2013-11-16 IMAGING — CR DG CHEST 2V
2 series · 2 of 2 positions shown · non-contrast
Comparison: None.

CLINICAL DATA: Preop for left knee surgery

CHEST - 2 VIEW

[w chest pa]
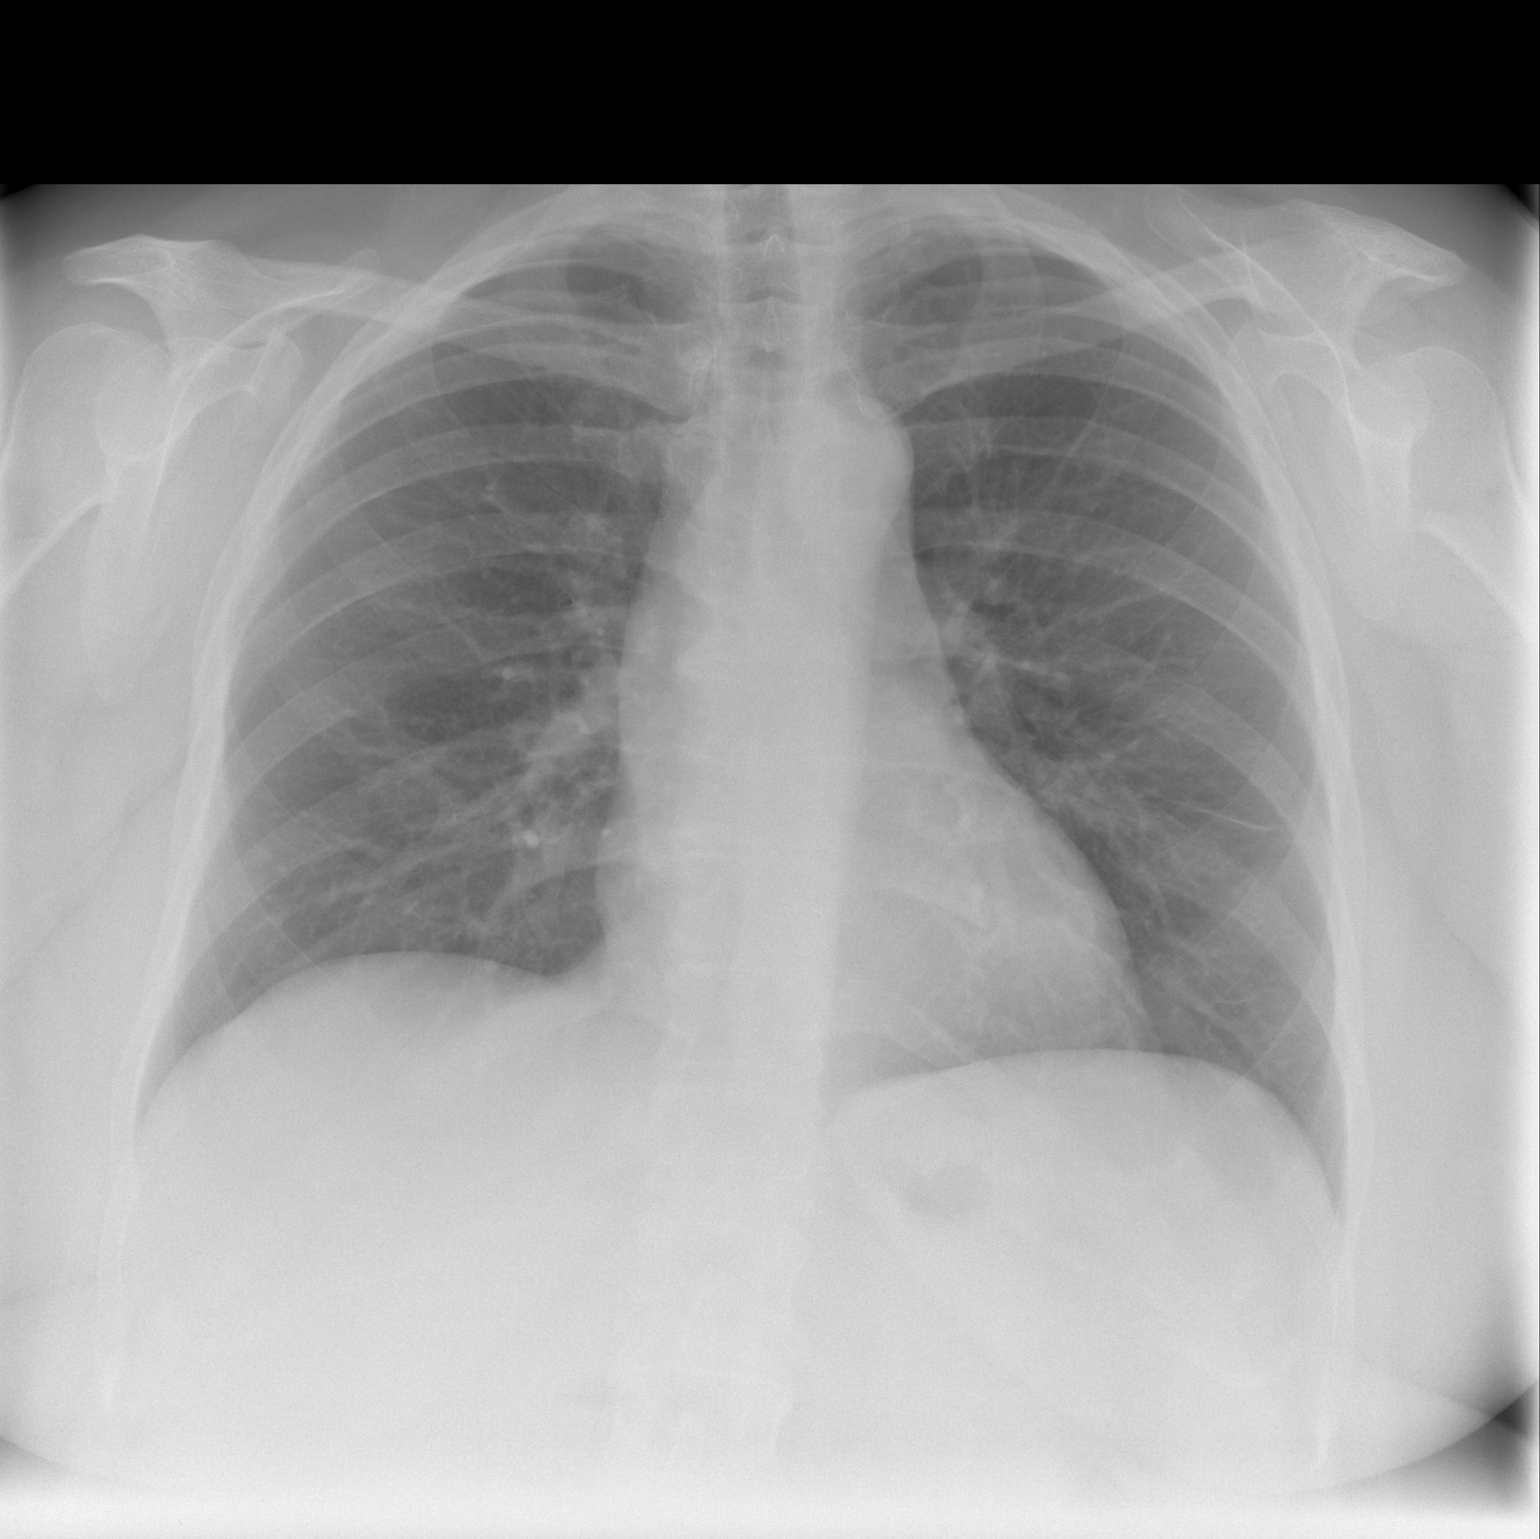

[w chest lat]
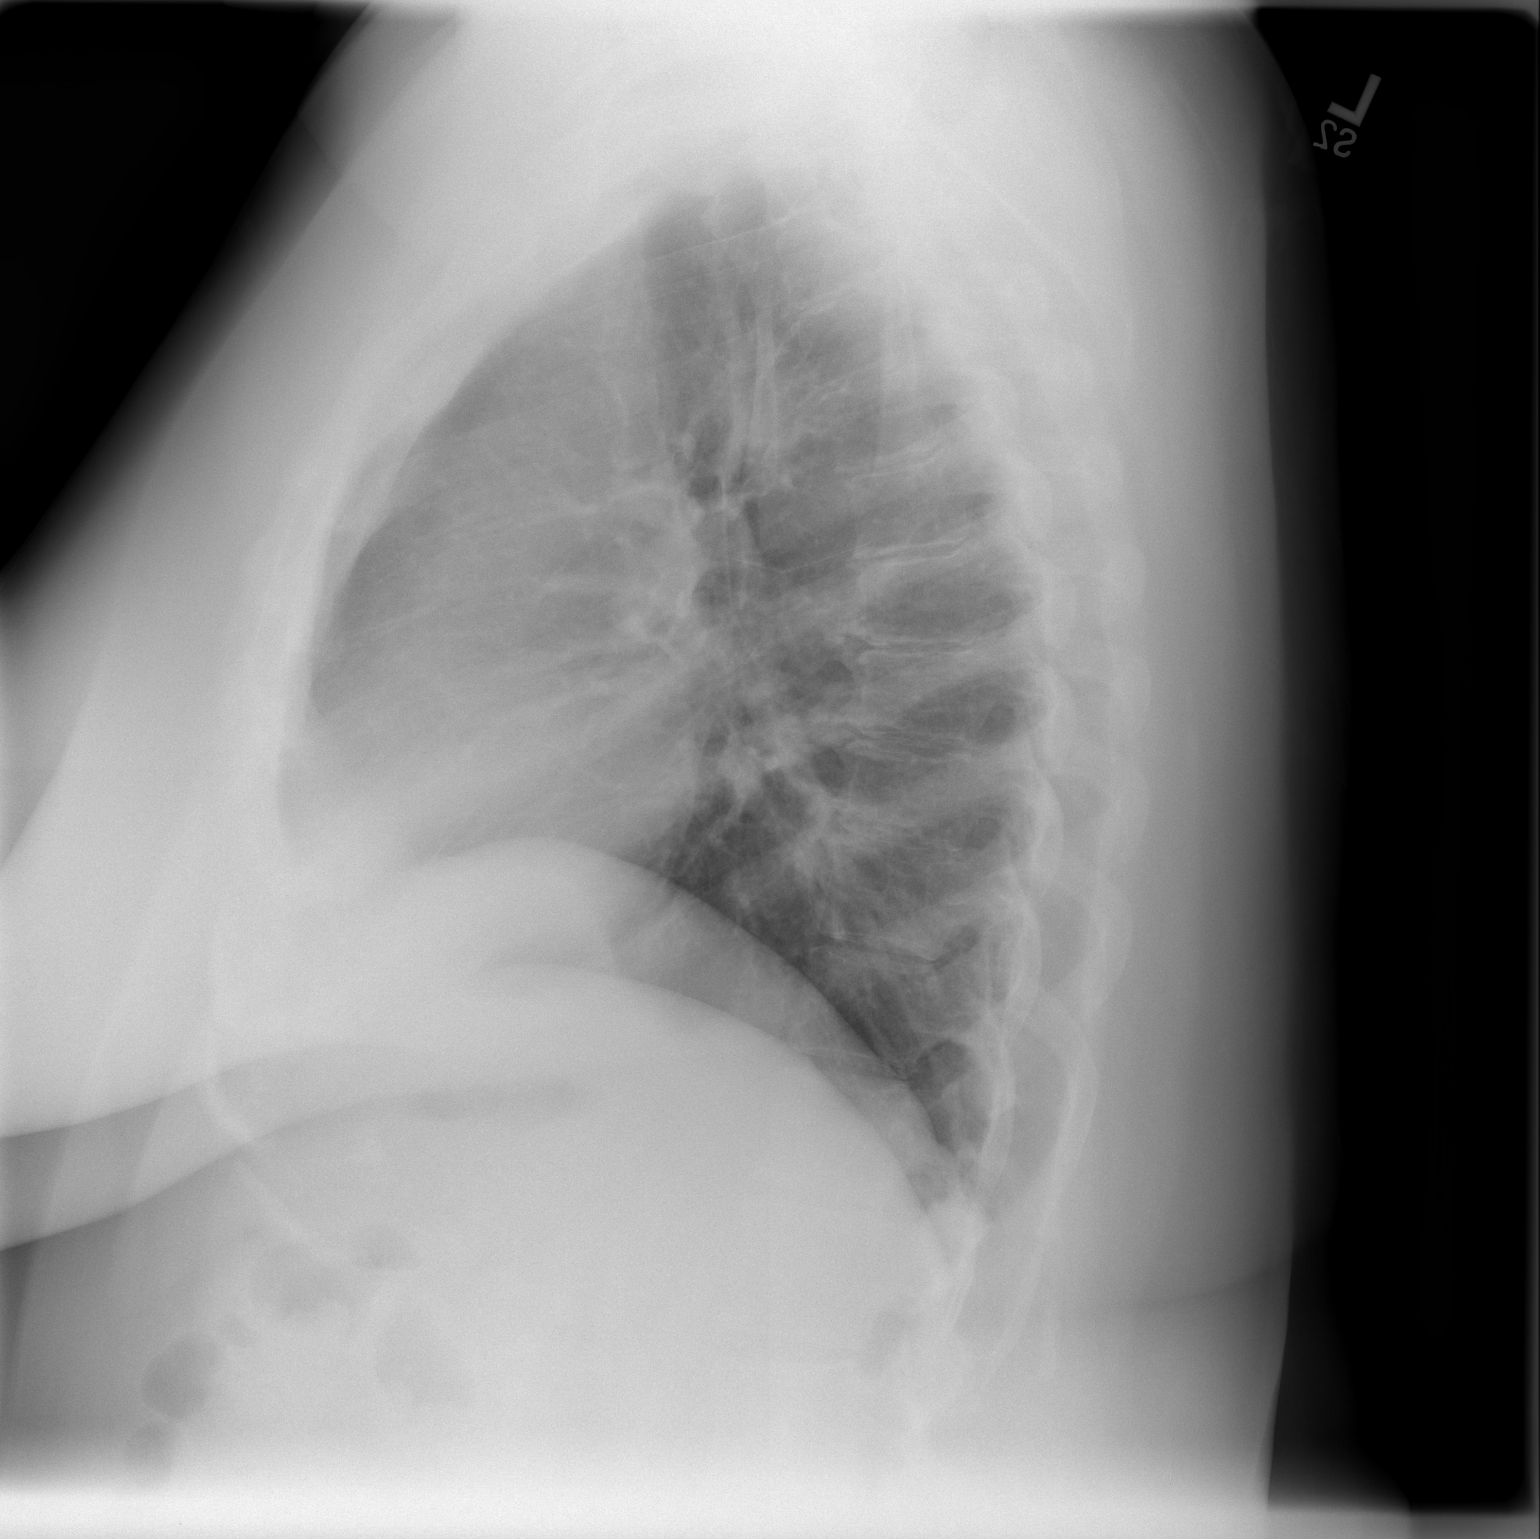

[2 of 2 positions shown; findings below may reference images not displayed]

FINDINGS: The lungs are clear.  Mediastinal contours appear normal.
The heart is within normal limits in size.  There are mild
degenerative changes in the thoracic spine.
IMPRESSION: No active lung disease.

## 2013-11-30 ENCOUNTER — Other Ambulatory Visit: Payer: Self-pay | Admitting: Internal Medicine

## 2013-12-31 DIAGNOSIS — B029 Zoster without complications: Secondary | ICD-10-CM

## 2013-12-31 HISTORY — DX: Zoster without complications: B02.9

## 2014-01-11 ENCOUNTER — Encounter: Payer: Self-pay | Admitting: Internal Medicine

## 2014-01-11 ENCOUNTER — Other Ambulatory Visit: Payer: Self-pay | Admitting: Internal Medicine

## 2014-01-12 ENCOUNTER — Other Ambulatory Visit: Payer: Self-pay

## 2014-01-12 MED ORDER — MONTELUKAST SODIUM 10 MG PO TABS: 10.0000 mg | ORAL_TABLET | Freq: Every day | ORAL | Status: DC

## 2014-02-14 ENCOUNTER — Encounter: Payer: Self-pay | Admitting: Internal Medicine

## 2014-02-14 ENCOUNTER — Ambulatory Visit (INDEPENDENT_AMBULATORY_CARE_PROVIDER_SITE_OTHER): Payer: 59 | Admitting: Internal Medicine

## 2014-02-14 VITALS — BP 122/84 | HR 81 | Temp 97.6°F | Ht 68.0 in | Wt 295.5 lb

## 2014-02-14 DIAGNOSIS — R739 Hyperglycemia, unspecified: Secondary | ICD-10-CM

## 2014-02-14 DIAGNOSIS — B0229 Other postherpetic nervous system involvement: Secondary | ICD-10-CM

## 2014-02-14 DIAGNOSIS — Z0000 Encounter for general adult medical examination without abnormal findings: Secondary | ICD-10-CM

## 2014-02-14 MED ORDER — GABAPENTIN 300 MG PO CAPS: 600.0000 mg | ORAL_CAPSULE | Freq: Three times a day (TID) | ORAL | Status: DC

## 2014-02-14 NOTE — Progress Notes (Signed)
Pre visit review using our clinic review tool, if applicable. No additional management support is needed unless otherwise documented below in the visit note. 

## 2014-02-14 NOTE — Patient Instructions (Addendum)
It was good to see you today.  We have reviewed your prior records including labs and tests today  Test(s) ordered today. Return when you are fasting. Your results will be released to [facility] (or called to you) after review, usually within 72hours after test completion. If any changes need to be made, you will be notified at that same time.  Medications reviewed and updated Increase gabapentin to 600mg  three times daily -Your prescription(s) have been submitted to your local [facility]. Please take as directed and contact our office if you believe you are having problem(s) with the medication(s). Postherpetic Neuralgia Postherpetic neuralgia (PHN) is nerve pain that occurs after a shingles infection. Shingles is a painful rash that appears on one side of the body, usually on your trunk or face. Shingles is caused by the varicella-zoster virus. This is the same virus that causes chickenpox. In people who have had chickenpox, the virus can resurface years later and cause shingles. You may have PHN if you continue to have pain for 3 months after your shingles rash has gone away. PHN appears in the same area where you had the shingles rash. For most people, PHN goes away within 1 year.  Getting a vaccination for shingles can prevent PHN. This vaccine is recommended for people older than 50. It may prevent shingles and may also lower your risk of PHN if you do get shingles. CAUSES PHN is caused by damage to your nerves from the varicella-zoster virus. This damage makes your nerves overly sensitive.  RISK FACTORS Aging is the biggest risk factor for developing PHN. Most people who get PHN are older than 72. Other risk factors include:  Having very bad pain before your shingles rash starts.  Having a very bad rash.  Having shingles in the nerve that supplies your face and eye (trigeminal nerve). SIGNS AND SYMPTOMS Pain is the main symptom of PHN. The pain is often very bad and may be described as  stabbing, burning, or feeling like an electric shock. The pain may come and go or may be there all the time. Pain may be triggered by light touches on the skin or changes in temperature. You may have itching along with the pain. DIAGNOSIS  Your health care provider may diagnose PHN based on your symptoms and your history of shingles. Lab studies and other diagnostic tests are usually not needed. TREATMENT  There is no cure for PHN. Treatment for PHN will focus on pain relief. Over-the-counter pain relievers do not usually relieve PHN pain. You may need to work with a pain specialist. Treatment may include:  Antidepressant medicines to help with pain and improve sleep.  Antiseizure medicines to relieve nerve pain.  Strong pain relievers (opioids).  A numbing patch worn on the skin (lidocaine patch). HOME CARE INSTRUCTIONS It may take a long time to recover from PHN. Work closely with your health care provider, and have a good support system at home.   Take all medicines as directed by your health care provider.  Wear loose, comfortable clothing.  Cover sensitive areas with a dressing to reduce friction from clothing rubbing on the area.  If cold does not make your pain worse, try applying a cool compress or cooling gel pack to the area.  Talk to your health care provider if you feel depressed or desperate. Living with long-term pain can be depressing. SEEK MEDICAL CARE IF:  Your medicine is not helping.  You are struggling to manage your pain at home.  Document Released: 05/09/2002 Document Revised: 07/03/2013 Document Reviewed: 02/07/2013 [facility] Patient Information 2015 [place], [place]. This information is not intended to replace advice given to you by your health care provider. Make sure you discuss any questions you have with your health care provider.

## 2014-02-14 NOTE — Progress Notes (Signed)
   Subjective:    [name], female    DOB: December 18, 1955, 58 y.o.   MRN: [id number]  HPI  Patient is here for follow up shingles left arm, continued pain Reviewed chronic medical issues and interval medical events  Past Medical History  Diagnosis Date  . GERD (gastroesophageal reflux disease)   . Hypertension   . Migraines   . OSA (obstructive sleep apnea)     uses cpap nightly  . Arthritis      knee s/p TKR 2/13  . Allergic rhinitis     Review of Systems  Constitutional: Negative for fever, fatigue and unexpected weight change.  Respiratory: Negative for cough and shortness of breath.   Skin: Positive for rash (L arm, improved since onset 3 weeks ago, continued pain).       Objective:   Physical Exam  BP 122/84 mmHg  Pulse 81  Temp(Src) 97.6 F (36.4 C) (Oral)  Ht 5\' 8"  (1.727 m)  Wt 295 lb 8 oz (134.038 kg)  BMI 44.94 kg/m2  SpO2 98% Wt Readings from Last 3 Encounters:  02/14/14 295 lb 8 oz (134.038 kg)  12/28/12 289 lb (131.09 kg)  03/31/12 297 lb (134.718 kg)   Constitutional: She appears well-developed and well-nourished. No distress.  Neck: Normal range of motion. Neck supple. No JVD present. No thyromegaly present.  Cardiovascular: Normal rate, regular rhythm and normal heart sounds.  No murmur heard. No BLE edema. Pulmonary/Chest: Effort normal and breath sounds normal. No respiratory distress. She has no wheezes.  Skin: Resolving dermatomal herpetic rash left arm Psychiatric: She has a normal mood and affect. Her behavior is normal. Judgment and thought content normal.   Lab Results  Component Value Date   WBC 8.0 12/28/2012   HGB 14.1 12/28/2012   HCT 41.5 12/28/2012   PLT 303.0 12/28/2012   GLUCOSE 114* 12/28/2012   CHOL 195 12/28/2012   TRIG 125.0 12/28/2012   HDL 49.80 12/28/2012   LDLDIRECT 144.5 01/19/2012   LDLCALC 120* 12/28/2012   ALT 19 12/28/2012   AST 19 12/28/2012   NA 140 12/28/2012   K 4.6 12/28/2012   CL 104  12/28/2012   CREATININE 0.7 12/28/2012   BUN 13 12/28/2012   CO2 29 12/28/2012   TSH 1.37 12/28/2012   INR 1.00 04/07/2011   HGBA1C 6.5 12/28/2012    Ct Maxillofacial Ltd Wo Cm  12/28/2012   CLINICAL DATA:  Left sinus pressure and drainage. Headaches.  EXAM: CT PARANASAL SINUS WITHOUT CONTRAST  TECHNIQUE: Multidetector CT images of the paranasal sinuses were obtained using the standard protocol without intravenous contrast.  COMPARISON:  None.  FINDINGS: Screening examination of the paranasal sinuses demonstrates no significant mucosal thickening or fluid levels. The nasal septum is midline. The nasal cavity is clear. Limited imaging of the brain is unremarkable.  IMPRESSION: Negative limited CT of the sinuses.   Electronically Signed   By: Lawrence Santiago M.D.   On: 12/28/2012 15:49       Assessment & Plan:   Shingles left arm and dermatomal distribution. Now with postherpetic neuralgia following antiviral, prednisone and narcotic treatment as prescribed by [facility] (care everywhere encounter reviewed)  Labs ordered for upcoming CPX next month Takes gabapentin for chronic neuropathic back pain, will titrate up dose of same now for neuropathic pain control related to new condition If symptoms unimproved, will consider change to Lyrica or resume Oxy as needed

## 2014-02-15 ENCOUNTER — Encounter: Payer: Self-pay | Admitting: Internal Medicine

## 2014-02-15 DIAGNOSIS — G4733 Obstructive sleep apnea (adult) (pediatric): Secondary | ICD-10-CM

## 2014-02-15 DIAGNOSIS — Z9989 Dependence on other enabling machines and devices: Secondary | ICD-10-CM

## 2014-02-19 ENCOUNTER — Other Ambulatory Visit (INDEPENDENT_AMBULATORY_CARE_PROVIDER_SITE_OTHER): Payer: 59

## 2014-02-19 DIAGNOSIS — Z0000 Encounter for general adult medical examination without abnormal findings: Secondary | ICD-10-CM

## 2014-02-19 DIAGNOSIS — R739 Hyperglycemia, unspecified: Secondary | ICD-10-CM

## 2014-02-19 LAB — BASIC METABOLIC PANEL
BUN: 19 mg/dL (ref 6–23)
CHLORIDE: 104 meq/L (ref 96–112)
CO2: 27 mEq/L (ref 19–32)
CREATININE: 0.6 mg/dL (ref 0.4–1.2)
Calcium: 9 mg/dL (ref 8.4–10.5)
GFR: 106.98 mL/min (ref >60.00)
Glucose, Bld: 122 mg/dL — ABNORMAL HIGH (ref 70–99)
POTASSIUM: 4.3 meq/L (ref 3.5–5.1)
Sodium: 138 mEq/L (ref 135–145)

## 2014-02-19 LAB — CBC WITH DIFFERENTIAL/PLATELET
BASOS ABS: 0 10*3/uL (ref 0.0–0.1)
Basophils Relative: 0.5 % (ref 0.0–3.0)
Eosinophils Absolute: 0.3 10*3/uL (ref 0.0–0.7)
Eosinophils Relative: 4.7 % (ref 0.0–5.0)
HEMATOCRIT: 40.4 % (ref 36.0–46.0)
Hemoglobin: 13.3 g/dL (ref 12.0–15.0)
LYMPHS ABS: 1.9 10*3/uL (ref 0.7–4.0)
Lymphocytes Relative: 25.7 % (ref 12.0–46.0)
MCHC: 33 g/dL (ref 30.0–36.0)
MCV: 85.7 fl (ref 78.0–100.0)
MONO ABS: 0.3 10*3/uL (ref 0.1–1.0)
Monocytes Relative: 4.6 % (ref 3.0–12.0)
Neutro Abs: 4.7 10*3/uL (ref 1.4–7.7)
Neutrophils Relative %: 64.5 % (ref 43.0–77.0)
PLATELETS: 243 10*3/uL (ref 150.0–400.0)
RBC: 4.72 Mil/uL (ref 3.87–5.11)
RDW: 15 % (ref 11.5–15.5)
WBC: 7.3 10*3/uL (ref 4.0–10.5)

## 2014-02-19 LAB — URINALYSIS, ROUTINE W REFLEX MICROSCOPIC
BILIRUBIN URINE: NEGATIVE
HGB URINE DIPSTICK: NEGATIVE
Ketones, ur: NEGATIVE
Leukocytes, UA: NEGATIVE
Nitrite: NEGATIVE
RBC / HPF: NONE SEEN (ref 0–?)
Specific Gravity, Urine: 1.02 (ref 1.000–1.030)
TOTAL PROTEIN, URINE-UPE24: NEGATIVE
URINE GLUCOSE: NEGATIVE
Urobilinogen, UA: 0.2 (ref 0.0–1.0)
pH: 6 (ref 5.0–8.0)

## 2014-02-19 LAB — LIPID PANEL
CHOLESTEROL: 181 mg/dL (ref 0–200)
HDL: 41.9 mg/dL (ref >39.00)
LDL CALC: 116 mg/dL — AB (ref 0–99)
NonHDL: 139.1
TRIGLYCERIDES: 115 mg/dL (ref 0.0–149.0)
Total CHOL/HDL Ratio: 4
VLDL: 23 mg/dL (ref 0.0–40.0)

## 2014-02-19 LAB — HEPATIC FUNCTION PANEL
ALT: 16 U/L (ref 0–35)
AST: 16 U/L (ref 0–37)
Albumin: 3.7 g/dL (ref 3.5–5.2)
Alkaline Phosphatase: 93 U/L (ref 39–117)
Bilirubin, Direct: 0.1 mg/dL (ref 0.0–0.3)
Total Bilirubin: 1 mg/dL (ref 0.2–1.2)
Total Protein: 7.2 g/dL (ref 6.0–8.3)

## 2014-02-19 LAB — HEMOGLOBIN A1C: Hgb A1c MFr Bld: 6.8 % — ABNORMAL HIGH (ref 4.6–6.5)

## 2014-02-20 LAB — TSH: TSH: 1.21 u[IU]/mL (ref 0.35–4.50)

## 2014-03-28 ENCOUNTER — Encounter: Payer: Self-pay | Admitting: Pulmonary Disease

## 2014-03-28 ENCOUNTER — Ambulatory Visit (INDEPENDENT_AMBULATORY_CARE_PROVIDER_SITE_OTHER): Payer: 59 | Admitting: Pulmonary Disease

## 2014-03-28 VITALS — BP 124/68 | HR 91 | Temp 97.0°F | Ht 68.0 in | Wt 303.8 lb

## 2014-03-28 DIAGNOSIS — G4733 Obstructive sleep apnea (adult) (pediatric): Secondary | ICD-10-CM | POA: Insufficient documentation

## 2014-03-28 NOTE — Assessment & Plan Note (Signed)
The patient has a history of significant obstructive sleep apnea dating back 20 years, and has been on a bilevel device since that time. He is currently on her original machine, and obviously needs a new device. She feels that she has done very well with C Pap, and is reasonably rested in the mornings upon arising. She feels that she is not symptomatic during the day, and her Epworth score today is only 2. I will order her a new bilevel device, and use the automatic setting to optimize her pressure. I have also encouraged her to work aggressively on weight loss, and to keep up with her mask changes and supplies.

## 2014-03-28 NOTE — Progress Notes (Signed)
Subjective:    [name], female    DOB: 1955-12-15, 59 y.o.   MRN: [id number]  HPI The patient is a 59 year old female who I've been asked to see for management of obstructive sleep apnea. She tells me that she was diagnosed about 15-20 years ago with significant sleep apneas, and has been on bilevel since that time. She feels that she has done very well with the device, and is satisfied with her sleep and daytime alertness. She unfortunately is using her same she which is quite aged, and has not kept up with her supplies on a regular basis. The patient tells me that she has gained about 20-25 pounds over the last 2 years, but her Epworth score today is only 2.   Sleep Questionnaire What time do you typically go to bed?( Between what hours) 11p 11p at 1418 on 03/28/14 by Inge Rise, CMA How long does it take you to fall asleep? 2-3 hrs 2-3 hrs at 1418 on 03/28/14 by Inge Rise, CMA How many times during the night do you wake up? 2 2 at 1418 on 03/28/14 by Inge Rise, [place] What time do you get out of bed to start your day? 0800 0800 at 1418 on 03/28/14 by Inge Rise, CMA Do you drive or operate heavy machinery in your occupation? No No at 1418 on 03/28/14 by Inge Rise, CMA How much has your weight changed (up or down) over the past two years? (In pounds) 20 lb (9.072 kg) 20 lb (9.072 kg) at 1418 on 03/28/14 by Inge Rise, CMA Have you ever had a sleep study before? Yes Yes at 1418 on 03/28/14 by [place] If yes, location of study? [place] at 1418 on 03/28/14 by Inge Rise, CMA If yes, date of study? 15-20 years ago 15-20 years ago at 6 on 03/28/14 by Inge Rise, CMA Do you currently use CPAP? Yes Yes at 1418 on 03/28/14 by Inge Rise, CMA If so, what pressure? not sure not sure at 1418 on 03/28/14 by Inge Rise, CMA Do you wear oxygen at any time? No No at 1418 on 03/28/14 by Inge Rise,  CMA   Review of Systems  Constitutional: Negative for fever and unexpected weight change.  HENT: Positive for congestion, ear pain and sneezing. Negative for dental problem, nosebleeds, postnasal drip, rhinorrhea, sinus pressure, sore throat and trouble swallowing.   Eyes: Negative for redness and itching.  Respiratory: Positive for cough and shortness of breath. Negative for chest tightness and wheezing.   Cardiovascular: Negative for palpitations and leg swelling.  Gastrointestinal: Negative for nausea and vomiting.  Genitourinary: Negative for dysuria.  Musculoskeletal: Negative for joint swelling.  Skin: Negative for rash.  Neurological: Negative for headaches.  Hematological: Does not bruise/bleed easily.  Psychiatric/Behavioral: Negative for dysphoric mood. The patient is not nervous/anxious.        Objective:   Physical Exam Constitutional:  Morbidly obese female, no acute distress  HENT:  Nares patent without discharge  Oropharynx without exudate, palate and uvula are elongated  Eyes:  Perrla, eomi, no scleral icterus  Neck:  No JVD, no TMG  Cardiovascular:  Normal rate, regular rhythm, no rubs or gallops.  No murmurs        Intact distal pulses  Pulmonary :  Normal breath sounds, no stridor or respiratory distress   No rales, rhonchi, or wheezing  Abdominal:  Soft, nondistended, bowel  sounds present.  No tenderness noted.   Musculoskeletal:  + lower extremity edema noted.  Lymph Nodes:  No cervical lymphadenopathy noted  Skin:  No cyanosis noted  Neurologic:  Alert, appropriate, moves all 4 extremities without obvious deficit.         Assessment & Plan:

## 2014-03-28 NOTE — Patient Instructions (Signed)
Will order you a new bilevel device, and use the auto setting for self adjustment.  Let us know if you are not comfortable on this setting. Work on weight loss If you are doing well, will see you on a yearly basis.  However, please call us if having issues with your sleep apnea.

## 2014-03-29 ENCOUNTER — Encounter: Payer: Self-pay | Admitting: Internal Medicine

## 2014-04-16 ENCOUNTER — Encounter: Payer: Self-pay | Admitting: Internal Medicine

## 2014-04-17 ENCOUNTER — Other Ambulatory Visit: Payer: Self-pay

## 2014-04-17 MED ORDER — OMEPRAZOLE 20 MG PO CPDR: 20.0000 mg | DELAYED_RELEASE_CAPSULE | Freq: Every day | ORAL | Status: DC

## 2014-04-17 MED ORDER — MELOXICAM 15 MG PO TABS: 15.0000 mg | ORAL_TABLET | Freq: Every day | ORAL | Status: DC

## 2014-04-17 MED ORDER — AMLODIPINE BESYLATE 5 MG PO TABS: 5.0000 mg | ORAL_TABLET | Freq: Every day | ORAL | Status: DC

## 2014-05-24 ENCOUNTER — Other Ambulatory Visit: Payer: Self-pay | Admitting: Internal Medicine

## 2014-07-23 ENCOUNTER — Ambulatory Visit (INDEPENDENT_AMBULATORY_CARE_PROVIDER_SITE_OTHER): Payer: 59 | Admitting: Internal Medicine

## 2014-07-23 ENCOUNTER — Encounter: Payer: Self-pay | Admitting: Internal Medicine

## 2014-07-23 VITALS — BP 122/82 | HR 83 | Temp 98.0°F | Resp 18 | Ht 68.0 in | Wt 303.4 lb

## 2014-07-23 DIAGNOSIS — Z1211 Encounter for screening for malignant neoplasm of colon: Secondary | ICD-10-CM

## 2014-07-23 DIAGNOSIS — R739 Hyperglycemia, unspecified: Secondary | ICD-10-CM | POA: Insufficient documentation

## 2014-07-23 DIAGNOSIS — I10 Essential (primary) hypertension: Secondary | ICD-10-CM | POA: Diagnosis not present

## 2014-07-23 DIAGNOSIS — Z0000 Encounter for general adult medical examination without abnormal findings: Secondary | ICD-10-CM | POA: Diagnosis not present

## 2014-07-23 MED ORDER — VENLAFAXINE HCL ER 75 MG PO CP24: 75.0000 mg | ORAL_CAPSULE | Freq: Every day | ORAL | Status: DC

## 2014-07-23 MED ORDER — MONTELUKAST SODIUM 10 MG PO TABS: ORAL_TABLET | ORAL | Status: DC

## 2014-07-23 NOTE — Assessment & Plan Note (Signed)
Lab Results  Component Value Date   HGBA1C 6.8* 02/19/2014   Weight trends reviewed The patient is asked to make an attempt to improve diet and exercise patterns to aid in medical management of this problem. Recheck a1c in 10/2014 (82mo for weight loss efforts per pt request)

## 2014-07-23 NOTE — Assessment & Plan Note (Signed)
Resumed norvasc 01/2011 The current medical regimen is effective;  continue present plan and medications.  Also encouraged to continue diet and exercise efforts to control BP and weight  BP Readings from Last 3 Encounters:  07/23/14 122/82  03/28/14 124/68  02/14/14 122/84

## 2014-07-23 NOTE — Progress Notes (Signed)
Subjective:    [name], female    DOB: 1955/11/26, 59 y.o.   MRN: [id number]  HPI  patient is here today for annual physical. Patient feels well and has no complaints. Also reviewed chronic conditions, interval events and current concerns  Past Medical History  Diagnosis Date  . GERD (gastroesophageal reflux disease)   . Hypertension   . Migraines   . OSA (obstructive sleep apnea)     uses cpap nightly  . Arthritis      knee s/p TKR 2/13  . Allergic rhinitis   . Shingles outbreak 12/2013    L arm/torso   Family History  Problem Relation Age of Onset  . Arthritis Mother   . Breast cancer Mother   . Arthritis Father   . Alcohol abuse Other   . Diabetes Other   . Emphysema Mother   . Asthma Mother   . Heart disease Mother   . Cancer Father    History  Substance Use Topics  . Smoking status: Never Smoker   . Smokeless tobacco: Not on file  . Alcohol Use: 0.6 oz/week    1 Glasses of wine per week     Comment: rare social    Review of Systems  Constitutional: Negative for fatigue and unexpected weight change.  Respiratory: Negative for cough, shortness of breath and wheezing.   Cardiovascular: Negative for chest pain, palpitations and leg swelling.  Gastrointestinal: Negative for nausea, abdominal pain and diarrhea.  Neurological: Negative for dizziness, weakness, light-headedness and headaches.  Psychiatric/Behavioral: Negative for dysphoric mood. The patient is not nervous/anxious.   All other systems reviewed and are negative.      Objective:    Physical Exam  Constitutional: She is oriented to person, place, and time. She appears well-developed and well-nourished. No distress.  MO  HENT:  Head: Normocephalic and atraumatic.  Right Ear: External ear normal.  Left Ear: External ear normal.  Nose: Nose normal.  Mouth/Throat: Oropharynx is clear and moist. No oropharyngeal exudate.  Eyes: EOM are normal. Pupils are equal, round, and  reactive to light. Right eye exhibits no discharge. Left eye exhibits no discharge. No scleral icterus.  Neck: Normal range of motion. Neck supple. No JVD present. No tracheal deviation present. No thyromegaly present.  thick  Cardiovascular: Normal rate, regular rhythm, normal heart sounds and intact distal pulses.  Exam reveals no friction rub.   No murmur heard. Pulmonary/Chest: Effort normal and breath sounds normal. No respiratory distress. She has no wheezes. She has no rales. She exhibits no tenderness.  Abdominal: Soft. Bowel sounds are normal. She exhibits no distension and no mass. There is no tenderness. There is no rebound and no guarding.  Genitourinary:  Defer to gyn  Musculoskeletal: Normal range of motion.  No gross deformities  Lymphadenopathy:    She has no cervical adenopathy.  Neurological: She is alert and oriented to person, place, and time. She has normal reflexes. No cranial nerve deficit.  Skin: Skin is warm and dry. No rash noted. She is not diaphoretic. No erythema.  Psychiatric: She has a normal mood and affect. Her behavior is normal. Judgment and thought content normal.  Nursing note and vitals reviewed.   BP 122/82 mmHg  Pulse 83  Temp(Src) 98 F (36.7 C) (Oral)  Resp 18  Ht 5\' 8"  (1.727 m)  Wt 303 lb 6.4 oz (137.621 kg)  BMI 46.14 kg/m2  SpO2 97% Wt Readings from Last 3 Encounters:  07/23/14 303  lb 6.4 oz (137.621 kg)  03/28/14 303 lb 12.8 oz (137.803 kg)  02/14/14 295 lb 8 oz (134.038 kg)    Lab Results  Component Value Date   WBC 7.3 02/19/2014   HGB 13.3 02/19/2014   HCT 40.4 02/19/2014   PLT 243.0 02/19/2014   GLUCOSE 122* 02/19/2014   CHOL 181 02/19/2014   TRIG 115.0 02/19/2014   HDL 41.90 02/19/2014   LDLDIRECT 144.5 01/19/2012   LDLCALC 116* 02/19/2014   ALT 16 02/19/2014   AST 16 02/19/2014   NA 138 02/19/2014   K 4.3 02/19/2014   CL 104 02/19/2014   CREATININE 0.6 02/19/2014   BUN 19 02/19/2014   CO2 27 02/19/2014   TSH  1.21 02/19/2014   INR 1.00 04/07/2011   HGBA1C 6.8* 02/19/2014    Ct Maxillofacial Ltd Wo Cm  12/28/2012   CLINICAL DATA:  Left sinus pressure and drainage. Headaches.  EXAM: CT PARANASAL SINUS WITHOUT CONTRAST  TECHNIQUE: Multidetector CT images of the paranasal sinuses were obtained using the standard protocol without intravenous contrast.  COMPARISON:  None.  FINDINGS: Screening examination of the paranasal sinuses demonstrates no significant mucosal thickening or fluid levels. The nasal septum is midline. The nasal cavity is clear. Limited imaging of the brain is unremarkable.  IMPRESSION: Negative limited CT of the sinuses.   Electronically Signed   By: Lawrence Santiago M.D.   On: 12/28/2012 15:49       Assessment & Plan:   CPX/z00.00 - Patient has been counseled on age-appropriate routine health concerns for screening and prevention. These are reviewed and up-to-date. Immunizations are up-to-date or declined. Labs ordered and reviewed.  Problem List Items Addressed This Visit    Hyperglycemia    Lab Results  Component Value Date   HGBA1C 6.8* 02/19/2014   Weight trends reviewed The patient is asked to make an attempt to improve diet and exercise patterns to aid in medical management of this problem. Recheck a1c in 10/2014 (63mo for weight loss efforts per pt request)      Relevant Orders   Hemoglobin A1c   Hypertension    Resumed norvasc 01/2011 The current medical regimen is effective;  continue present plan and medications.  Also encouraged to continue diet and exercise efforts to control BP and weight  BP Readings from Last 3 Encounters:  07/23/14 122/82  03/28/14 124/68  02/14/14 122/84        Morbid obesity    Wt Readings from Last 3 Encounters:  07/23/14 303 lb 6.4 oz (137.621 kg)  03/28/14 303 lb 12.8 oz (137.803 kg)  02/14/14 295 lb 8 oz (134.038 kg)   Weight changes reviewed The patient is asked to make an attempt to improve diet and exercise patterns to aid  in medical management of this problem.        Other Visit Diagnoses    Routine general medical examination at a health care facility    -  Primary    Special screening for malignant neoplasms, colon        Relevant Orders    Ambulatory referral to Gastroenterology        Gwendolyn Grant, MD

## 2014-07-23 NOTE — Assessment & Plan Note (Signed)
Wt Readings from Last 3 Encounters:  07/23/14 303 lb 6.4 oz (137.621 kg)  03/28/14 303 lb 12.8 oz (137.803 kg)  02/14/14 295 lb 8 oz (134.038 kg)   Weight changes reviewed The patient is asked to make an attempt to improve diet and exercise patterns to aid in medical management of this problem.

## 2014-07-23 NOTE — Patient Instructions (Addendum)
It was good to see you today.  We have reviewed your prior records including labs and tests today  Health Maintenance reviewed - all recommended immunizations and age-appropriate screenings are up-to-date.  we'll make referral to gastroenterology for screening colonoscopy . Our office will contact you regarding appointment(s) once made.  Test(s) ordered today for end of August. Your results will be released to MyChart (or called to you) after review, usually within 72hours after test completion. If any changes need to be made, you will be notified at that same time.  Medications reviewed and updated, no changes recommended at this time. Refill on medication(s) as discussed today.  Please schedule followup in 12 months for annual exam and labs, call sooner if problems.  Work on lifestyle changes as discussed (low fat, low carb, increased protein diet; improved exercise efforts; weight loss) to control sugar, blood pressure and cholesterol levels and/or reduce risk of developing other medical problems. Look into [email or web address] or other type of food journal to assist you in this process.  Health Maintenance Adopting a healthy lifestyle and getting preventive care can go a long way to promote health and wellness. Talk with your health care provider about what schedule of regular examinations is right for you. This is a good chance for you to check in with your provider about disease prevention and staying healthy. In between checkups, there are plenty of things you can do on your own. Experts have done a lot of research about which lifestyle changes and preventive measures are most likely to keep you healthy. Ask your health care provider for more information. WEIGHT AND DIET  Eat a healthy diet  Be sure to include plenty of vegetables, fruits, low-fat dairy products, and lean protein.  Do not eat a lot of foods high in solid fats, added sugars, or salt.  Get regular exercise. This is one of  the most important things you can do for your health.  Most adults should exercise for at least 150 minutes each week. The exercise should increase your heart rate and make you sweat (moderate-intensity exercise).  Most adults should also do strengthening exercises at least twice a week. This is in addition to the moderate-intensity exercise.  Maintain a healthy weight  Body mass index (BMI) is a measurement that can be used to identify possible weight problems. It estimates body fat based on height and weight. Your health care provider can help determine your BMI and help you achieve or maintain a healthy weight.  For females 77 years of age and older:   A BMI below 18.5 is considered underweight.  A BMI of 18.5 to 24.9 is normal.  A BMI of 25 to 29.9 is considered overweight.  A BMI of 30 and above is considered obese.  Watch levels of cholesterol and blood lipids  You should start having your blood tested for lipids and cholesterol at 59 years of age, then have this test every 5 years.  You may need to have your cholesterol levels checked more often if:  Your lipid or cholesterol levels are high.  You are older than 59 years of age.  You are at high risk for heart disease.  CANCER SCREENING   Lung Cancer  Lung cancer screening is recommended for adults 30-39 years old who are at high risk for lung cancer because of a history of smoking.  A yearly low-dose CT scan of the lungs is recommended for people who:  Currently smoke.  Have quit within the  past 15 years.  Have at least a 30-pack-year history of smoking. A pack year is smoking an average of one pack of cigarettes a day for 1 year.  Yearly screening should continue until it has been 15 years since you quit.  Yearly screening should stop if you develop a health problem that would prevent you from having lung cancer treatment.  Breast Cancer  Practice breast self-awareness. This means understanding how your  breasts normally appear and feel.  It also means doing regular breast self-exams. Let your health care provider know about any changes, no matter how small.  If you are in your 20s or 30s, you should have a clinical breast exam (CBE) by a health care provider every 1-3 years as part of a regular health exam.  If you are 10 or older, have a CBE every year. Also consider having a breast X-ray (mammogram) every year.  If you have a family history of breast cancer, talk to your health care provider about genetic screening.  If you are at high risk for breast cancer, talk to your health care provider about having an MRI and a mammogram every year.  Breast cancer gene (BRCA) assessment is recommended for women who have family members with BRCA-related cancers. BRCA-related cancers include:  Breast.  Ovarian.  Tubal.  Peritoneal cancers.  Results of the assessment will determine the need for genetic counseling and BRCA1 and BRCA2 testing. Cervical Cancer Routine pelvic examinations to screen for cervical cancer are no longer recommended for nonpregnant women who are considered low risk for cancer of the pelvic organs (ovaries, uterus, and vagina) and who do not have symptoms. A pelvic examination may be necessary if you have symptoms including those associated with pelvic infections. Ask your health care provider if a screening pelvic exam is right for you.   The Pap test is the screening test for cervical cancer for women who are considered at risk.  If you had a hysterectomy for a problem that was not cancer or a condition that could lead to cancer, then you no longer need Pap tests.  If you are older than 65 years, and you have had normal Pap tests for the past 10 years, you no longer need to have Pap tests.  If you have had past treatment for cervical cancer or a condition that could lead to cancer, you need Pap tests and screening for cancer for at least 20 years after your  treatment.  If you no longer get a Pap test, assess your risk factors if they change (such as having a new sexual partner). This can affect whether you should start being screened again.  Some women have medical problems that increase their chance of getting cervical cancer. If this is the case for you, your health care provider may recommend more frequent screening and Pap tests.  The human papillomavirus (HPV) test is another test that may be used for cervical cancer screening. The HPV test looks for the virus that can cause cell changes in the cervix. The cells collected during the Pap test can be tested for HPV.  The HPV test can be used to screen women 35 years of age and older. Getting tested for HPV can extend the interval between normal Pap tests from three to five years.  An HPV test also should be used to screen women of any age who have unclear Pap test results.  After 59 years of age, women should have HPV testing as often as  Pap tests.  Colorectal Cancer  This type of cancer can be detected and often prevented.  Routine colorectal cancer screening usually begins at 59 years of age and continues through 59 years of age.  Your health care provider may recommend screening at an earlier age if you have risk factors for colon cancer.  Your health care provider may also recommend using home test kits to check for hidden blood in the stool.  A small camera at the end of a tube can be used to examine your colon directly (sigmoidoscopy or colonoscopy). This is done to check for the earliest forms of colorectal cancer.  Routine screening usually begins at age 9.  Direct examination of the colon should be repeated every 5-10 years through 59 years of age. However, you may need to be screened more often if early forms of precancerous polyps or small growths are found. Skin Cancer  Check your skin from head to toe regularly.  Tell your health care provider about any new moles or  changes in moles, especially if there is a change in a mole's shape or color.  Also tell your health care provider if you have a mole that is larger than the size of a pencil eraser.  Always use sunscreen. Apply sunscreen liberally and repeatedly throughout the day.  Protect yourself by wearing long sleeves, pants, a wide-brimmed hat, and sunglasses whenever you are outside. HEART DISEASE, DIABETES, AND HIGH BLOOD PRESSURE   Have your blood pressure checked at least every 1-2 years. High blood pressure causes heart disease and increases the risk of stroke.  If you are between 19 years and 73 years old, ask your health care provider if you should take aspirin to prevent strokes.  Have regular diabetes screenings. This involves taking a blood sample to check your fasting blood sugar level.  If you are at a normal weight and have a low risk for diabetes, have this test once every three years after 59 years of age.  If you are overweight and have a high risk for diabetes, consider being tested at a younger age or more often. PREVENTING INFECTION  Hepatitis B  If you have a higher risk for hepatitis B, you should be screened for this virus. You are considered at high risk for hepatitis B if:  You were born in a country where hepatitis B is common. Ask your health care provider which countries are considered high risk.  Your parents were born in a high-risk country, and you have not been immunized against hepatitis B (hepatitis B vaccine).  You have HIV or AIDS.  You use needles to inject street drugs.  You live with someone who has hepatitis B.  You have had sex with someone who has hepatitis B.  You get hemodialysis treatment.  You take certain medicines for conditions, including cancer, organ transplantation, and autoimmune conditions. Hepatitis C  Blood testing is recommended for:  Everyone born from [birth year] through 1965.  Anyone with known risk factors for hepatitis  C. Sexually transmitted infections (STIs)  You should be screened for sexually transmitted infections (STIs) including gonorrhea and chlamydia if:  You are sexually active and are younger than 59 years of age.  You are older than 59 years of age and your health care provider tells you that you are at risk for this type of infection.  Your sexual activity has changed since you were last screened and you are at an increased risk for chlamydia or gonorrhea. Ask your  health care provider if you are at risk.  If you do not have HIV, but are at risk, it may be recommended that you take a prescription medicine daily to prevent HIV infection. This is called pre-exposure prophylaxis (PrEP). You are considered at risk if:  You are sexually active and do not regularly use condoms or know the HIV status of your partner(s).  You take drugs by injection.  You are sexually active with a partner who has HIV. Talk with your health care provider about whether you are at high risk of being infected with HIV. If you choose to begin PrEP, you should first be tested for HIV. You should then be tested every 3 months for as long as you are taking PrEP.  PREGNANCY   If you are premenopausal and you may become pregnant, ask your health care provider about preconception counseling.  If you may become pregnant, take 400 to 800 micrograms (mcg) of folic acid every day.  If you want to prevent pregnancy, talk to your health care provider about birth control (contraception). OSTEOPOROSIS AND MENOPAUSE   Osteoporosis is a disease in which the bones lose minerals and strength with aging. This can result in serious bone fractures. Your risk for osteoporosis can be identified using a bone density scan.  If you are 82 years of age or older, or if you are at risk for osteoporosis and fractures, ask your health care provider if you should be screened.  Ask your health care provider whether you should take a calcium or  vitamin D supplement to lower your risk for osteoporosis.  Menopause may have certain physical symptoms and risks.  Hormone replacement therapy may reduce some of these symptoms and risks. Talk to your health care provider about whether hormone replacement therapy is right for you.  HOME CARE INSTRUCTIONS   Schedule regular health, dental, and eye exams.  Stay current with your immunizations.   Do not use any tobacco products including cigarettes, chewing tobacco, or electronic cigarettes.  If you are pregnant, do not drink alcohol.  If you are breastfeeding, limit how much and how often you drink alcohol.  Limit alcohol intake to no more than 1 drink per day for nonpregnant women. One drink equals 12 ounces of beer, 5 ounces of wine, or 1 ounces of hard liquor.  Do not use street drugs.  Do not share needles.  Ask your health care provider for help if you need support or information about quitting drugs.  Tell your health care provider if you often feel depressed.  Tell your health care provider if you have ever been abused or do not feel safe at home. Document Released: 09/01/2010 Document Revised: 07/03/2013 Document Reviewed: 01/18/2013 [facility] Patient Information 2015 [place], [place]. This information is not intended to replace advice given to you by your health care provider. Make sure you discuss any questions you have with your health care provider.

## 2014-07-23 NOTE — Progress Notes (Signed)
Pre visit review using our clinic review tool, if applicable. No additional management support is needed unless otherwise documented below in the visit note. 

## 2014-08-08 ENCOUNTER — Ambulatory Visit (AMBULATORY_SURGERY_CENTER): Payer: Self-pay | Admitting: *Deleted

## 2014-08-08 VITALS — Ht 68.0 in | Wt 299.8 lb

## 2014-08-08 DIAGNOSIS — Z1211 Encounter for screening for malignant neoplasm of colon: Secondary | ICD-10-CM

## 2014-08-08 MED ORDER — NA SULFATE-K SULFATE-MG SULF 17.5-3.13-1.6 GM/177ML PO SOLN: ORAL | Status: DC

## 2014-08-08 NOTE — Progress Notes (Signed)
No allergies to eggs or soy. No problems with anesthesia.  Pt given Emmi instructions for colonoscopy  No oxygen use  No diet drug use  

## 2014-08-22 ENCOUNTER — Ambulatory Visit (AMBULATORY_SURGERY_CENTER): Payer: 59 | Admitting: Internal Medicine

## 2014-08-22 ENCOUNTER — Encounter: Payer: Self-pay | Admitting: Internal Medicine

## 2014-08-22 VITALS — BP 133/81 | HR 80 | Temp 97.9°F | Resp 34 | Ht 68.0 in | Wt 299.0 lb

## 2014-08-22 DIAGNOSIS — D125 Benign neoplasm of sigmoid colon: Secondary | ICD-10-CM | POA: Diagnosis not present

## 2014-08-22 DIAGNOSIS — Z1211 Encounter for screening for malignant neoplasm of colon: Secondary | ICD-10-CM

## 2014-08-22 DIAGNOSIS — K635 Polyp of colon: Secondary | ICD-10-CM

## 2014-08-22 MED ORDER — SODIUM CHLORIDE 0.9 % IV SOLN: 500.0000 mL | INTRAVENOUS | Status: DC

## 2014-08-22 NOTE — Progress Notes (Signed)
Report to PACU, RN, vss, BBS= Clear.  

## 2014-08-22 NOTE — Op Note (Signed)
[place]  [address] [place], [id number]   COLONOSCOPY PROCEDURE REPORT  PATIENT: Michelle Sanchez, Michelle Sanchez  MR#: [id number] BIRTHDATE: February 12, 1956 , 76  yrs. old GENDER: female ENDOSCOPIST: Lafayette Dragon, MD REFERRED JS:RPRXYVO Asa Lente, M.D. PROCEDURE DATE:  08/22/2014 PROCEDURE:   Colonoscopy, screening and Colonoscopy with cold biopsy polypectomy First Screening Colonoscopy - Avg.  risk and is 50 yrs.  old or older Yes.  Prior Negative Screening - Now for repeat screening. N/A  History of Adenoma - Now for follow-up colonoscopy & has been > or = to 3 yrs.  N/A  Polyps removed today? Yes ASA CLASS:   Class II INDICATIONS:Screening for colonic neoplasia and Colorectal Neoplasm Risk Assessment for this procedure is average risk. MEDICATIONS: Monitored anesthesia care and Propofol 200 mg IV  DESCRIPTION OF PROCEDURE:   After the risks benefits and alternatives of the procedure were thoroughly explained, informed consent was obtained.  The digital rectal exam revealed no abnormalities of the rectum.   The LB [id number]  endoscope was introduced through the anus and advanced to the cecum, which was identified by both the appendix and ileocecal valve. No adverse events experienced.   The quality of the prep was good.  (MoviPrep was used)  The instrument was then slowly withdrawn as the colon was fully examined. Estimated blood loss is zero unless otherwise noted in this procedure report.      COLON FINDINGS: A firm sessile polyp measuring 3 mm in size was found in the sigmoid colon.  A polypectomy was performed with cold forceps.  Retroflexed views revealed no abnormalities. The time to cecum = 6.36 Withdrawal time = 7.17   The scope was withdrawn and the procedure completed. COMPLICATIONS: There were no immediate complications.  ENDOSCOPIC IMPRESSION: 1. Sessile polyp was found in the sigmoid colon; polypectomy was performed with cold forceps 2. spasm  sigmoid colon  RECOMMENDATIONS: 1.  Await pathology results 2.  High-fiber diet Recall colonoscopy pending path report  eSigned:  Lafayette Dragon, MD 08/22/2014 4:00 PM   cc:

## 2014-08-22 NOTE — Progress Notes (Signed)
Called to room to assist during endoscopic procedure.  Patient ID and intended procedure confirmed with present staff. Received instructions for my participation in the procedure from the performing physician.  

## 2014-08-22 NOTE — Patient Instructions (Signed)
FOLLOW DISCHARGE INSTRUCTIONS (BLUE AND GREEN SHEETS).YOU HAD AN ENDOSCOPIC PROCEDURE TODAY AT [facility]:   Refer to the procedure report that was given to you for any specific questions about what was found during the examination.  If the procedure report does not answer your questions, please call your gastroenterologist to clarify.  If you requested that your care partner not be given the details of your procedure findings, then the procedure report has been included in a sealed envelope for you to review at your convenience later.  YOU SHOULD EXPECT: Some feelings of bloating in the abdomen. Passage of more gas than usual.  Walking can help get rid of the air that was put into your GI tract during the procedure and reduce the bloating. If you had a lower endoscopy (such as a colonoscopy or flexible sigmoidoscopy) you may notice spotting of blood in your stool or on the toilet paper. If you underwent a bowel prep for your procedure, you may not have a normal bowel movement for a few days.  Please Note:  You might notice some irritation and congestion in your nose or some drainage.  This is from the oxygen used during your procedure.  There is no need for concern and it should clear up in a day or so.  SYMPTOMS TO REPORT IMMEDIATELY:   Following lower endoscopy (colonoscopy or flexible sigmoidoscopy):  Excessive amounts of blood in the stool  Significant tenderness or worsening of abdominal pains  Swelling of the abdomen that is new, acute  Fever of 100F or higher   For urgent or emergent issues, a gastroenterologist can be reached at any hour by calling [phone].   DIET: Your first meal following the procedure should be a small meal and then it is ok to progress to your normal diet. Heavy or fried foods are harder to digest and may make you feel nauseous or bloated.  Likewise, meals heavy in dairy and vegetables can increase bloating.  Drink plenty of fluids but you  should avoid alcoholic beverages for 24 hours.  ACTIVITY:  You should plan to take it easy for the rest of today and you should NOT DRIVE or use heavy machinery until tomorrow (because of the sedation medicines used during the test).    FOLLOW UP: Our staff will call the number listed on your records the next business day following your procedure to check on you and address any questions or concerns that you may have regarding the information given to you following your procedure. If we do not reach you, we will leave a message.  However, if you are feeling well and you are not experiencing any problems, there is no need to return our call.  We will assume that you have returned to your regular daily activities without incident.  If any biopsies were taken you will be contacted by phone or by letter within the next 1-3 weeks.  Please call us at [phone] if you have not heard about the biopsies in 3 weeks.    SIGNATURES/CONFIDENTIALITY: You and/or your care partner have signed paperwork which will be entered into your electronic medical record.  These signatures attest to the fact that that the information above on your After Visit Summary has been reviewed and is understood.  Full responsibility of the confidentiality of this discharge information lies with you and/or your care-partner.  Polyp and high fiber diet information given.

## 2014-08-23 ENCOUNTER — Telehealth: Payer: Self-pay

## 2014-08-23 NOTE — Telephone Encounter (Signed)
Left message on answering machine. 

## 2014-08-27 ENCOUNTER — Encounter: Payer: Self-pay | Admitting: Internal Medicine

## 2014-09-02 ENCOUNTER — Other Ambulatory Visit: Payer: Self-pay | Admitting: Internal Medicine

## 2015-03-29 ENCOUNTER — Ambulatory Visit: Payer: 59 | Admitting: Pulmonary Disease

## 2015-10-15 ENCOUNTER — Telehealth: Payer: Self-pay | Admitting: Internal Medicine

## 2015-10-15 MED ORDER — MELOXICAM 15 MG PO TABS: 15.0000 mg | ORAL_TABLET | Freq: Every day | ORAL | 0 refills | Status: DC

## 2015-10-15 MED ORDER — AMLODIPINE BESYLATE 5 MG PO TABS: 5.0000 mg | ORAL_TABLET | Freq: Every day | ORAL | 0 refills | Status: DC

## 2015-10-15 MED ORDER — OMEPRAZOLE 20 MG PO CPDR: 20.0000 mg | DELAYED_RELEASE_CAPSULE | Freq: Every day | ORAL | 0 refills | Status: DC

## 2015-10-15 MED ORDER — MONTELUKAST SODIUM 10 MG PO TABS: ORAL_TABLET | ORAL | 0 refills | Status: DC

## 2015-10-15 NOTE — Telephone Encounter (Signed)
Spoke with [facility] and they informed it was okay to send in less than a year supply. 3 months sent, pt aware

## 2015-10-15 NOTE — Telephone Encounter (Signed)
amLODipine (NORVASC) 5 MG tablet [id number]  meloxicam (MOBIC) 15 MG tablet [id number]  omeprazole (PRILOSEC) 20 MG capsule [id number]  montelukast (SINGULAIR) 10 MG tablet [id number]   Patient has an appt in October to est.  She is asking for a fill of this to [facility]. She states that they onmyl accept one year prescriptions.

## 2015-12-25 ENCOUNTER — Encounter: Payer: Self-pay | Admitting: Internal Medicine

## 2015-12-25 DIAGNOSIS — R739 Hyperglycemia, unspecified: Secondary | ICD-10-CM

## 2015-12-25 DIAGNOSIS — Z0000 Encounter for general adult medical examination without abnormal findings: Secondary | ICD-10-CM

## 2015-12-26 ENCOUNTER — Other Ambulatory Visit (INDEPENDENT_AMBULATORY_CARE_PROVIDER_SITE_OTHER): Payer: 59

## 2015-12-26 DIAGNOSIS — R739 Hyperglycemia, unspecified: Secondary | ICD-10-CM | POA: Diagnosis not present

## 2015-12-26 DIAGNOSIS — Z0000 Encounter for general adult medical examination without abnormal findings: Secondary | ICD-10-CM | POA: Diagnosis not present

## 2015-12-26 LAB — COMPREHENSIVE METABOLIC PANEL
ALBUMIN: 4.4 g/dL (ref 3.5–5.2)
ALK PHOS: 102 U/L (ref 39–117)
ALT: 12 U/L (ref 0–35)
AST: 12 U/L (ref 0–37)
BILIRUBIN TOTAL: 0.5 mg/dL (ref 0.2–1.2)
BUN: 19 mg/dL (ref 6–23)
CALCIUM: 9.8 mg/dL (ref 8.4–10.5)
CO2: 28 mEq/L (ref 19–32)
Chloride: 104 mEq/L (ref 96–112)
Creatinine, Ser: 0.75 mg/dL (ref 0.40–1.20)
GFR: 83.75 mL/min (ref >60.00)
Glucose, Bld: 125 mg/dL — ABNORMAL HIGH (ref 70–99)
POTASSIUM: 4.4 meq/L (ref 3.5–5.1)
Sodium: 140 mEq/L (ref 135–145)
TOTAL PROTEIN: 8 g/dL (ref 6.0–8.3)

## 2015-12-26 LAB — CBC WITH DIFFERENTIAL/PLATELET
BASOS ABS: 0 10*3/uL (ref 0.0–0.1)
Basophils Relative: 0.3 % (ref 0.0–3.0)
EOS PCT: 3.8 % (ref 0.0–5.0)
Eosinophils Absolute: 0.3 10*3/uL (ref 0.0–0.7)
HEMATOCRIT: 43.3 % (ref 36.0–46.0)
HEMOGLOBIN: 14.8 g/dL (ref 12.0–15.0)
LYMPHS ABS: 2.3 10*3/uL (ref 0.7–4.0)
LYMPHS PCT: 26.9 % (ref 12.0–46.0)
MCHC: 34.1 g/dL (ref 30.0–36.0)
MCV: 84.1 fl (ref 78.0–100.0)
MONOS PCT: 6.9 % (ref 3.0–12.0)
Monocytes Absolute: 0.6 10*3/uL (ref 0.1–1.0)
NEUTROS PCT: 62.1 % (ref 43.0–77.0)
Neutro Abs: 5.2 10*3/uL (ref 1.4–7.7)
Platelets: 288 10*3/uL (ref 150.0–400.0)
RBC: 5.15 Mil/uL — AB (ref 3.87–5.11)
RDW: 14.2 % (ref 11.5–15.5)
WBC: 8.4 10*3/uL (ref 4.0–10.5)

## 2015-12-26 LAB — LIPID PANEL
Cholesterol: 183 mg/dL (ref 0–200)
HDL: 52.9 mg/dL (ref >39.00)
LDL CALC: 106 mg/dL — AB (ref 0–99)
NONHDL: 129.66
Total CHOL/HDL Ratio: 3
Triglycerides: 118 mg/dL (ref 0.0–149.0)
VLDL: 23.6 mg/dL (ref 0.0–40.0)

## 2015-12-26 LAB — TSH: TSH: 1.32 u[IU]/mL (ref 0.35–4.50)

## 2015-12-26 LAB — HEMOGLOBIN A1C: HEMOGLOBIN A1C: 6.3 % (ref 4.6–6.5)

## 2015-12-31 ENCOUNTER — Ambulatory Visit (INDEPENDENT_AMBULATORY_CARE_PROVIDER_SITE_OTHER): Payer: 59 | Admitting: Internal Medicine

## 2015-12-31 ENCOUNTER — Encounter: Payer: Self-pay | Admitting: Internal Medicine

## 2015-12-31 VITALS — BP 124/84 | HR 93 | Temp 98.0°F | Resp 16 | Ht 68.0 in | Wt 276.0 lb

## 2015-12-31 DIAGNOSIS — F419 Anxiety disorder, unspecified: Secondary | ICD-10-CM

## 2015-12-31 DIAGNOSIS — K219 Gastro-esophageal reflux disease without esophagitis: Secondary | ICD-10-CM

## 2015-12-31 DIAGNOSIS — Z0000 Encounter for general adult medical examination without abnormal findings: Secondary | ICD-10-CM

## 2015-12-31 DIAGNOSIS — Z23 Encounter for immunization: Secondary | ICD-10-CM

## 2015-12-31 DIAGNOSIS — M170 Bilateral primary osteoarthritis of knee: Secondary | ICD-10-CM

## 2015-12-31 DIAGNOSIS — R0789 Other chest pain: Secondary | ICD-10-CM | POA: Diagnosis not present

## 2015-12-31 DIAGNOSIS — R7303 Prediabetes: Secondary | ICD-10-CM | POA: Diagnosis not present

## 2015-12-31 DIAGNOSIS — I10 Essential (primary) hypertension: Secondary | ICD-10-CM

## 2015-12-31 DIAGNOSIS — R21 Rash and other nonspecific skin eruption: Secondary | ICD-10-CM

## 2015-12-31 DIAGNOSIS — E119 Type 2 diabetes mellitus without complications: Secondary | ICD-10-CM | POA: Insufficient documentation

## 2015-12-31 DIAGNOSIS — R079 Chest pain, unspecified: Secondary | ICD-10-CM | POA: Insufficient documentation

## 2015-12-31 MED ORDER — MONTELUKAST SODIUM 10 MG PO TABS: ORAL_TABLET | ORAL | 3 refills | Status: DC

## 2015-12-31 MED ORDER — MOMETASONE FUROATE 50 MCG/ACT NA SUSP: 2.0000 | Freq: Every day | NASAL | 3 refills | Status: DC

## 2015-12-31 MED ORDER — OMEPRAZOLE 20 MG PO CPDR
20.0000 mg | DELAYED_RELEASE_CAPSULE | Freq: Every day | ORAL | 0 refills | Status: DC
Start: 2015-12-31 — End: 2016-05-01

## 2015-12-31 MED ORDER — AMLODIPINE BESYLATE 5 MG PO TABS: 5.0000 mg | ORAL_TABLET | Freq: Every day | ORAL | 3 refills | Status: DC

## 2015-12-31 MED ORDER — CLOBETASOL PROP EMOLLIENT BASE 0.05 % EX CREA: 1.0000 "application " | TOPICAL_CREAM | Freq: Two times a day (BID) | CUTANEOUS | 0 refills | Status: DC

## 2015-12-31 MED ORDER — VENLAFAXINE HCL ER 75 MG PO CP24: 75.0000 mg | ORAL_CAPSULE | Freq: Every day | ORAL | 1 refills | Status: DC

## 2015-12-31 NOTE — Assessment & Plan Note (Signed)
Taking singular continue

## 2015-12-31 NOTE — Assessment & Plan Note (Signed)
Takes flexeril as needed Takes meloxicam

## 2015-12-31 NOTE — Assessment & Plan Note (Addendum)
GERD improved with weight loss Omeprazole as needed

## 2015-12-31 NOTE — Assessment & Plan Note (Addendum)
Working on weight loss - has lost weight Changed her diet Not exercising - will start Low sugar/carb diet

## 2015-12-31 NOTE — Assessment & Plan Note (Signed)
Controlled, stable Continue current dose of medication  

## 2015-12-31 NOTE — Progress Notes (Signed)
Pre visit review using our clinic review tool, if applicable. No additional management support is needed unless otherwise documented below in the visit note. 

## 2015-12-31 NOTE — Patient Instructions (Addendum)
All other Health Maintenance issues reviewed.   All recommended immunizations and age-appropriate screenings are up-to-date or discussed.  Flu vaccine administered today.   Medications reviewed and updated.  Changes include a steroid cream for your rash.  Do not use for more than 14 days at one time.   Your prescription(s) have been submitted to your pharmacy. Please take as directed and contact our office if you believe you are having problem(s) with the medication(s).   Please followup in one year  Health Maintenance, Female Adopting a healthy lifestyle and getting preventive care can go a long way to promote health and wellness. Talk with your health care provider about what schedule of regular examinations is right for you. This is a good chance for you to check in with your provider about disease prevention and staying healthy. In between checkups, there are plenty of things you can do on your own. Experts have done a lot of research about which lifestyle changes and preventive measures are most likely to keep you healthy. Ask your health care provider for more information. WEIGHT AND DIET  Eat a healthy diet  Be sure to include plenty of vegetables, fruits, low-fat dairy products, and lean protein.  Do not eat a lot of foods high in solid fats, added sugars, or salt.  Get regular exercise. This is one of the most important things you can do for your health.  Most adults should exercise for at least 150 minutes each week. The exercise should increase your heart rate and make you sweat (moderate-intensity exercise).  Most adults should also do strengthening exercises at least twice a week. This is in addition to the moderate-intensity exercise.  Maintain a healthy weight  Body mass index (BMI) is a measurement that can be used to identify possible weight problems. It estimates body fat based on height and weight. Your health care provider can help determine your BMI and help you  achieve or maintain a healthy weight.  For females 64 years of age and older:   A BMI below 18.5 is considered underweight.  A BMI of 18.5 to 24.9 is normal.  A BMI of 25 to 29.9 is considered overweight.  A BMI of 30 and above is considered obese.  Watch levels of cholesterol and blood lipids  You should start having your blood tested for lipids and cholesterol at 60 years of age, then have this test every 5 years.  You may need to have your cholesterol levels checked more often if:  Your lipid or cholesterol levels are high.  You are older than 60 years of age.  You are at high risk for heart disease.  CANCER SCREENING   Lung Cancer  Lung cancer screening is recommended for adults 65-16 years old who are at high risk for lung cancer because of a history of smoking.  A yearly low-dose CT scan of the lungs is recommended for people who:  Currently smoke.  Have quit within the past 15 years.  Have at least a 30-pack-year history of smoking. A pack year is smoking an average of one pack of cigarettes a day for 1 year.  Yearly screening should continue until it has been 15 years since you quit.  Yearly screening should stop if you develop a health problem that would prevent you from having lung cancer treatment.  Breast Cancer  Practice breast self-awareness. This means understanding how your breasts normally appear and feel.  It also means doing regular breast self-exams. Let  your health care provider know about any changes, no matter how small.  If you are in your 20s or 30s, you should have a clinical breast exam (CBE) by a health care provider every 1-3 years as part of a regular health exam.  If you are 38 or older, have a CBE every year. Also consider having a breast X-ray (mammogram) every year.  If you have a family history of breast cancer, talk to your health care provider about genetic screening.  If you are at high risk for breast cancer, talk to your  health care provider about having an MRI and a mammogram every year.  Breast cancer gene (BRCA) assessment is recommended for women who have family members with BRCA-related cancers. BRCA-related cancers include:  Breast.  Ovarian.  Tubal.  Peritoneal cancers.  Results of the assessment will determine the need for genetic counseling and BRCA1 and BRCA2 testing. Cervical Cancer Your health care provider may recommend that you be screened regularly for cancer of the pelvic organs (ovaries, uterus, and vagina). This screening involves a pelvic examination, including checking for microscopic changes to the surface of your cervix (Pap test). You may be encouraged to have this screening done every 3 years, beginning at age 58.  For women ages 35-65, health care providers may recommend pelvic exams and Pap testing every 3 years, or they may recommend the Pap and pelvic exam, combined with testing for human papilloma virus (HPV), every 5 years. Some types of HPV increase your risk of cervical cancer. Testing for HPV may also be done on women of any age with unclear Pap test results.  Other health care providers may not recommend any screening for nonpregnant women who are considered low risk for pelvic cancer and who do not have symptoms. Ask your health care provider if a screening pelvic exam is right for you.  If you have had past treatment for cervical cancer or a condition that could lead to cancer, you need Pap tests and screening for cancer for at least 20 years after your treatment. If Pap tests have been discontinued, your risk factors (such as having a new sexual partner) need to be reassessed to determine if screening should resume. Some women have medical problems that increase the chance of getting cervical cancer. In these cases, your health care provider may recommend more frequent screening and Pap tests. Colorectal Cancer  This type of cancer can be detected and often  prevented.  Routine colorectal cancer screening usually begins at 60 years of age and continues through 60 years of age.  Your health care provider may recommend screening at an earlier age if you have risk factors for colon cancer.  Your health care provider may also recommend using home test kits to check for hidden blood in the stool.  A small camera at the end of a tube can be used to examine your colon directly (sigmoidoscopy or colonoscopy). This is done to check for the earliest forms of colorectal cancer.  Routine screening usually begins at age 52.  Direct examination of the colon should be repeated every 5-10 years through 60 years of age. However, you may need to be screened more often if early forms of precancerous polyps or small growths are found. Skin Cancer  Check your skin from head to toe regularly.  Tell your health care provider about any new moles or changes in moles, especially if there is a change in a mole's shape or color.  Also tell your  care provider if you have a mole that is larger than the size of a pencil eraser.  Always use sunscreen. Apply sunscreen liberally and repeatedly throughout the day.  Protect yourself by wearing long sleeves, pants, a wide-brimmed hat, and sunglasses whenever you are outside. HEART DISEASE, DIABETES, AND HIGH BLOOD PRESSURE   High blood pressure causes heart disease and increases the risk of stroke. High blood pressure is more likely to develop in:  People who have blood pressure in the high end of the normal range (130-139/85-89 mm Hg).  People who are overweight or obese.  People who are African American.  If you are 18-39 years of age, have your blood pressure checked every 3-5 years. If you are 40 years of age or older, have your blood pressure checked every year. You should have your blood pressure measured twice--once when you are at a hospital or clinic, and once when you are not at a hospital or clinic.  Record the average of the two measurements. To check your blood pressure when you are not at a hospital or clinic, you can use:  An automated blood pressure machine at a pharmacy.  A home blood pressure monitor.  If you are between 55 years and 79 years old, ask your health care provider if you should take aspirin to prevent strokes.  Have regular diabetes screenings. This involves taking a blood sample to check your fasting blood sugar level.  If you are at a normal weight and have a low risk for diabetes, have this test once every three years after 60 years of age.  If you are overweight and have a high risk for diabetes, consider being tested at a younger age or more often. PREVENTING INFECTION  Hepatitis B  If you have a higher risk for hepatitis B, you should be screened for this virus. You are considered at high risk for hepatitis B if:  You were born in a country where hepatitis B is common. Ask your health care provider which countries are considered high risk.  Your parents were born in a high-risk country, and you have not been immunized against hepatitis B (hepatitis B vaccine).  You have HIV or AIDS.  You use needles to inject street drugs.  You live with someone who has hepatitis B.  You have had sex with someone who has hepatitis B.  You get hemodialysis treatment.  You take certain medicines for conditions, including cancer, organ transplantation, and autoimmune conditions. Hepatitis C  Blood testing is recommended for:  Everyone born from 1945 through 1965.  Anyone with known risk factors for hepatitis C. Sexually transmitted infections (STIs)  You should be screened for sexually transmitted infections (STIs) including gonorrhea and chlamydia if:  You are sexually active and are younger than 60 years of age.  You are older than 60 years of age and your health care provider tells you that you are at risk for this type of infection.  Your sexual activity  has changed since you were last screened and you are at an increased risk for chlamydia or gonorrhea. Ask your health care provider if you are at risk.  If you do not have HIV, but are at risk, it may be recommended that you take a prescription medicine daily to prevent HIV infection. This is called pre-exposure prophylaxis (PrEP). You are considered at risk if:  You are sexually active and do not regularly use condoms or know the HIV status of your partner(s).  You take   take drugs by injection.  You are sexually active with a partner who has HIV. Talk with your health care provider about whether you are at high risk of being infected with HIV. If you choose to begin PrEP, you should first be tested for HIV. You should then be tested every 3 months for as long as you are taking PrEP.  PREGNANCY   If you are premenopausal and you may become pregnant, ask your health care provider about preconception counseling.  If you may become pregnant, take 400 to 800 micrograms (mcg) of folic acid every day.  If you want to prevent pregnancy, talk to your health care provider about birth control (contraception). OSTEOPOROSIS AND MENOPAUSE   Osteoporosis is a disease in which the bones lose minerals and strength with aging. This can result in serious bone fractures. Your risk for osteoporosis can be identified using a bone density scan.  If you are 64 years of age or older, or if you are at risk for osteoporosis and fractures, ask your health care provider if you should be screened.  Ask your health care provider whether you should take a calcium or vitamin D supplement to lower your risk for osteoporosis.  Menopause may have certain physical symptoms and risks.  Hormone replacement therapy may reduce some of these symptoms and risks. Talk to your health care provider about whether hormone replacement therapy is right for you.  HOME CARE INSTRUCTIONS   Schedule regular health, dental, and eye  exams.  Stay current with your immunizations.   Do not use any tobacco products including cigarettes, chewing tobacco, or electronic cigarettes.  If you are pregnant, do not drink alcohol.  If you are breastfeeding, limit how much and how often you drink alcohol.  Limit alcohol intake to no more than 1 drink per day for nonpregnant women. One drink equals 12 ounces of beer, 5 ounces of wine, or 1 ounces of hard liquor.  Do not use street drugs.  Do not share needles.  Ask your health care provider for help if you need support or information about quitting drugs.  Tell your health care provider if you often feel depressed.  Tell your health care provider if you have ever been abused or do not feel safe at home.   This information is not intended to replace advice given to you by your health care provider. Make sure you discuss any questions you have with your health care provider.   Document Released: 09/01/2010 Document Revised: 03/09/2014 Document Reviewed: 01/18/2013 Elsevier Interactive Patient Education [employer].

## 2015-12-31 NOTE — Progress Notes (Signed)
Subjective:    [name], female    DOB: 01-02-1956, 60 y.o.   MRN: [id number]  HPI She is here to establish with a new pcp.  She is here for a physical exam.   One month ago she was asleep and woke up with chest  pain.  She had to sit up and could not go back to sleep for 15 minutes.  The pain was like concrete sitting on her chest.  It resolved after 15 minutes.  She denies other episodes.  She did eat chili that night.  She does have gerd, but it is controlled with medication.  She denies cp, sob and palpitations with activity.   She does not exercise.  She has lost weight recently doing the next 56 days diet and knows she needs to continue working on weight loss.  She will start exercising.   She has a rash on her left foot, dorsal surface.  It has been there since May and it itches.  She has tried Gold bond cream, lavender and pepermint oil and it helps temporarily, but returns.     Medications and allergies reviewed with patient and updated if appropriate.  Patient Active Problem List   Diagnosis Date Noted  . Hyperglycemia 07/23/2014  . OSA (obstructive sleep apnea) 03/28/2014  . Lumbar and sacral osteoarthritis 10/20/2012  . Lumbar canal stenosis 10/20/2012  . Morbid obesity ([place])   . Allergic rhinitis, cause unspecified   . Osteoarthritis, knee 02/18/2011  . Hypertension   . Migraines   . GERD (gastroesophageal reflux disease)     Current Outpatient Prescriptions on File Prior to Visit  Medication Sig Dispense Refill  . amLODipine (NORVASC) 5 MG tablet Take 1 tablet (5 mg total) by mouth daily. 90 tablet 0  . cyclobenzaprine (FLEXERIL) 10 MG tablet Take 10 mg by mouth 3 (three) times daily as needed.    . gabapentin (NEURONTIN) 300 MG capsule Take 2 capsules (600 mg total) by mouth 3 (three) times daily. 180 capsule 1  . meloxicam (MOBIC) 15 MG tablet Take 1 tablet (15 mg total) by mouth daily. 90 tablet 0  . montelukast (SINGULAIR) 10 MG tablet Take  1 tablet by mouth at  bedtime 90 tablet 0  . omeprazole (PRILOSEC) 20 MG capsule Take 1 capsule (20 mg total) by mouth daily. 90 capsule 0  . venlafaxine XR (EFFEXOR-XR) 75 MG 24 hr capsule Take 1 capsule (75 mg total) by mouth daily with breakfast.     No current facility-administered medications on file prior to visit.     Past Medical History:  Diagnosis Date  . Allergic rhinitis   . Allergy   . Arthritis     knee s/p TKR 2/13  . Depression   . GERD (gastroesophageal reflux disease)   . Hypertension   . Migraines   . OSA (obstructive sleep apnea)    uses cpap nightly  . Shingles outbreak 12/2013   L arm/torso  . Sleep apnea    cpap  . Spinal stenosis     Past Surgical History:  Procedure Laterality Date  . Knee replacement Left 2012  . KNEE SURGERY     both knees  . TOTAL KNEE ARTHROPLASTY  04/13/2011   Procedure: TOTAL KNEE ARTHROPLASTY;  Surgeon: Gearlean Alf, MD;  Location: [facility];  Service: Orthopedics;  Laterality: Left;    Social History   Social History  . Marital status: Single    Spouse name: N/A  .  Number of children: N/A  . Years of education: N/A   Occupational History  . retired    Social History Main Topics  . Smoking status: Never Smoker  . Smokeless tobacco: Never Used  . Alcohol use 0.6 oz/week    1 Glasses of wine per week     Comment: rare social  . Drug use: No  . Sexual activity: Yes   Other Topics Concern  . Not on file   Social History Narrative  . No narrative on file    Family History  Problem Relation Age of Onset  . Arthritis Mother   . Breast cancer Mother   . Emphysema Mother   . Asthma Mother   . Heart disease Mother   . Arthritis Father   . Cancer Father   . Alcohol abuse Other   . Diabetes Other   . Colon cancer Neg Hx     Review of Systems  Constitutional: Negative for appetite change, fatigue and fever.  HENT: Positive for postnasal drip and tinnitus.   Eyes: Negative for visual disturbance.    Respiratory: Positive for cough (PND). Negative for shortness of breath and wheezing.   Cardiovascular: Positive for chest pain. Negative for palpitations and leg swelling.  Gastrointestinal: Negative for abdominal pain, blood in stool, constipation and diarrhea.  Genitourinary: Negative for dysuria and hematuria.  Musculoskeletal: Positive for back pain.  Skin: Positive for rash (left foot).  Neurological: Positive for headaches (sinuses). Negative for light-headedness.  Psychiatric/Behavioral: Negative for dysphoric mood. The patient is nervous/anxious (controlled).        Objective:   Vitals:   12/31/15 1549  BP: 124/84  Pulse: 93  Resp: 16  Temp: 98 F (36.7 C)   Filed Weights   12/31/15 1549  Weight: 276 lb (125.2 kg)   Body mass index is 41.97 kg/m.   Physical Exam Constitutional: She appears well-developed and well-nourished. No distress.  HENT:  Head: Normocephalic and atraumatic.  Right Ear: External ear normal. Normal ear canal and TM Left Ear: External ear normal.  Normal ear canal and TM Mouth/Throat: Oropharynx is clear and moist.  Eyes: Conjunctivae and EOM are normal.  Neck: Neck supple. No tracheal deviation present. No thyromegaly present.  No carotid bruit  Cardiovascular: Normal rate, regular rhythm and normal heart sounds.   No murmur heard.  No edema. Pulmonary/Chest: Effort normal and breath sounds normal. No respiratory distress. She has no wheezes. She has no rales.  Breast: deferred to Gyn Abdominal: Soft. She exhibits no distension. There is no tenderness.  Lymphadenopathy: She has no cervical adenopathy.  Skin: Skin is warm and dry. She is not diaphoretic.  Psychiatric: She has a normal mood and affect. Her behavior is normal.         Assessment & Plan:   Physical exam: Screening blood work reveiwed Immunizations - flu vaccine today, discussed shingles vaccine Colonoscopy  Up to date  Mammogram  Up to date  - in Jan with gyn Gyn   Up to date  Eye exams  -  Will schedule EKG today for episode of chest pain - WNL Exercise - stressed regular exercise Weight - has lost weight and will continue efforts Skin  - rash, no other concerns - will try steroid cream for rash Substance abuse - none  See Problem List for Assessment and Plan of chronic medical problems.  F/u in one year

## 2016-01-01 DIAGNOSIS — R21 Rash and other nonspecific skin eruption: Secondary | ICD-10-CM | POA: Insufficient documentation

## 2016-01-01 NOTE — Assessment & Plan Note (Signed)
Continue meloxicam

## 2016-01-01 NOTE — Assessment & Plan Note (Signed)
Has lost weight and will continue efforts - has done it with diet changes, will start exercising

## 2016-01-01 NOTE — Assessment & Plan Note (Signed)
Chronic, left dorsal foot Intermittent - not changing, itches Will try steroid cream - use prn for up to 14 days If no change will refer to derm

## 2016-01-01 NOTE — Assessment & Plan Note (Signed)
One episode recently at nigh - lasted 15 min and then resolved - no other episodes No symptoms with activity, but not exercising regularly Has GERD, but has been controlled since weight loss  - ate chili that night Likely atypical GERD Will check EKG today Will monitor for now - if she has any other concerning symptoms she will call and we will pursue a stress test - she  -- she agrees with just monitoring for now

## 2016-01-01 NOTE — Assessment & Plan Note (Addendum)
BP well controlled Current regimen effective and well tolerated Continue current medications at current doses  

## 2016-02-12 ENCOUNTER — Ambulatory Visit (INDEPENDENT_AMBULATORY_CARE_PROVIDER_SITE_OTHER): Payer: 59 | Admitting: Geriatric Medicine

## 2016-02-12 DIAGNOSIS — Z2911 Encounter for prophylactic immunotherapy for respiratory syncytial virus (RSV): Secondary | ICD-10-CM | POA: Diagnosis not present

## 2016-02-12 DIAGNOSIS — Z23 Encounter for immunization: Secondary | ICD-10-CM

## 2016-02-14 ENCOUNTER — Other Ambulatory Visit: Payer: Self-pay | Admitting: Internal Medicine

## 2016-02-14 MED ORDER — MELOXICAM 15 MG PO TABS: 15.0000 mg | ORAL_TABLET | Freq: Every day | ORAL | 1 refills | Status: DC

## 2016-04-13 ENCOUNTER — Ambulatory Visit (INDEPENDENT_AMBULATORY_CARE_PROVIDER_SITE_OTHER): Payer: 59 | Admitting: Internal Medicine

## 2016-04-13 ENCOUNTER — Encounter: Payer: Self-pay | Admitting: Internal Medicine

## 2016-04-13 ENCOUNTER — Other Ambulatory Visit: Payer: 59

## 2016-04-13 VITALS — BP 134/84 | HR 98 | Temp 97.6°F | Resp 16 | Wt 282.0 lb

## 2016-04-13 DIAGNOSIS — R0781 Pleurodynia: Secondary | ICD-10-CM | POA: Diagnosis not present

## 2016-04-13 DIAGNOSIS — J0100 Acute maxillary sinusitis, unspecified: Secondary | ICD-10-CM | POA: Diagnosis not present

## 2016-04-13 DIAGNOSIS — Z1159 Encounter for screening for other viral diseases: Secondary | ICD-10-CM

## 2016-04-13 MED ORDER — CYCLOBENZAPRINE HCL 10 MG PO TABS: 10.0000 mg | ORAL_TABLET | Freq: Three times a day (TID) | ORAL | 0 refills | Status: DC | PRN

## 2016-04-13 MED ORDER — AMOXICILLIN 500 MG PO CAPS: 500.0000 mg | ORAL_CAPSULE | Freq: Three times a day (TID) | ORAL | 0 refills | Status: DC

## 2016-04-13 NOTE — Assessment & Plan Note (Signed)
?   Radiculopathy vs musculoskeletal Unlikely rib contusion or fracture She is taking gabapentin and mobic Will try flexeril which she has taken in the past Call if no improvement Will hold off on imaging unless no improvement

## 2016-04-13 NOTE — Progress Notes (Signed)
Subjective:    [name], female    DOB: 03-Sep-1955, 61 y.o.   MRN: [id number]  HPI She is here for an acute visit.  Overall she does not feel well.  One month ago she had a head cold and it resolved.  Her energy level never improved.  Last week she started having nausea/vomiting and she still has some diarrhea.  She started a couple of days ago with symptoms suggestive of a sinus infection. She has PND and a mild cough.  She has some mild teeth pain.  She does not feel well and just wants to feel better.    Left sided rib pain.  Last week she started to have pain along the lower edge of her left ribs.  She denies injuries or new activities.  She has chronic back pain and wonders it it is from that.  She has increased pain with movement.  Worse in morning when she gets up. Better end of day.  She denies increased pain with cough or deep breaths.    Medications and allergies reviewed with patient and updated if appropriate.  Patient Active Problem List   Diagnosis Date Noted  . Rash and nonspecific skin eruption 01/01/2016  . Anxiety 12/31/2015  . Prediabetes 12/31/2015  . Chest pain 12/31/2015  . OSA (obstructive sleep apnea) 03/28/2014  . Lumbar and sacral osteoarthritis 10/20/2012  . Lumbar canal stenosis 10/20/2012  . Morbid obesity ([place])   . Allergic rhinitis   . Osteoarthritis, knee 02/18/2011  . Hypertension   . Migraines   . GERD (gastroesophageal reflux disease)     Current Outpatient Prescriptions on File Prior to Visit  Medication Sig Dispense Refill  . amLODipine (NORVASC) 5 MG tablet Take 1 tablet (5 mg total) by mouth daily. 90 tablet 3  . Clobetasol Prop Emollient Base (CLOBETASOL PROPIONATE E) 0.05 % emollient cream Apply 1 application topically 2 (two) times daily. 30 g 0  . cyclobenzaprine (FLEXERIL) 10 MG tablet Take 10 mg by mouth 3 (three) times daily as needed.    . gabapentin (NEURONTIN) 300 MG capsule Take 2 capsules (600 mg total) by  mouth 3 (three) times daily. 180 capsule 1  . meloxicam (MOBIC) 15 MG tablet Take 1 tablet (15 mg total) by mouth daily. 90 tablet 1  . mometasone (NASONEX) 50 MCG/ACT nasal spray Place 2 sprays into the nose daily. 51 g 3  . montelukast (SINGULAIR) 10 MG tablet Take 1 tablet by mouth at  bedtime 90 tablet 3  . omeprazole (PRILOSEC) 20 MG capsule Take 1 capsule (20 mg total) by mouth daily. 90 capsule 0  . venlafaxine XR (EFFEXOR-XR) 75 MG 24 hr capsule Take 1 capsule (75 mg total) by mouth daily with breakfast. 90 capsule 1   No current facility-administered medications on file prior to visit.     Past Medical History:  Diagnosis Date  . Allergic rhinitis   . Allergy   . Arthritis     knee s/p TKR 2/13  . Depression   . GERD (gastroesophageal reflux disease)   . Hypertension   . Migraines   . OSA (obstructive sleep apnea)    uses cpap nightly  . Shingles outbreak 12/2013   L arm/torso  . Sleep apnea    cpap  . Spinal stenosis     Past Surgical History:  Procedure Laterality Date  . Knee replacement Left 2012  . KNEE SURGERY     both knees  .  TOTAL KNEE ARTHROPLASTY  04/13/2011   Procedure: TOTAL KNEE ARTHROPLASTY;  Surgeon: Gearlean Alf, MD;  Location: [facility];  Service: Orthopedics;  Laterality: Left;    Social History   Social History  . Marital status: Single    Spouse name: N/A  . Number of children: N/A  . Years of education: N/A   Occupational History  . retired    Social History Main Topics  . Smoking status: Never Smoker  . Smokeless tobacco: Never Used  . Alcohol use 0.6 oz/week    1 Glasses of wine per week     Comment: rare social  . Drug use: No  . Sexual activity: Yes   Other Topics Concern  . Not on file   Social History Narrative  . No narrative on file    Family History  Problem Relation Age of Onset  . Arthritis Mother   . Breast cancer Mother   . Emphysema Mother   . Asthma Mother   . Heart disease Mother   . Arthritis Father    . Cancer Father   . Alcohol abuse Other   . Diabetes Other   . Colon cancer Neg Hx     Review of Systems  Constitutional: Positive for chills and fatigue. Negative for fever.  HENT: Positive for ear pain, postnasal drip and sinus pressure. Negative for sore throat.        Teeth pain  Respiratory: Positive for cough. Negative for shortness of breath and wheezing.   Cardiovascular: Positive for leg swelling (leg swelling the other day - has it on occasion). Negative for chest pain and palpitations.  Gastrointestinal: Positive for diarrhea (few days) and nausea. Negative for abdominal pain.  Neurological: Positive for light-headedness and headaches.       Objective:   Vitals:   04/13/16 1622  BP: 134/84  Pulse: 98  Resp: 16  Temp: 97.6 F (36.4 C)   Filed Weights   04/13/16 1622  Weight: 282 lb (127.9 kg)   Body mass index is 42.88 kg/m.  Wt Readings from Last 3 Encounters:  04/13/16 282 lb (127.9 kg)  12/31/15 276 lb (125.2 kg)  08/22/14 299 lb (135.6 kg)     Physical Exam  Constitutional: She appears well-developed and well-nourished. No distress.  HENT:  Head: Normocephalic and atraumatic.  Right Ear: External ear normal.  Left Ear: External ear normal.  Mouth/Throat: Oropharynx is clear and moist. No oropharyngeal exudate.  B/l ear canals and TM normal  Eyes: Conjunctivae are normal.  Neck: Neck supple. No tracheal deviation present. No thyromegaly present.  Cardiovascular: Normal rate and regular rhythm.   Pulmonary/Chest: Effort normal and breath sounds normal. No respiratory distress. She has no wheezes. She has no rales. She exhibits tenderness (left lower ribs  - lateral and anteriorly).  Musculoskeletal: She exhibits no edema.  Lymphadenopathy:    She has no cervical adenopathy.  Skin: Skin is warm and dry. She is not diaphoretic.          Assessment & Plan:   See Problem List for Assessment and Plan of chronic medical problems.

## 2016-04-13 NOTE — Assessment & Plan Note (Signed)
Past few weeks she has had three different illnesses  - this has warn her down and she is feeling very fatigued She now has a sinus infection -  Likely viral in nature Will treat symptomatically but I will give her an antibiotic to use only if needed.  She wants to avoid the antibiotic as well Rest, fluids stressed

## 2016-04-13 NOTE — Patient Instructions (Signed)
You were given an antibiotic - take it only as needed.   Take the flexeril as needed for your rib pain.   Cal if there is no improvement

## 2016-04-13 NOTE — Progress Notes (Signed)
Pre visit review using our clinic review tool, if applicable. No additional management support is needed unless otherwise documented below in the visit note. 

## 2016-04-14 ENCOUNTER — Encounter: Payer: Self-pay | Admitting: Internal Medicine

## 2016-04-14 LAB — HEPATITIS C ANTIBODY: HCV AB: NEGATIVE

## 2016-04-30 ENCOUNTER — Encounter: Payer: Self-pay | Admitting: Internal Medicine

## 2016-05-01 ENCOUNTER — Other Ambulatory Visit: Payer: Self-pay | Admitting: Internal Medicine

## 2016-05-04 DIAGNOSIS — Z01419 Encounter for gynecological examination (general) (routine) without abnormal findings: Secondary | ICD-10-CM | POA: Diagnosis not present

## 2016-05-04 DIAGNOSIS — Z1231 Encounter for screening mammogram for malignant neoplasm of breast: Secondary | ICD-10-CM | POA: Diagnosis not present

## 2016-05-06 ENCOUNTER — Other Ambulatory Visit: Payer: Self-pay | Admitting: Emergency Medicine

## 2016-05-06 MED ORDER — AMLODIPINE BESYLATE 5 MG PO TABS: 5.0000 mg | ORAL_TABLET | Freq: Every day | ORAL | 3 refills | Status: DC

## 2016-08-05 ENCOUNTER — Other Ambulatory Visit: Payer: Self-pay | Admitting: Internal Medicine

## 2016-08-05 DIAGNOSIS — H35033 Hypertensive retinopathy, bilateral: Secondary | ICD-10-CM | POA: Diagnosis not present

## 2016-08-05 DIAGNOSIS — H35361 Drusen (degenerative) of macula, right eye: Secondary | ICD-10-CM | POA: Diagnosis not present

## 2016-08-05 DIAGNOSIS — H353121 Nonexudative age-related macular degeneration, left eye, early dry stage: Secondary | ICD-10-CM | POA: Diagnosis not present

## 2016-08-05 DIAGNOSIS — H2513 Age-related nuclear cataract, bilateral: Secondary | ICD-10-CM | POA: Diagnosis not present

## 2016-10-02 DIAGNOSIS — M545 Low back pain: Secondary | ICD-10-CM | POA: Diagnosis not present

## 2016-10-02 DIAGNOSIS — M48062 Spinal stenosis, lumbar region with neurogenic claudication: Secondary | ICD-10-CM | POA: Diagnosis not present

## 2016-10-02 DIAGNOSIS — M4726 Other spondylosis with radiculopathy, lumbar region: Secondary | ICD-10-CM | POA: Diagnosis not present

## 2016-12-26 ENCOUNTER — Ambulatory Visit (INDEPENDENT_AMBULATORY_CARE_PROVIDER_SITE_OTHER): Payer: 59

## 2016-12-26 DIAGNOSIS — Z23 Encounter for immunization: Secondary | ICD-10-CM

## 2017-01-06 ENCOUNTER — Other Ambulatory Visit: Payer: Self-pay | Admitting: Internal Medicine

## 2017-04-05 ENCOUNTER — Other Ambulatory Visit: Payer: Self-pay | Admitting: Internal Medicine

## 2017-06-16 DIAGNOSIS — Z719 Counseling, unspecified: Secondary | ICD-10-CM | POA: Diagnosis not present

## 2017-06-18 ENCOUNTER — Other Ambulatory Visit: Payer: Self-pay | Admitting: Internal Medicine

## 2017-08-09 DIAGNOSIS — H35361 Drusen (degenerative) of macula, right eye: Secondary | ICD-10-CM | POA: Diagnosis not present

## 2017-08-09 DIAGNOSIS — H2513 Age-related nuclear cataract, bilateral: Secondary | ICD-10-CM | POA: Diagnosis not present

## 2017-08-09 DIAGNOSIS — H353121 Nonexudative age-related macular degeneration, left eye, early dry stage: Secondary | ICD-10-CM | POA: Diagnosis not present

## 2017-08-23 ENCOUNTER — Encounter: Payer: Self-pay | Admitting: Internal Medicine

## 2017-08-23 ENCOUNTER — Ambulatory Visit: Payer: 59 | Admitting: Internal Medicine

## 2017-08-23 VITALS — BP 130/84 | HR 105 | Temp 98.1°F | Resp 16 | Wt 279.0 lb

## 2017-08-23 DIAGNOSIS — D179 Benign lipomatous neoplasm, unspecified: Secondary | ICD-10-CM | POA: Insufficient documentation

## 2017-08-23 MED ORDER — MELOXICAM 15 MG PO TABS: 15.0000 mg | ORAL_TABLET | Freq: Every day | ORAL | 1 refills | Status: DC

## 2017-08-23 MED ORDER — AMLODIPINE BESYLATE 5 MG PO TABS: 5.0000 mg | ORAL_TABLET | Freq: Every day | ORAL | 3 refills | Status: DC

## 2017-08-23 MED ORDER — OMEPRAZOLE 20 MG PO CPDR: 20.0000 mg | DELAYED_RELEASE_CAPSULE | Freq: Every day | ORAL | 1 refills | Status: DC

## 2017-08-23 NOTE — Patient Instructions (Signed)
Monitor the lump.   If it grows significantly we need to evaluate it further.     Lipoma A lipoma is a noncancerous (benign) tumor that is made up of fat cells. This is a very common type of soft-tissue growth. Lipomas are usually found under the skin (subcutaneous). They may occur in any tissue of the body that contains fat. Common areas for lipomas to appear include the back, shoulders, buttocks, and thighs. Lipomas grow slowly, and they are usually painless. Most lipomas do not cause problems and do not require treatment. What are the causes? The cause of this condition is not known. What increases the risk? This condition is more likely to develop in:  People who are 41-83 years old.  People who have a family history of lipomas.  What are the signs or symptoms? A lipoma usually appears as a small, round bump under the skin. It may feel soft or rubbery, but the firmness can vary. Most lipomas are not painful. However, a lipoma may become painful if it is located in an area where it pushes on nerves. How is this diagnosed? A lipoma can usually be diagnosed with a physical exam. You may also have tests to confirm the diagnosis and to rule out other conditions. Tests may include:  Imaging tests, such as a CT scan or MRI.  Removal of a tissue sample to be looked at under a microscope (biopsy).  How is this treated? Treatment is not needed for small lipomas that are not causing problems. If a lipoma continues to get bigger or it causes problems, removal is often the best option. Lipomas can also be removed to improve appearance. Removal of a lipoma is usually done with a surgery in which the fatty cells and the surrounding capsule are removed. Most often, a medicine that numbs the area (local anesthetic) is used for this procedure. Follow these instructions at home:  Keep all follow-up visits as directed by your health care provider. This is important. Contact a health care provider  if:  Your lipoma becomes larger or hard.  Your lipoma becomes painful, red, or increasingly swollen. These could be signs of infection or a more serious condition. This information is not intended to replace advice given to you by your health care provider. Make sure you discuss any questions you have with your health care provider. Document Released: 02/06/2002 Document Revised: 07/25/2015 Document Reviewed: 02/12/2014 Elsevier Interactive Patient Education  [employer].

## 2017-08-23 NOTE — Progress Notes (Signed)
/    Subjective:    [name], female    DOB: 14-Dec-1955, 62 y.o.   MRN: [id number]  HPI The patient is here for an acute visit.  Lump in abdomen: About 2 months ago she noticed a lump in the right side of her abdomen.  She thinks she has another one just above it.  She thinks when she first noticed has gotten slightly larger.  She denies any pain, but on occasion the one might be sore.  She was concerned about the possibility of cancer.  She also think she has a similar lump in the posterior aspect of her right upper arm.  She has had that there for years.  Has not changed in size.  She has needs refills of her medications.  She will be scheduling a physical for September.  Medications and allergies reviewed with patient and updated if appropriate.  Patient Active Problem List   Diagnosis Date Noted  . Rib pain on left side 04/13/2016  . Acute non-recurrent maxillary sinusitis 04/13/2016  . Rash and nonspecific skin eruption 01/01/2016  . Anxiety 12/31/2015  . Prediabetes 12/31/2015  . Chest pain 12/31/2015  . OSA (obstructive sleep apnea) 03/28/2014  . Lumbar and sacral osteoarthritis 10/20/2012  . Lumbar canal stenosis 10/20/2012  . Morbid obesity ([place])   . Allergic rhinitis   . Osteoarthritis, knee 02/18/2011  . Hypertension   . Migraines   . GERD (gastroesophageal reflux disease)     Current Outpatient Medications on File Prior to Visit  Medication Sig Dispense Refill  . amLODipine (NORVASC) 5 MG tablet Take 1 tablet (5 mg total) by mouth daily. 90 tablet 3  . Clobetasol Prop Emollient Base (CLOBETASOL PROPIONATE E) 0.05 % emollient cream Apply 1 application topically 2 (two) times daily. 30 g 0  . cyclobenzaprine (FLEXERIL) 10 MG tablet Take 1 tablet (10 mg total) by mouth 3 (three) times daily as needed. 30 tablet 0  . gabapentin (NEURONTIN) 300 MG capsule Take 2 capsules (600 mg total) by mouth 3 (three) times daily. 180 capsule 1  . meloxicam (MOBIC)  15 MG tablet TAKE 1 TABLET BY MOUTH  DAILY 90 tablet 0  . omeprazole (PRILOSEC) 20 MG capsule Take 1 capsule (20 mg total) by mouth daily. --- Office visit needed for further refills 90 capsule 0   No current facility-administered medications on file prior to visit.     Past Medical History:  Diagnosis Date  . Allergic rhinitis   . Allergy   . Arthritis     knee s/p TKR 2/13  . Depression   . GERD (gastroesophageal reflux disease)   . Hypertension   . Migraines   . OSA (obstructive sleep apnea)    uses cpap nightly  . Shingles outbreak 12/2013   L arm/torso  . Sleep apnea    cpap  . Spinal stenosis     Past Surgical History:  Procedure Laterality Date  . Knee replacement Left 2012  . KNEE SURGERY     both knees  . TOTAL KNEE ARTHROPLASTY  04/13/2011   Procedure: TOTAL KNEE ARTHROPLASTY;  Surgeon: Gearlean Alf, MD;  Location: [facility];  Service: Orthopedics;  Laterality: Left;    Social History   Socioeconomic History  . Marital status: Single    Spouse name: Not on file  . Number of children: Not on file  . Years of education: Not on file  . Highest education level: Not on file  Occupational  History  . Occupation: retired  Scientific laboratory technician  . Financial resource strain: Not on file  . Food insecurity:    Worry: Not on file    Inability: Not on file  . Transportation needs:    Medical: Not on file    Non-medical: Not on file  Tobacco Use  . Smoking status: Never Smoker  . Smokeless tobacco: Never Used  Substance and Sexual Activity  . Alcohol use: Yes    Alcohol/week: 0.6 oz    Types: 1 Glasses of wine per week    Comment: rare social  . Drug use: No  . Sexual activity: Yes  Lifestyle  . Physical activity:    Days per week: Not on file    Minutes per session: Not on file  . Stress: Not on file  Relationships  . Social connections:    Talks on phone: Not on file    Gets together: Not on file    Attends religious service: Not on file    Active member of  club or organization: Not on file    Attends meetings of clubs or organizations: Not on file    Relationship status: Not on file  Other Topics Concern  . Not on file  Social History Narrative  . Not on file    Family History  Problem Relation Age of Onset  . Arthritis Mother   . Breast cancer Mother   . Emphysema Mother   . Asthma Mother   . Heart disease Mother   . Arthritis Father   . Cancer Father   . Alcohol abuse Other   . Diabetes Other   . Colon cancer Neg Hx     Review of Systems  Constitutional: Negative for chills.  Skin: Negative for color change.       Lumps under skin-nontender       Objective:   Vitals:   08/23/17 1600  BP: 130/84  Pulse: (!) 105  Resp: 16  Temp: 98.1 F (36.7 C)  SpO2: 98%   BP Readings from Last 3 Encounters:  08/23/17 130/84  04/13/16 134/84  12/31/15 124/84   Wt Readings from Last 3 Encounters:  08/23/17 279 lb (126.6 kg)  04/13/16 282 lb (127.9 kg)  12/31/15 276 lb (125.2 kg)   Body mass index is 42.42 kg/m.   Physical Exam  Constitutional: She appears well-developed and well-nourished. No distress.  HENT:  Head: Normocephalic and atraumatic.  Abdominal: Soft. She exhibits mass (Grape sized palpable lump right mid abdomen that is mobile and nontender).  Obese abdomen.  Skin: Skin is warm and dry. She is not diaphoretic. No erythema.           Assessment & Plan:    See Problem List for Assessment and Plan of chronic medical problems.

## 2017-08-23 NOTE — Assessment & Plan Note (Signed)
Palpable lump is consistent with a lipoma She also has 1 in her right posterior arm Discussed that this is benign and not concerning She will monitor for growth and if there is rapid growth we will need to evaluate further We deferred referral for removal and ultrasound at this time

## 2017-10-20 ENCOUNTER — Encounter: Payer: Self-pay | Admitting: Internal Medicine

## 2017-10-20 DIAGNOSIS — R7303 Prediabetes: Secondary | ICD-10-CM

## 2017-10-20 DIAGNOSIS — Z0000 Encounter for general adult medical examination without abnormal findings: Secondary | ICD-10-CM

## 2017-10-20 DIAGNOSIS — I10 Essential (primary) hypertension: Secondary | ICD-10-CM

## 2017-10-21 ENCOUNTER — Other Ambulatory Visit (INDEPENDENT_AMBULATORY_CARE_PROVIDER_SITE_OTHER): Payer: 59

## 2017-10-21 DIAGNOSIS — R7303 Prediabetes: Secondary | ICD-10-CM

## 2017-10-21 DIAGNOSIS — I10 Essential (primary) hypertension: Secondary | ICD-10-CM | POA: Diagnosis not present

## 2017-10-21 DIAGNOSIS — Z0000 Encounter for general adult medical examination without abnormal findings: Secondary | ICD-10-CM

## 2017-10-21 LAB — LDL CHOLESTEROL, DIRECT: LDL DIRECT: 134 mg/dL

## 2017-10-21 LAB — CBC WITH DIFFERENTIAL/PLATELET
BASOS ABS: 0 10*3/uL (ref 0.0–0.1)
Basophils Relative: 0.5 % (ref 0.0–3.0)
Eosinophils Absolute: 0.3 10*3/uL (ref 0.0–0.7)
Eosinophils Relative: 3.5 % (ref 0.0–5.0)
HCT: 44.1 % (ref 36.0–46.0)
Hemoglobin: 14.8 g/dL (ref 12.0–15.0)
LYMPHS ABS: 2.1 10*3/uL (ref 0.7–4.0)
LYMPHS PCT: 24.8 % (ref 12.0–46.0)
MCHC: 33.5 g/dL (ref 30.0–36.0)
MCV: 86.1 fl (ref 78.0–100.0)
MONOS PCT: 5.6 % (ref 3.0–12.0)
Monocytes Absolute: 0.5 10*3/uL (ref 0.1–1.0)
NEUTROS PCT: 65.6 % (ref 43.0–77.0)
Neutro Abs: 5.6 10*3/uL (ref 1.4–7.7)
Platelets: 261 10*3/uL (ref 150.0–400.0)
RBC: 5.12 Mil/uL — AB (ref 3.87–5.11)
RDW: 13.4 % (ref 11.5–15.5)
WBC: 8.6 10*3/uL (ref 4.0–10.5)

## 2017-10-21 LAB — TSH: TSH: 1.43 u[IU]/mL (ref 0.35–4.50)

## 2017-10-21 LAB — COMPREHENSIVE METABOLIC PANEL
ALK PHOS: 97 U/L (ref 39–117)
ALT: 31 U/L (ref 0–35)
AST: 23 U/L (ref 0–37)
Albumin: 4.3 g/dL (ref 3.5–5.2)
BILIRUBIN TOTAL: 0.7 mg/dL (ref 0.2–1.2)
BUN: 20 mg/dL (ref 6–23)
CALCIUM: 9.8 mg/dL (ref 8.4–10.5)
CO2: 27 mEq/L (ref 19–32)
Chloride: 98 mEq/L (ref 96–112)
Creatinine, Ser: 0.73 mg/dL (ref 0.40–1.20)
GFR: 85.88 mL/min (ref >60.00)
GLUCOSE: 355 mg/dL — AB (ref 70–99)
Potassium: 4 mEq/L (ref 3.5–5.1)
Sodium: 135 mEq/L (ref 135–145)
TOTAL PROTEIN: 7.9 g/dL (ref 6.0–8.3)

## 2017-10-21 LAB — LIPID PANEL
CHOLESTEROL: 207 mg/dL — AB (ref 0–200)
HDL: 46.2 mg/dL (ref >39.00)
NonHDL: 160.35
TRIGLYCERIDES: 256 mg/dL — AB (ref 0.0–149.0)
Total CHOL/HDL Ratio: 4
VLDL: 51.2 mg/dL — ABNORMAL HIGH (ref 0.0–40.0)

## 2017-10-21 LAB — HEMOGLOBIN A1C: HEMOGLOBIN A1C: 12.9 % — AB (ref 4.6–6.5)

## 2017-10-24 ENCOUNTER — Encounter: Payer: Self-pay | Admitting: Internal Medicine

## 2017-10-24 NOTE — Patient Instructions (Addendum)
Have blood work done prior to your next visit.   Test(s) ordered today. Your results will be released to [place] (or called to you) after review, usually within 72hours after test completion. If any changes need to be made, you will be notified at that same time.  All other Health Maintenance issues reviewed.   All recommended immunizations and age-appropriate screenings are up-to-date or discussed.  No immunizations administered today.   An EKG was done today.    Medications reviewed and updated.  Changes include  -  Starting metformin twice a day with food ozempic injection once a week   Your prescription(s) have been submitted to your pharmacy. Please take as directed and contact our office if you believe you are having problem(s) with the medication(s).   A referral was ordered for cardiology.  Please followup in 3 months   Health Maintenance, Female Adopting a healthy lifestyle and getting preventive care can go a long way to promote health and wellness. Talk with your health care provider about what schedule of regular examinations is right for you. This is a good chance for you to check in with your provider about disease prevention and staying healthy. In between checkups, there are plenty of things you can do on your own. Experts have done a lot of research about which lifestyle changes and preventive measures are most likely to keep you healthy. Ask your health care provider for more information. Weight and diet Eat a healthy diet  Be sure to include plenty of vegetables, fruits, low-fat dairy products, and lean protein.  Do not eat a lot of foods high in solid fats, added sugars, or salt.  Get regular exercise. This is one of the most important things you can do for your health. ? Most adults should exercise for at least 150 minutes each week. The exercise should increase your heart rate and make you sweat (moderate-intensity exercise). ? Most adults should also do  strengthening exercises at least twice a week. This is in addition to the moderate-intensity exercise.  Maintain a healthy weight  Body mass index (BMI) is a measurement that can be used to identify possible weight problems. It estimates body fat based on height and weight. Your health care provider can help determine your BMI and help you achieve or maintain a healthy weight.  For females 34 years of age and older: ? A BMI below 18.5 is considered underweight. ? A BMI of 18.5 to 24.9 is normal. ? A BMI of 25 to 29.9 is considered overweight. ? A BMI of 30 and above is considered obese.  Watch levels of cholesterol and blood lipids  You should start having your blood tested for lipids and cholesterol at 62 years of age, then have this test every 5 years.  You may need to have your cholesterol levels checked more often if: ? Your lipid or cholesterol levels are high. ? You are older than 62 years of age. ? You are at high risk for heart disease.  Cancer screening Lung Cancer  Lung cancer screening is recommended for adults 70-67 years old who are at high risk for lung cancer because of a history of smoking.  A yearly low-dose CT scan of the lungs is recommended for people who: ? Currently smoke. ? Have quit within the past 15 years. ? Have at least a 30-pack-year history of smoking. A pack year is smoking an average of one pack of cigarettes a day for 1 year.  Yearly screening  should continue until it has been 15 years since you quit.  Yearly screening should stop if you develop a health problem that would prevent you from having lung cancer treatment.  Breast Cancer  Practice breast self-awareness. This means understanding how your breasts normally appear and feel.  It also means doing regular breast self-exams. Let your health care provider know about any changes, no matter how small.  If you are in your 20s or 30s, you should have a clinical breast exam (CBE) by a health  care provider every 1-3 years as part of a regular health exam.  If you are 79 or older, have a CBE every year. Also consider having a breast X-ray (mammogram) every year.  If you have a family history of breast cancer, talk to your health care provider about genetic screening.  If you are at high risk for breast cancer, talk to your health care provider about having an MRI and a mammogram every year.  Breast cancer gene (BRCA) assessment is recommended for women who have family members with BRCA-related cancers. BRCA-related cancers include: ? Breast. ? Ovarian. ? Tubal. ? Peritoneal cancers.  Results of the assessment will determine the need for genetic counseling and BRCA1 and BRCA2 testing.  Cervical Cancer Your health care provider may recommend that you be screened regularly for cancer of the pelvic organs (ovaries, uterus, and vagina). This screening involves a pelvic examination, including checking for microscopic changes to the surface of your cervix (Pap test). You may be encouraged to have this screening done every 3 years, beginning at age 63.  For women ages 44-65, health care providers may recommend pelvic exams and Pap testing every 3 years, or they may recommend the Pap and pelvic exam, combined with testing for human papilloma virus (HPV), every 5 years. Some types of HPV increase your risk of cervical cancer. Testing for HPV may also be done on women of any age with unclear Pap test results.  Other health care providers may not recommend any screening for nonpregnant women who are considered low risk for pelvic cancer and who do not have symptoms. Ask your health care provider if a screening pelvic exam is right for you.  If you have had past treatment for cervical cancer or a condition that could lead to cancer, you need Pap tests and screening for cancer for at least 20 years after your treatment. If Pap tests have been discontinued, your risk factors (such as having a new  sexual partner) need to be reassessed to determine if screening should resume. Some women have medical problems that increase the chance of getting cervical cancer. In these cases, your health care provider may recommend more frequent screening and Pap tests.  Colorectal Cancer  This type of cancer can be detected and often prevented.  Routine colorectal cancer screening usually begins at 62 years of age and continues through 62 years of age.  Your health care provider may recommend screening at an earlier age if you have risk factors for colon cancer.  Your health care provider may also recommend using home test kits to check for hidden blood in the stool.  A small camera at the end of a tube can be used to examine your colon directly (sigmoidoscopy or colonoscopy). This is done to check for the earliest forms of colorectal cancer.  Routine screening usually begins at age 48.  Direct examination of the colon should be repeated every 5-10 years through 62 years of age. However, you  may need to be screened more often if early forms of precancerous polyps or small growths are found.  Skin Cancer  Check your skin from head to toe regularly.  Tell your health care provider about any new moles or changes in moles, especially if there is a change in a mole's shape or color.  Also tell your health care provider if you have a mole that is larger than the size of a pencil eraser.  Always use sunscreen. Apply sunscreen liberally and repeatedly throughout the day.  Protect yourself by wearing long sleeves, pants, a wide-brimmed hat, and sunglasses whenever you are outside.  Heart disease, diabetes, and high blood pressure  High blood pressure causes heart disease and increases the risk of stroke. High blood pressure is more likely to develop in: ? People who have blood pressure in the high end of the normal range (130-139/85-89 mm Hg). ? People who are overweight or obese. ? People who are  African American.  If you are 22-85 years of age, have your blood pressure checked every 3-5 years. If you are 58 years of age or older, have your blood pressure checked every year. You should have your blood pressure measured twice-once when you are at a hospital or clinic, and once when you are not at a hospital or clinic. Record the average of the two measurements. To check your blood pressure when you are not at a hospital or clinic, you can use: ? An automated blood pressure machine at a pharmacy. ? A home blood pressure monitor.  If you are between 73 years and 66 years old, ask your health care provider if you should take aspirin to prevent strokes.  Have regular diabetes screenings. This involves taking a blood sample to check your fasting blood sugar level. ? If you are at a normal weight and have a low risk for diabetes, have this test once every three years after 62 years of age. ? If you are overweight and have a high risk for diabetes, consider being tested at a younger age or more often. Preventing infection Hepatitis B  If you have a higher risk for hepatitis B, you should be screened for this virus. You are considered at high risk for hepatitis B if: ? You were born in a country where hepatitis B is common. Ask your health care provider which countries are considered high risk. ? Your parents were born in a high-risk country, and you have not been immunized against hepatitis B (hepatitis B vaccine). ? You have HIV or AIDS. ? You use needles to inject street drugs. ? You live with someone who has hepatitis B. ? You have had sex with someone who has hepatitis B. ? You get hemodialysis treatment. ? You take certain medicines for conditions, including cancer, organ transplantation, and autoimmune conditions.  Hepatitis C  Blood testing is recommended for: ? Everyone born from [birth year] through 1965. ? Anyone with known risk factors for hepatitis C.  Sexually transmitted  infections (STIs)  You should be screened for sexually transmitted infections (STIs) including gonorrhea and chlamydia if: ? You are sexually active and are younger than 62 years of age. ? You are older than 62 years of age and your health care provider tells you that you are at risk for this type of infection. ? Your sexual activity has changed since you were last screened and you are at an increased risk for chlamydia or gonorrhea. Ask your health care provider if you are  at risk.  If you do not have HIV, but are at risk, it may be recommended that you take a prescription medicine daily to prevent HIV infection. This is called pre-exposure prophylaxis (PrEP). You are considered at risk if: ? You are sexually active and do not regularly use condoms or know the HIV status of your partner(s). ? You take drugs by injection. ? You are sexually active with a partner who has HIV.  Talk with your health care provider about whether you are at high risk of being infected with HIV. If you choose to begin PrEP, you should first be tested for HIV. You should then be tested every 3 months for as long as you are taking PrEP. Pregnancy  If you are premenopausal and you may become pregnant, ask your health care provider about preconception counseling.  If you may become pregnant, take 400 to 800 micrograms (mcg) of folic acid every day.  If you want to prevent pregnancy, talk to your health care provider about birth control (contraception). Osteoporosis and menopause  Osteoporosis is a disease in which the bones lose minerals and strength with aging. This can result in serious bone fractures. Your risk for osteoporosis can be identified using a bone density scan.  If you are 41 years of age or older, or if you are at risk for osteoporosis and fractures, ask your health care provider if you should be screened.  Ask your health care provider whether you should take a calcium or vitamin D supplement to lower  your risk for osteoporosis.  Menopause may have certain physical symptoms and risks.  Hormone replacement therapy may reduce some of these symptoms and risks. Talk to your health care provider about whether hormone replacement therapy is right for you. Follow these instructions at home:  Schedule regular health, dental, and eye exams.  Stay current with your immunizations.  Do not use any tobacco products including cigarettes, chewing tobacco, or electronic cigarettes.  If you are pregnant, do not drink alcohol.  If you are breastfeeding, limit how much and how often you drink alcohol.  Limit alcohol intake to no more than 1 drink per day for nonpregnant women. One drink equals 12 ounces of beer, 5 ounces of wine, or 1 ounces of hard liquor.  Do not use street drugs.  Do not share needles.  Ask your health care provider for help if you need support or information about quitting drugs.  Tell your health care provider if you often feel depressed.  Tell your health care provider if you have ever been abused or do not feel safe at home. This information is not intended to replace advice given to you by your health care provider. Make sure you discuss any questions you have with your health care provider. Document Released: 09/01/2010 Document Revised: 07/25/2015 Document Reviewed: 11/20/2014 Elsevier Interactive Patient Education  [employer].

## 2017-10-24 NOTE — Progress Notes (Signed)
Subjective:    [name], female    DOB: 1956-02-17, 62 y.o.   MRN: [id number]  HPI She is here for a physical exam.   She walked a short distance the end of July and she had chest pain that lasted one hour - it felt like someone was kicking her.  She was tired all day.  She had chest pain later that night that lasted for 30 min.  She has not had any chest pain since then.  She did not think the pain felt like heartburn.    RUQ pain:  She has had some RUQ pain.  She had it a couple of times.  It is sore to touch.  It occurred when she bent over.  She does not have pain now.    Foot pain:  Numbness/tingling/bee sting was initially only at night, but now during the day too.  It is all over the feet and now she has it in her right hand. She has had the foot symptoms for years - she has chronic lower back pain.  The diabetes is new sometime in the past couple of years.    Diabetes:  This is new.  She has not been eating healthy and has not been exercising.    Medications and allergies reviewed with patient and updated if appropriate.  Patient Active Problem List   Diagnosis Date Noted  . 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 Rib pain on left side 04/13/2016  . Anxiety 12/31/2015  . Diabetes ([place]) 12/31/2015  . Chest pain 12/31/2015  . OSA (obstructive sleep apnea) 03/28/2014  . Lumbar and sacral osteoarthritis 10/20/2012  . Lumbar canal stenosis 10/20/2012  . Morbid obesity ([place])   . Allergic rhinitis   . Osteoarthritis, knee 02/18/2011  . Hypertension   . Migraines   . GERD (gastroesophageal reflux disease)     Current Outpatient Medications on File Prior to Visit  Medication Sig Dispense Refill  . amLODipine (NORVASC) 5 MG tablet Take 1 tablet (5 mg total) by mouth daily. 90 tablet 3  . Clobetasol Prop Emollient Base (CLOBETASOL PROPIONATE E) 0.05 % emollient cream Apply 1 application topically 2 (two) times daily. 30 g 0  . cyclobenzaprine (FLEXERIL) 10 MG tablet Take 1  tablet (10 mg total) by mouth 3 (three) times daily as needed. 30 tablet 0  . gabapentin (NEURONTIN) 300 MG capsule Take 2 capsules (600 mg total) by mouth 3 (three) times daily. 180 capsule 1  . meloxicam (MOBIC) 15 MG tablet Take 1 tablet (15 mg total) by mouth daily. 90 tablet 1  . omeprazole (PRILOSEC) 20 MG capsule Take 1 capsule (20 mg total) by mouth daily. 90 capsule 1   No current facility-administered medications on file prior to visit.     Past Medical History:  Diagnosis Date  . Allergic rhinitis   . Allergy   . Arthritis     knee s/p TKR 2/13  . Depression   . GERD (gastroesophageal reflux disease)   . Hypertension   . Migraines   . OSA (obstructive sleep apnea)    uses cpap nightly  . Shingles outbreak 12/2013   L arm/torso  . Sleep apnea    cpap  . Spinal stenosis     Past Surgical History:  Procedure Laterality Date  . Knee replacement Left 2012  . KNEE SURGERY     both knees  . TOTAL KNEE ARTHROPLASTY  04/13/2011   Procedure: TOTAL KNEE ARTHROPLASTY;  Surgeon:  Gearlean Alf, MD;  Location: [facility];  Service: Orthopedics;  Laterality: Left;    Social History   Socioeconomic History  . Marital status: Single    Spouse name: Not on file  . Number of children: Not on file  . Years of education: Not on file  . Highest education level: Not on file  Occupational History  . Occupation: retired  Scientific laboratory technician  . Financial resource strain: Not on file  . Food insecurity:    Worry: Not on file    Inability: Not on file  . Transportation needs:    Medical: Not on file    Non-medical: Not on file  Tobacco Use  . Smoking status: Never Smoker  . Smokeless tobacco: Never Used  Substance and Sexual Activity  . Alcohol use: Yes    Alcohol/week: 1.0 standard drinks    Types: 1 Glasses of wine per week    Comment: rare social  . Drug use: No  . Sexual activity: Yes  Lifestyle  . Physical activity:    Days per week: Not on file    Minutes per session: Not  on file  . Stress: Not on file  Relationships  . Social connections:    Talks on phone: Not on file    Gets together: Not on file    Attends religious service: Not on file    Active member of club or organization: Not on file    Attends meetings of clubs or organizations: Not on file    Relationship status: Not on file  Other Topics Concern  . Not on file  Social History Narrative  . Not on file    Family History  Problem Relation Age of Onset  . Arthritis Mother   . Breast cancer Mother   . Emphysema Mother   . Asthma Mother   . Heart disease Mother   . Arthritis Father   . Cancer Father   . Alcohol abuse Other   . Diabetes Other   . Colon cancer Neg Hx     Review of Systems  Constitutional: Negative for chills and fever.  Respiratory: Positive for cough (chest congestion) and shortness of breath (occasional).   Cardiovascular: Positive for chest pain. Negative for palpitations and leg swelling.  Gastrointestinal: Negative for blood in stool, constipation, diarrhea and nausea.       No gerd  Genitourinary: Negative for dysuria and hematuria.  Musculoskeletal: Positive for back pain. Negative for arthralgias.  Skin: Negative for color change and rash.  Neurological: Positive for light-headedness, numbness and headaches (occ migraine).  Psychiatric/Behavioral: Negative for dysphoric mood. The patient is not nervous/anxious.        Objective:   Vitals:   10/25/17 1322  BP: 138/90  Pulse: (!) 102  Resp: 18  Temp: 98.1 F (36.7 C)  SpO2: 98%   Filed Weights   10/25/17 1322  Weight: 273 lb 1.9 oz (123.9 kg)   Body mass index is 41.53 kg/m.  Wt Readings from Last 3 Encounters:  10/25/17 273 lb 1.9 oz (123.9 kg)  08/23/17 279 lb (126.6 kg)  04/13/16 282 lb (127.9 kg)     Physical Exam Constitutional: She appears well-developed and well-nourished. No distress.  HENT:  Head: Normocephalic and atraumatic.  Right Ear: External ear normal. Normal ear canal  and TM Left Ear: External ear normal.  Normal ear canal and TM Mouth/Throat: Oropharynx is clear and moist.  Eyes: Conjunctivae and EOM are normal.  Neck: Neck supple. No  tracheal deviation present. No thyromegaly present.  No carotid bruit  Cardiovascular: Normal rate, regular rhythm and normal heart sounds.   No murmur heard.  No edema. Pulmonary/Chest: Effort normal and breath sounds normal. No respiratory distress. She has no wheezes. She has no rales.  Breast: deferred to Gyn Abdominal: Soft. She exhibits no distension. There is no tenderness.  Lymphadenopathy: She has no cervical adenopathy.  Skin: Skin is warm and dry. She is not diaphoretic.  Psychiatric: She has a normal mood and affect. Her behavior is normal.   Diabetic Foot Exam - Simple   Simple Foot Form Diabetic Foot exam was performed with the following findings:  Yes 10/25/2017  2:23 PM  Visual Inspection No deformities, no ulcerations, no other skin breakdown bilaterally:  Yes Sensation Testing Intact to touch and monofilament testing bilaterally:  Yes Pulse Check Posterior Tibialis and Dorsalis pulse intact bilaterally:  Yes Comments         Assessment & Plan:   Physical exam: Screening blood work   reviewed Immunizations   Discussed shingrix - get in 3 years, others up to date - flu later this fall; pneumovax today Colonoscopy  Up to date  Mammogram    Done at gyn's office Gyn   Dr Gertie Fey   Eye exams  Done 08/09/17 EKG    Done 12/2015 Exercise    Just started walking - does not walk much Weight    Advised weight loss Skin   No concerns Substance abuse   none  See Problem List for Assessment and Plan of chronic medical problems.   FU in 3 months

## 2017-10-25 ENCOUNTER — Encounter: Payer: Self-pay | Admitting: Internal Medicine

## 2017-10-25 ENCOUNTER — Ambulatory Visit (INDEPENDENT_AMBULATORY_CARE_PROVIDER_SITE_OTHER): Payer: 59 | Admitting: Internal Medicine

## 2017-10-25 VITALS — BP 138/90 | HR 102 | Temp 98.1°F | Resp 18 | Ht 68.0 in | Wt 273.1 lb

## 2017-10-25 DIAGNOSIS — R0789 Other chest pain: Secondary | ICD-10-CM | POA: Diagnosis not present

## 2017-10-25 DIAGNOSIS — F419 Anxiety disorder, unspecified: Secondary | ICD-10-CM

## 2017-10-25 DIAGNOSIS — I10 Essential (primary) hypertension: Secondary | ICD-10-CM | POA: Diagnosis not present

## 2017-10-25 DIAGNOSIS — Z0000 Encounter for general adult medical examination without abnormal findings: Secondary | ICD-10-CM | POA: Diagnosis not present

## 2017-10-25 DIAGNOSIS — M48061 Spinal stenosis, lumbar region without neurogenic claudication: Secondary | ICD-10-CM | POA: Diagnosis not present

## 2017-10-25 DIAGNOSIS — E119 Type 2 diabetes mellitus without complications: Secondary | ICD-10-CM

## 2017-10-25 DIAGNOSIS — K219 Gastro-esophageal reflux disease without esophagitis: Secondary | ICD-10-CM | POA: Diagnosis not present

## 2017-10-25 DIAGNOSIS — Z23 Encounter for immunization: Secondary | ICD-10-CM | POA: Diagnosis not present

## 2017-10-25 DIAGNOSIS — Z794 Long term (current) use of insulin: Secondary | ICD-10-CM

## 2017-10-25 DIAGNOSIS — G629 Polyneuropathy, unspecified: Secondary | ICD-10-CM

## 2017-10-25 DIAGNOSIS — Z114 Encounter for screening for human immunodeficiency virus [HIV]: Secondary | ICD-10-CM

## 2017-10-25 MED ORDER — SEMAGLUTIDE(0.25 OR 0.5MG/DOS) 2 MG/1.5ML ~~LOC~~ SOPN: PEN_INJECTOR | SUBCUTANEOUS | 5 refills | Status: DC

## 2017-10-25 MED ORDER — GABAPENTIN 300 MG PO CAPS: 1200.0000 mg | ORAL_CAPSULE | Freq: Every day | ORAL | 1 refills | Status: DC

## 2017-10-25 MED ORDER — CYCLOBENZAPRINE HCL 10 MG PO TABS: 10.0000 mg | ORAL_TABLET | Freq: Three times a day (TID) | ORAL | 0 refills | Status: DC | PRN

## 2017-10-25 MED ORDER — MELOXICAM 15 MG PO TABS: 15.0000 mg | ORAL_TABLET | Freq: Every day | ORAL | 1 refills | Status: DC

## 2017-10-25 MED ORDER — METFORMIN HCL 500 MG PO TABS: 500.0000 mg | ORAL_TABLET | Freq: Two times a day (BID) | ORAL | 3 refills | Status: DC

## 2017-10-25 MED ORDER — BLOOD GLUCOSE MONITOR KIT: PACK | 0 refills | Status: DC

## 2017-10-25 NOTE — Assessment & Plan Note (Signed)
BP controlled, but borderline high No change in meds today Work on weight loss  Increase exercise

## 2017-10-25 NOTE — Assessment & Plan Note (Addendum)
Chronic back pain Takes flexeril as needed Taking gabapentin 900 mg at bedtime Takes meloxicam 15 mg daily Continue above

## 2017-10-25 NOTE — Assessment & Plan Note (Signed)
Morbidly obese Stressed weight loss She has started to walk-increase exercise Decrease portions and healthy diet

## 2017-10-25 NOTE — Assessment & Plan Note (Signed)
GERD controlled Continue daily medication  

## 2017-10-25 NOTE — Assessment & Plan Note (Signed)
Had 2 episodes of chest pain approximately 1 month ago Concern for possible myocardial ischemia EKG today-normal sinus rhythm at 94 bpm, left atrial enlargement, normal EKG, left atrial enlargement new compared to prior EKGs, otherwise stable She has not had any episodes in the past month Will refer to cardiology for possible stress test

## 2017-10-25 NOTE — Assessment & Plan Note (Signed)
This is a new diagnosis Diabetic education given - she deferred a referral to nutrition Stressed increasing exercise and weight loss Low sugar/carb diet Start metformin and ozempic -- discussed possible side effects - goal is to get off medication F/u in 3 months, sooner if needed

## 2017-10-25 NOTE — Assessment & Plan Note (Signed)
Has had neuropathy in both feet for years  -likely secondary to chronic back: Neuropathies has worsened recently and not having in her right hand Worsening in the right hand symptoms possibly related to new onset uncontrolled diabetes Stressed better sugar control She is taking vitamin B12 daily and will continue-unlikely B12 deficiency We will try increasing gabapentin to 1200 mg at bedtime

## 2017-11-15 ENCOUNTER — Encounter: Payer: Self-pay | Admitting: Internal Medicine

## 2018-01-10 ENCOUNTER — Encounter: Payer: Self-pay | Admitting: Internal Medicine

## 2018-01-11 NOTE — Telephone Encounter (Signed)
Order already in.

## 2018-01-12 ENCOUNTER — Other Ambulatory Visit (INDEPENDENT_AMBULATORY_CARE_PROVIDER_SITE_OTHER): Payer: 59

## 2018-01-12 DIAGNOSIS — Z794 Long term (current) use of insulin: Secondary | ICD-10-CM

## 2018-01-12 DIAGNOSIS — E119 Type 2 diabetes mellitus without complications: Secondary | ICD-10-CM | POA: Diagnosis not present

## 2018-01-12 DIAGNOSIS — I10 Essential (primary) hypertension: Secondary | ICD-10-CM | POA: Diagnosis not present

## 2018-01-12 DIAGNOSIS — Z114 Encounter for screening for human immunodeficiency virus [HIV]: Secondary | ICD-10-CM

## 2018-01-12 LAB — COMPREHENSIVE METABOLIC PANEL
ALT: 14 U/L (ref 0–35)
AST: 13 U/L (ref 0–37)
Albumin: 4.3 g/dL (ref 3.5–5.2)
Alkaline Phosphatase: 89 U/L (ref 39–117)
BUN: 13 mg/dL (ref 6–23)
CO2: 31 meq/L (ref 19–32)
Calcium: 10.3 mg/dL (ref 8.4–10.5)
Chloride: 101 mEq/L (ref 96–112)
Creatinine, Ser: 0.66 mg/dL (ref 0.40–1.20)
GFR: 96.41 mL/min (ref >60.00)
Glucose, Bld: 148 mg/dL — ABNORMAL HIGH (ref 70–99)
POTASSIUM: 4.6 meq/L (ref 3.5–5.1)
Sodium: 140 mEq/L (ref 135–145)
Total Bilirubin: 0.5 mg/dL (ref 0.2–1.2)
Total Protein: 8 g/dL (ref 6.0–8.3)

## 2018-01-12 LAB — HEMOGLOBIN A1C: Hgb A1c MFr Bld: 7.6 % — ABNORMAL HIGH (ref 4.6–6.5)

## 2018-01-12 LAB — MICROALBUMIN / CREATININE URINE RATIO
CREATININE, U: 104.9 mg/dL
MICROALB UR: 0.7 mg/dL (ref 0.0–1.9)
MICROALB/CREAT RATIO: 0.7 mg/g (ref 0.0–30.0)

## 2018-01-13 ENCOUNTER — Encounter: Payer: Self-pay | Admitting: Internal Medicine

## 2018-01-13 LAB — HIV ANTIBODY (ROUTINE TESTING W REFLEX): HIV: NONREACTIVE

## 2018-01-16 NOTE — Patient Instructions (Addendum)
  Flu immunization administered today.    Medications reviewed and updated.  Changes include :   Increase ozempic to 1 mg when you finish what you have at home  Your prescription(s) have been submitted to your pharmacy. Please take as directed and contact our office if you believe you are having problem(s) with the medication(s).   Please followup in 4 months

## 2018-01-16 NOTE — Progress Notes (Signed)
Subjective:    [name], female    DOB: [birth year], 62 y.o.   MRN: [id number]  HPI The patient is here for follow up.  LLQ pain:  She had severe pain last week - it lasted three days and then resolved.  She has not had pain since then.  She denies urinary symptoms.  She thought she may have had a kidney stone.  She has not had a kidney stone in the past.  She is currently asymptomatic.  Diabetes: She is taking her medication daily as prescribed. She is much more compliant with a diabetic diet - she has cut out a lot of sweets and is better with her carbs. She is exercising - walking - 2.5-3 miles at a time. She monitors her sugars and they have been running 138.    Hypertension: She is taking her medication daily. She is compliant with a low sodium diet.  She denies chest pain, palpitations, edema, shortness of breath and regular headaches. She is exercising regularly.  She does not monitor her blood pressure at home.    Neuropathy: She is taking gabapentin 300 mg.  She denies any side effects from medication.  She feels medication works well.  Her neuropathy is likely from previous back surgeries, but she thinks it may have gotten slightly better with her sugars improving.  Obesity:  She is exercising reguarly-walking.  She is taking her diabetic medications.  She is eating less sugar and less overall.  She has lost some weight.  GERD:  She is taking her medication daily as prescribed.  She denies any GERD symptoms and feels her GERD is well controlled.  Her GERD has been better controlled with the weight loss.   Medications and allergies reviewed with patient and updated if appropriate.  Patient Active Problem List   Diagnosis Date Noted  . Neuropathy 10/25/2017  . 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 Rib pain on left side 04/13/2016  . Diabetes ([place]) 12/31/2015  . Chest pain 12/31/2015  . OSA (obstructive sleep apnea) 03/28/2014  . Lumbar and sacral osteoarthritis 10/20/2012    . Lumbar canal stenosis 10/20/2012  . Morbid obesity ([place])   . Allergic rhinitis   . Osteoarthritis, knee 02/18/2011  . Hypertension   . Migraines   . GERD (gastroesophageal reflux disease)     Current Outpatient Medications on File Prior to Visit  Medication Sig Dispense Refill  . amLODipine (NORVASC) 5 MG tablet Take 1 tablet (5 mg total) by mouth daily. 90 tablet 3  . blood glucose meter kit and supplies KIT Dispense based on patient and insurance preference. Use up to four times daily as directed. (FOR E11.9). 1 each 0  . Clobetasol Prop Emollient Base (CLOBETASOL PROPIONATE E) 0.05 % emollient cream Apply 1 application topically 2 (two) times daily. 30 g 0  . cyclobenzaprine (FLEXERIL) 10 MG tablet Take 1 tablet (10 mg total) by mouth 3 (three) times daily as needed. 30 tablet 0  . gabapentin (NEURONTIN) 300 MG capsule Take 4 capsules (1,200 mg total) by mouth at bedtime. 360 capsule 1  . meloxicam (MOBIC) 15 MG tablet Take 1 tablet (15 mg total) by mouth daily. 90 tablet 1  . metFORMIN (GLUCOPHAGE) 500 MG tablet Take 1 tablet (500 mg total) by mouth 2 (two) times daily with a meal. 180 tablet 3  . omeprazole (PRILOSEC) 20 MG capsule Take 1 capsule (20 mg total) by mouth daily. 90 capsule 1  . Semaglutide (OZEMPIC) 0.25  or 0.5 MG/DOSE SOPN Inject 0.25 mg into the skin once a week for 30 days, THEN 0.5 mg once a week. 1 pen 5   No current facility-administered medications on file prior to visit.     Past Medical History:  Diagnosis Date  . Allergic rhinitis   . Allergy   . Arthritis     knee s/p TKR 2/13  . Depression   . GERD (gastroesophageal reflux disease)   . Hypertension   . Migraines   . OSA (obstructive sleep apnea)    uses cpap nightly  . Shingles outbreak 12/2013   L arm/torso  . Sleep apnea    cpap  . Spinal stenosis     Past Surgical History:  Procedure Laterality Date  . Knee replacement Left 2012  . KNEE SURGERY     both knees  . TOTAL KNEE  ARTHROPLASTY  04/13/2011   Procedure: TOTAL KNEE ARTHROPLASTY;  Surgeon: Gearlean Alf, MD;  Location: [facility];  Service: Orthopedics;  Laterality: Left;    Social History   Socioeconomic History  . Marital status: Single    Spouse name: Not on file  . Number of children: Not on file  . Years of education: Not on file  . Highest education level: Not on file  Occupational History  . Occupation: retired  Scientific laboratory technician  . Financial resource strain: Not on file  . Food insecurity:    Worry: Not on file    Inability: Not on file  . Transportation needs:    Medical: Not on file    Non-medical: Not on file  Tobacco Use  . Smoking status: Never Smoker  . Smokeless tobacco: Never Used  Substance and Sexual Activity  . Alcohol use: Yes    Alcohol/week: 1.0 standard drinks    Types: 1 Glasses of wine per week    Comment: rare social  . Drug use: No  . Sexual activity: Yes  Lifestyle  . Physical activity:    Days per week: Not on file    Minutes per session: Not on file  . Stress: Not on file  Relationships  . Social connections:    Talks on phone: Not on file    Gets together: Not on file    Attends religious service: Not on file    Active member of club or organization: Not on file    Attends meetings of clubs or organizations: Not on file    Relationship status: Not on file  Other Topics Concern  . Not on file  Social History Narrative  . Not on file    Family History  Problem Relation Age of Onset  . Arthritis Mother   . Breast cancer Mother   . Emphysema Mother   . Asthma Mother   . Heart disease Mother   . Arthritis Father   . Cancer Father   . Alcohol abuse Other   . Diabetes Other   . Colon cancer Neg Hx     Review of Systems  Constitutional: Negative for chills and fever.  Respiratory: Positive for cough (at night from PND/congestion). Negative for shortness of breath and wheezing.   Cardiovascular: Negative for chest pain, palpitations and leg  swelling.  Gastrointestinal: Negative for nausea.  Neurological: Positive for headaches (occ - from metformin). Negative for light-headedness.       Objective:   Vitals:   01/17/18 1407  BP: 138/78  Pulse: (!) 109  Resp: 16  Temp: 98.3 F (36.8 C)  SpO2: 96%   BP Readings from Last 3 Encounters:  01/17/18 138/78  10/25/17 138/90  08/23/17 130/84   Wt Readings from Last 3 Encounters:  01/17/18 265 lb 12.8 oz (120.6 kg)  10/25/17 273 lb 1.9 oz (123.9 kg)  08/23/17 279 lb (126.6 kg)   Body mass index is 40.41 kg/m.   Physical Exam    Constitutional: Appears well-developed and well-nourished. No distress.  HENT:  Head: Normocephalic and atraumatic.  Neck: Neck supple. No tracheal deviation present. No thyromegaly present.  No cervical lymphadenopathy Cardiovascular: Normal rate, regular rhythm and normal heart sounds.   No murmur heard. No carotid bruit .  No edema Pulmonary/Chest: Effort normal and breath sounds normal. No respiratory distress. No has no wheezes. No rales.  Skin: Skin is warm and dry. Not diaphoretic.  Psychiatric: Normal mood and affect. Behavior is normal.      Assessment & Plan:    See Problem List for Assessment and Plan of chronic medical problems.

## 2018-01-17 ENCOUNTER — Encounter: Payer: Self-pay | Admitting: Internal Medicine

## 2018-01-17 ENCOUNTER — Ambulatory Visit: Payer: 59 | Admitting: Internal Medicine

## 2018-01-17 VITALS — BP 138/78 | HR 109 | Temp 98.3°F | Resp 16 | Ht 68.0 in | Wt 265.8 lb

## 2018-01-17 DIAGNOSIS — Z23 Encounter for immunization: Secondary | ICD-10-CM

## 2018-01-17 DIAGNOSIS — G629 Polyneuropathy, unspecified: Secondary | ICD-10-CM

## 2018-01-17 DIAGNOSIS — E119 Type 2 diabetes mellitus without complications: Secondary | ICD-10-CM | POA: Diagnosis not present

## 2018-01-17 DIAGNOSIS — I10 Essential (primary) hypertension: Secondary | ICD-10-CM | POA: Diagnosis not present

## 2018-01-17 DIAGNOSIS — K219 Gastro-esophageal reflux disease without esophagitis: Secondary | ICD-10-CM | POA: Diagnosis not present

## 2018-01-17 DIAGNOSIS — Z794 Long term (current) use of insulin: Secondary | ICD-10-CM

## 2018-01-17 MED ORDER — SEMAGLUTIDE (1 MG/DOSE) 2 MG/1.5ML ~~LOC~~ SOPN: 1.0000 mg | PEN_INJECTOR | SUBCUTANEOUS | 3 refills | Status: DC

## 2018-01-17 MED ORDER — SEMAGLUTIDE (1 MG/DOSE) 2 MG/1.5ML ~~LOC~~ SOPN: 1.0000 mg | PEN_INJECTOR | SUBCUTANEOUS | 5 refills | Status: DC

## 2018-01-17 MED ORDER — METFORMIN HCL 500 MG PO TABS: 500.0000 mg | ORAL_TABLET | Freq: Every day | ORAL | 3 refills | Status: DC

## 2018-01-17 NOTE — Assessment & Plan Note (Signed)
Controlled Continue daily medication Continue weight loss efforts

## 2018-01-17 NOTE — Assessment & Plan Note (Signed)
Continue gabapentin nightly

## 2018-01-17 NOTE — Assessment & Plan Note (Signed)
Has been exercising regularly and has improved her diet and decreased her portions Has lost weight Continue current diabetic medications and lifestyle changes Follow-up in 4 months

## 2018-01-17 NOTE — Assessment & Plan Note (Signed)
A1c much better controlled Continue regular exercise, diabetic diet and weight loss efforts Has decreased metformin to only once daily-did not tolerate it twice daily-continue once daily Increase Ozempic to 1 mg weekly once she finishes what she has at home-hopefully this will lower her sugars further and further help with weight loss Follow-up in 4 months, sooner if needed

## 2018-01-17 NOTE — Assessment & Plan Note (Signed)
BP well controlled Current regimen effective and well tolerated Continue current medications at current doses CMP reviewed

## 2018-02-07 ENCOUNTER — Encounter: Payer: Self-pay | Admitting: Internal Medicine

## 2018-02-07 ENCOUNTER — Ambulatory Visit: Payer: 59 | Admitting: Internal Medicine

## 2018-02-07 VITALS — BP 132/94 | HR 91 | Ht 68.0 in | Wt 269.0 lb

## 2018-02-07 DIAGNOSIS — R079 Chest pain, unspecified: Secondary | ICD-10-CM | POA: Diagnosis not present

## 2018-02-07 DIAGNOSIS — E782 Mixed hyperlipidemia: Secondary | ICD-10-CM

## 2018-02-07 DIAGNOSIS — I10 Essential (primary) hypertension: Secondary | ICD-10-CM

## 2018-02-07 MED ORDER — ROSUVASTATIN CALCIUM 5 MG PO TABS: 5.0000 mg | ORAL_TABLET | Freq: Every day | ORAL | 3 refills | Status: DC

## 2018-02-07 NOTE — Progress Notes (Addendum)
Cardiology Office Note   Date:  02/07/2018   ID:  Michelle Sanchez, DOB Mar 20, 1955, MRN [id number]  PCP:  Binnie Rail, MD  Cardiologist:   Dorris Carnes, MD    Pt referred by [place]   History of Present Illness: Michelle Sanchez is a 62 y.o. 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 amLODipine (NORVASC) 5 MG tablet Take 1 tablet (5 mg total) by mouth daily.  . blood glucose meter kit and supplies KIT Dispense based on patient and insurance preference. Use up to four times daily as directed. (FOR E11.9).  . Clobetasol Prop Emollient Base (CLOBETASOL PROPIONATE E) 0.05 % emollient cream Apply 1 application topically 2 (two) times daily.  . cyclobenzaprine (FLEXERIL) 10 MG tablet Take 1 tablet (10 mg total) by mouth 3 (three) times daily as needed.  . gabapentin (NEURONTIN) 300 MG capsule Take 4 capsules (1,200 mg total) by mouth at bedtime.  . meloxicam (MOBIC) 15 MG tablet Take 1 tablet (15 mg total) by mouth daily.  . metFORMIN (GLUCOPHAGE) 500 MG tablet Take 1 tablet (500 mg total) by mouth daily with breakfast.  . omeprazole (PRILOSEC) 20 MG capsule Take 1 capsule (20 mg total) by mouth daily.  . Semaglutide, 1 MG/DOSE, (OZEMPIC, 1 MG/DOSE,) 2 MG/1.5ML SOPN Inject 1 mg into the skin once a week.     Allergies:   Diclofenac; Prednisone; Diclofenac sodium; and Septra [bactrim]   Past Medical History:  Diagnosis Date  . Allergic rhinitis   . Allergy   . Arthritis     knee s/p TKR 2/13  . Depression   . GERD (gastroesophageal reflux disease)   . Hypertension   . Migraines   . OSA  (obstructive sleep apnea)    uses cpap nightly  . Shingles outbreak 12/2013   L arm/torso  . Sleep apnea    cpap  . Spinal stenosis     Past Surgical History:  Procedure Laterality Date  . Knee replacement Left 2012  . KNEE SURGERY     both knees  . TOTAL KNEE ARTHROPLASTY  04/13/2011   Procedure: TOTAL KNEE ARTHROPLASTY;  Surgeon: Gearlean Alf, MD;  Location: [facility];  Service: Orthopedics;  Laterality: Left;     Social History:  The patient  reports that she has never smoked. She has never used smokeless tobacco. She reports that she drinks about 1.0 standard drinks of alcohol per week. She reports that she does not use drugs.   Family History:  The patient's family history includes Alcohol abuse in her other; Arthritis in her father and mother; Asthma in her mother; Breast cancer in her mother; Cancer in her father; Diabetes in her other; Emphysema in her mother; Heart disease in her mother.    ROS:  Please see the history of present illness. All other systems are reviewed and  Negative to the above problem except as noted.  PHYSICAL EXAM: VS:  BP (!) 132/94   Pulse 91   Ht '5\' 8"'$  (1.727 m)   Wt 269 lb (122 kg)   BMI 40.90 kg/m   GEN: M, in no acute distress  HEENT: normal  Neck: no JVD, carotid bruits, or masses Cardiac: RRR; no murmurs, rubs, or gallops,no edema  Respiratory:  clear to auscultation bilaterally, normal work of breathing GI: soft, nontender, nondistended, + BS  No hepatomegaly  MS: no deformity Moving all extremities   Skin: warm and dry, no rash Neuro:  Strength and sensation are intact Psych: euthymic mood, full affect   EKG:  EKG is ordered today.  SR 91 bpm     Lipid Panel    Component Value Date/Time   CHOL 207 (H) 10/21/2017 0918   TRIG 256.0 (H) 10/21/2017 0918   HDL 46.20 10/21/2017 0918   CHOLHDL 4 10/21/2017 0918   VLDL 51.2 (H) 10/21/2017 0918   LDLCALC 106 (H) 12/26/2015 0938   LDLDIRECT 134.0 10/21/2017 0918      Wt  Readings from Last 3 Encounters:  02/07/18 269 lb (122 kg)  01/17/18 265 lb 12.8 oz (120.6 kg)  10/25/17 273 lb 1.9 oz (123.9 kg)      ASSESSMENT AND PLAN:  1 CP   Atypical   I do not think cardiac   Follow  2  HL  Start cCcrestor 5 mg   F/U in 2 months   3   HTN  Work on [employer] loss  Keep on same meds   F/U in spring  4  DM   A1C much imporved  5  Weiight  Keto diet short erm then swithc   INcrease    Current medicines are reviewed at length with the patient today.  The patient does not have concerns regarding medicines.  Signed, Dorris Carnes, MD  02/07/2018 9:21 AM    [address], [place], [place]  [address] Phone: [phone]; Fax: [phone]

## 2018-02-07 NOTE — Patient Instructions (Signed)
Medication Instructions:  Your physician has recommended you make the following change in your medication:  1.) start rosuvastatin (Crestor) 5 mg once a day   If you need a refill on your cardiac medications before your next appointment, please call your pharmacy.   Lab work: Lipids (fasting) in about 2 months  If you have labs (blood work) drawn today and your tests are completely normal, you will receive your results only by: Marland Kitchen MyChart Message (if you have MyChart) OR . A paper copy in the mail If you have any lab test that is abnormal or we need to change your treatment, we will call you to review the results.  Testing/Procedures: none  Follow-Up: At [facility], you and your health needs are our priority.  As part of our continuing mission to provide you with exceptional heart care, we have created designated Provider Care Teams.  These Care Teams include your primary Cardiologist (physician) and Advanced Practice Providers (APPs -  Physician Assistants and Nurse Practitioners) who all work together to provide you with the care you need, when you need it. You will need a follow up appointment in:  About 4-5 months.  Please call our office 2 months in advance to schedule this appointment.  You may see Dr. Harrington Challenger or one of the following Advanced Practice Providers on your designated Care Team: Richardson Dopp, PA-C [place], [place] . Daune Perch, NP  Any Other Special Instructions Will Be Listed Below (If Applicable).

## 2018-03-14 ENCOUNTER — Other Ambulatory Visit: Payer: Self-pay | Admitting: Internal Medicine

## 2018-04-28 ENCOUNTER — Encounter: Payer: Self-pay | Admitting: Internal Medicine

## 2018-04-28 MED ORDER — OMEPRAZOLE 20 MG PO CPDR: 20.0000 mg | DELAYED_RELEASE_CAPSULE | Freq: Every day | ORAL | 1 refills | Status: DC

## 2018-04-28 MED ORDER — AMLODIPINE BESYLATE 5 MG PO TABS: 5.0000 mg | ORAL_TABLET | Freq: Every day | ORAL | 1 refills | Status: DC

## 2018-04-28 MED ORDER — MELOXICAM 15 MG PO TABS: 15.0000 mg | ORAL_TABLET | Freq: Every day | ORAL | 1 refills | Status: DC

## 2018-05-12 ENCOUNTER — Other Ambulatory Visit: Payer: Self-pay

## 2018-05-12 ENCOUNTER — Encounter: Payer: Self-pay | Admitting: Internal Medicine

## 2018-05-12 ENCOUNTER — Other Ambulatory Visit (INDEPENDENT_AMBULATORY_CARE_PROVIDER_SITE_OTHER): Payer: 59

## 2018-05-12 ENCOUNTER — Other Ambulatory Visit: Payer: 59

## 2018-05-12 DIAGNOSIS — Z794 Long term (current) use of insulin: Principal | ICD-10-CM

## 2018-05-12 DIAGNOSIS — E119 Type 2 diabetes mellitus without complications: Secondary | ICD-10-CM

## 2018-05-12 LAB — COMPREHENSIVE METABOLIC PANEL
ALT: 13 U/L (ref 0–35)
AST: 16 U/L (ref 0–37)
Albumin: 4.4 g/dL (ref 3.5–5.2)
Alkaline Phosphatase: 85 U/L (ref 39–117)
BUN: 13 mg/dL (ref 6–23)
CO2: 27 mEq/L (ref 19–32)
Calcium: 9.7 mg/dL (ref 8.4–10.5)
Chloride: 101 mEq/L (ref 96–112)
Creatinine, Ser: 0.69 mg/dL (ref 0.40–1.20)
GFR: 86.08 mL/min (ref >60.00)
Glucose, Bld: 142 mg/dL — ABNORMAL HIGH (ref 70–99)
POTASSIUM: 3.8 meq/L (ref 3.5–5.1)
Sodium: 138 mEq/L (ref 135–145)
Total Bilirubin: 0.6 mg/dL (ref 0.2–1.2)
Total Protein: 7.7 g/dL (ref 6.0–8.3)

## 2018-05-12 LAB — CBC
HCT: 42.4 % (ref 36.0–46.0)
HEMOGLOBIN: 14.4 g/dL (ref 12.0–15.0)
MCHC: 33.9 g/dL (ref 30.0–36.0)
MCV: 86.2 fl (ref 78.0–100.0)
Platelets: 288 10*3/uL (ref 150.0–400.0)
RBC: 4.92 Mil/uL (ref 3.87–5.11)
RDW: 14.2 % (ref 11.5–15.5)
WBC: 8.1 10*3/uL (ref 4.0–10.5)

## 2018-05-12 LAB — HEMOGLOBIN A1C: Hgb A1c MFr Bld: 6.7 % — ABNORMAL HIGH (ref 4.6–6.5)

## 2018-05-17 NOTE — Progress Notes (Signed)
Subjective:    [name], female    DOB: 01-Aug-1955, 63 y.o.   MRN: [id number]  HPI The patient is here for follow up.  She is doing a fasting diet.  She has not been exercising regularly recently, but plans on getting back to walking regularly.    Diabetes: She is taking her medication daily as prescribed. She is mostly compliant with a diabetic diet. She is exercising irregularly.  She checks her feet daily and denies foot lesions. She is up-to-date with an ophthalmology examination.   Hypertension: She is taking her medication daily. She is compliant with a low sodium diet.  She denies chest pain, palpitations, edema, shortness of breath and regular headaches. She is not exercising regularly.  She does not monitor her blood pressure at home.    Obesity: She was walking regularly, but recently she has not been doing that.  She was sick for short time.  And the weather was not good.  She plans on getting back to regular walking.  She has been eating much better, but still struggles with eating too many sugars at times.  GERD:  She is taking her medication daily as prescribed.  She denies any GERD symptoms and feels her GERD is well controlled.   Neuropathy:  She takes gabaepntin.  She denies side effects from gabapentin.    Pain in posterior hamstrings.   Muscles feel tight.  She states it is better when she walks regularly.  She has had some mild lower back issues, which again is better when walking.   Medications and allergies reviewed with patient and updated if appropriate.  Patient Active Problem List   Diagnosis Date Noted  . Neuropathy 10/25/2017  . 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 Rib pain on left side 04/13/2016  . Diabetes ([place]) 12/31/2015  . Chest pain 12/31/2015  . OSA (obstructive sleep apnea) 03/28/2014  . Lumbar and sacral osteoarthritis 10/20/2012  . Lumbar canal stenosis 10/20/2012  . Morbid obesity ([place])   . Allergic rhinitis   . Osteoarthritis, knee  02/18/2011  . Hypertension   . Migraines   . GERD (gastroesophageal reflux disease)     Current Outpatient Medications on File Prior to Visit  Medication Sig Dispense Refill  . amLODipine (NORVASC) 5 MG tablet Take 1 tablet (5 mg total) by mouth daily. 90 tablet 1  . blood glucose meter kit and supplies KIT Dispense based on patient and insurance preference. Use up to four times daily as directed. (FOR E11.9). 1 each 0  . Clobetasol Prop Emollient Base (CLOBETASOL PROPIONATE E) 0.05 % emollient cream Apply 1 application topically 2 (two) times daily. 30 g 0  . cyclobenzaprine (FLEXERIL) 10 MG tablet Take 1 tablet (10 mg total) by mouth 3 (three) times daily as needed. 30 tablet 0  . gabapentin (NEURONTIN) 300 MG capsule TAKE 4 CAPSULES BY MOUTH AT BEDTIME. 360 capsule 1  . meloxicam (MOBIC) 15 MG tablet Take 1 tablet (15 mg total) by mouth daily. 90 tablet 1  . metFORMIN (GLUCOPHAGE) 500 MG tablet Take 1 tablet (500 mg total) by mouth daily with breakfast. 90 tablet 3  . omeprazole (PRILOSEC) 20 MG capsule Take 1 capsule (20 mg total) by mouth daily. 90 capsule 1  . rosuvastatin (CRESTOR) 5 MG tablet Take 1 tablet (5 mg total) by mouth daily. 90 tablet 3  . Semaglutide, 1 MG/DOSE, (OZEMPIC, 1 MG/DOSE,) 2 MG/1.5ML SOPN Inject 1 mg into the skin once a week. 3  pen 3   No current facility-administered medications on file prior to visit.     Past Medical History:  Diagnosis Date  . Allergic rhinitis   . Allergy   . Arthritis     knee s/p TKR 2/13  . Depression   . GERD (gastroesophageal reflux disease)   . Hypertension   . Migraines   . OSA (obstructive sleep apnea)    uses cpap nightly  . Shingles outbreak 12/2013   L arm/torso  . Sleep apnea    cpap  . Spinal stenosis     Past Surgical History:  Procedure Laterality Date  . Knee replacement Left 2012  . KNEE SURGERY     both knees  . TOTAL KNEE ARTHROPLASTY  04/13/2011   Procedure: TOTAL KNEE ARTHROPLASTY;  Surgeon: Gearlean Alf, MD;  Location: [facility];  Service: Orthopedics;  Laterality: Left;    Social History   Socioeconomic History  . Marital status: Single    Spouse name: Not on file  . Number of children: Not on file  . Years of education: Not on file  . Highest education level: Not on file  Occupational History  . Occupation: retired  Scientific laboratory technician  . Financial resource strain: Not on file  . Food insecurity:    Worry: Not on file    Inability: Not on file  . Transportation needs:    Medical: Not on file    Non-medical: Not on file  Tobacco Use  . Smoking status: Never Smoker  . Smokeless tobacco: Never Used  Substance and Sexual Activity  . Alcohol use: Yes    Alcohol/week: 1.0 standard drinks    Types: 1 Glasses of wine per week    Comment: rare social  . Drug use: No  . Sexual activity: Yes  Lifestyle  . Physical activity:    Days per week: Not on file    Minutes per session: Not on file  . Stress: Not on file  Relationships  . Social connections:    Talks on phone: Not on file    Gets together: Not on file    Attends religious service: Not on file    Active member of club or organization: Not on file    Attends meetings of clubs or organizations: Not on file    Relationship status: Not on file  Other Topics Concern  . Not on file  Social History Narrative  . Not on file    Family History  Problem Relation Age of Onset  . Arthritis Mother   . Breast cancer Mother   . Emphysema Mother   . Asthma Mother   . Heart disease Mother   . Arthritis Father   . Cancer Father   . Alcohol abuse Other   . Diabetes Other   . Colon cancer Neg Hx     Review of Systems  Constitutional: Negative for chills and fever.  Respiratory: Positive for cough (allergy related). Negative for shortness of breath and wheezing.   Cardiovascular: Negative for chest pain, palpitations and leg swelling.  Musculoskeletal: Negative for myalgias.  Neurological: Positive for headaches  (allergies). Negative for light-headedness.       Objective:   Vitals:   05/18/18 1420  BP: 124/80  Pulse: 98  Resp: 16  Temp: 98.6 F (37 C)  SpO2: 96%   BP Readings from Last 3 Encounters:  05/18/18 124/80  02/07/18 (!) 132/94  01/17/18 138/78   Wt Readings from Last 3 Encounters:  05/18/18 263 lb (119.3 kg)  02/07/18 269 lb (122 kg)  01/17/18 265 lb 12.8 oz (120.6 kg)   Body mass index is 39.99 kg/m.   Physical Exam    Constitutional: Appears well-developed and well-nourished. No distress.  HENT:  Head: Normocephalic and atraumatic.  Neck: Neck supple. No tracheal deviation present. No thyromegaly present.  No cervical lymphadenopathy Cardiovascular: Normal rate, regular rhythm and normal heart sounds.   No murmur heard. No carotid bruit .  No edema Pulmonary/Chest: Effort normal and breath sounds normal. No respiratory distress. No has no wheezes. No rales.  Skin: Skin is warm and dry. Not diaphoretic.  Psychiatric: Normal mood and affect. Behavior is normal.      Assessment & Plan:    See Problem List for Assessment and Plan of chronic medical problems.

## 2018-05-17 NOTE — Patient Instructions (Addendum)
  Medications reviewed and updated.  Changes include :  Stop metformin when you increase the ozempic    Please followup in 6 months

## 2018-05-18 ENCOUNTER — Other Ambulatory Visit: Payer: Self-pay | Admitting: Internal Medicine

## 2018-05-18 ENCOUNTER — Other Ambulatory Visit: Payer: Self-pay

## 2018-05-18 ENCOUNTER — Ambulatory Visit: Payer: 59 | Admitting: Internal Medicine

## 2018-05-18 ENCOUNTER — Encounter: Payer: Self-pay | Admitting: Internal Medicine

## 2018-05-18 VITALS — BP 124/80 | HR 98 | Temp 98.6°F | Resp 16 | Ht 68.0 in | Wt 263.0 lb

## 2018-05-18 DIAGNOSIS — E119 Type 2 diabetes mellitus without complications: Secondary | ICD-10-CM

## 2018-05-18 DIAGNOSIS — G629 Polyneuropathy, unspecified: Secondary | ICD-10-CM

## 2018-05-18 DIAGNOSIS — K219 Gastro-esophageal reflux disease without esophagitis: Secondary | ICD-10-CM | POA: Diagnosis not present

## 2018-05-18 DIAGNOSIS — I10 Essential (primary) hypertension: Secondary | ICD-10-CM

## 2018-05-18 DIAGNOSIS — Z794 Long term (current) use of insulin: Secondary | ICD-10-CM

## 2018-05-18 MED ORDER — CYCLOBENZAPRINE HCL 10 MG PO TABS: 10.0000 mg | ORAL_TABLET | Freq: Three times a day (TID) | ORAL | 5 refills | Status: DC | PRN

## 2018-05-18 NOTE — Assessment & Plan Note (Signed)
Lab Results  Component Value Date   HGBA1C 6.7 (H) 05/12/2018   A1c much improved Stressed the importance of regular walking-she will start walking again She has improved her diet, but still eats too many sugars at times-she is working on that [employer] loss She will be increasing the Ozempic to 1 mg next week Metformin is causing loose stools so she will discontinue this after running out of it Follow-up in 6 months

## 2018-05-18 NOTE — Assessment & Plan Note (Signed)
Likely related to back surgery in the past Continue gabapentin

## 2018-05-18 NOTE — Assessment & Plan Note (Signed)
BP Readings from Last 3 Encounters:  05/18/18 124/80  02/07/18 (!) 132/94  01/17/18 138/78    BP well controlled Current regimen effective and well tolerated Continue current medications at current doses

## 2018-05-18 NOTE — Assessment & Plan Note (Signed)
GERD controlled Continue daily medication  

## 2018-05-18 NOTE — Assessment & Plan Note (Signed)
With diabetes, hypertension Stressed regular walking Discussed healthy diet, decrease portions She is actively working on weight loss Follow-up in 6 months

## 2018-05-26 ENCOUNTER — Other Ambulatory Visit: Payer: Self-pay

## 2018-05-26 MED ORDER — SEMAGLUTIDE (1 MG/DOSE) 2 MG/1.5ML ~~LOC~~ SOPN: 1.0000 mg | PEN_INJECTOR | SUBCUTANEOUS | 3 refills | Status: DC

## 2018-09-01 ENCOUNTER — Other Ambulatory Visit: Payer: Self-pay | Admitting: Internal Medicine

## 2018-09-15 ENCOUNTER — Other Ambulatory Visit: Payer: Self-pay | Admitting: Internal Medicine

## 2018-10-26 ENCOUNTER — Encounter: Payer: Self-pay | Admitting: Internal Medicine

## 2018-10-28 ENCOUNTER — Encounter: Payer: 59 | Admitting: Internal Medicine

## 2018-11-10 ENCOUNTER — Encounter: Payer: Self-pay | Admitting: Internal Medicine

## 2018-11-10 MED ORDER — MELOXICAM 15 MG PO TABS: 15.0000 mg | ORAL_TABLET | Freq: Every day | ORAL | 1 refills | Status: DC

## 2018-11-17 ENCOUNTER — Other Ambulatory Visit: Payer: Self-pay | Admitting: Internal Medicine

## 2018-11-17 ENCOUNTER — Encounter: Payer: Self-pay | Admitting: Internal Medicine

## 2018-11-18 ENCOUNTER — Other Ambulatory Visit: Payer: Self-pay

## 2018-11-18 MED ORDER — MELOXICAM 15 MG PO TABS: 15.0000 mg | ORAL_TABLET | Freq: Every day | ORAL | 1 refills | Status: DC

## 2018-12-09 ENCOUNTER — Other Ambulatory Visit: Payer: Self-pay | Admitting: Internal Medicine

## 2018-12-21 ENCOUNTER — Other Ambulatory Visit: Payer: Self-pay | Admitting: Neurological Surgery

## 2018-12-21 DIAGNOSIS — M48061 Spinal stenosis, lumbar region without neurogenic claudication: Secondary | ICD-10-CM

## 2018-12-24 ENCOUNTER — Other Ambulatory Visit: Payer: Self-pay

## 2018-12-24 ENCOUNTER — Ambulatory Visit
Admission: RE | Admit: 2018-12-24 | Discharge: 2018-12-24 | Disposition: A | Discharge status: Home / Self Care | Payer: 59 | Source: Ambulatory Visit | Attending: Neurological Surgery | Admitting: Neurological Surgery

## 2018-12-24 DIAGNOSIS — M48061 Spinal stenosis, lumbar region without neurogenic claudication: Secondary | ICD-10-CM

## 2018-12-26 ENCOUNTER — Other Ambulatory Visit: Payer: 59

## 2018-12-30 ENCOUNTER — Encounter: Payer: Self-pay | Admitting: Internal Medicine

## 2018-12-30 DIAGNOSIS — I10 Essential (primary) hypertension: Secondary | ICD-10-CM

## 2018-12-30 DIAGNOSIS — Z794 Long term (current) use of insulin: Secondary | ICD-10-CM

## 2018-12-30 DIAGNOSIS — E119 Type 2 diabetes mellitus without complications: Secondary | ICD-10-CM

## 2019-01-02 ENCOUNTER — Other Ambulatory Visit (INDEPENDENT_AMBULATORY_CARE_PROVIDER_SITE_OTHER): Payer: 59

## 2019-01-02 DIAGNOSIS — E119 Type 2 diabetes mellitus without complications: Secondary | ICD-10-CM

## 2019-01-02 DIAGNOSIS — Z794 Long term (current) use of insulin: Secondary | ICD-10-CM

## 2019-01-02 DIAGNOSIS — I10 Essential (primary) hypertension: Secondary | ICD-10-CM | POA: Diagnosis not present

## 2019-01-02 LAB — MICROALBUMIN / CREATININE URINE RATIO
Creatinine,U: 131.3 mg/dL
Microalb Creat Ratio: 2.4 mg/g (ref 0.0–30.0)
Microalb, Ur: 3.2 mg/dL — ABNORMAL HIGH (ref 0.0–1.9)

## 2019-01-02 LAB — COMPREHENSIVE METABOLIC PANEL
ALT: 21 U/L (ref 0–35)
AST: 13 U/L (ref 0–37)
Albumin: 4.1 g/dL (ref 3.5–5.2)
Alkaline Phosphatase: 91 U/L (ref 39–117)
BUN: 16 mg/dL (ref 6–23)
CO2: 26 mEq/L (ref 19–32)
Calcium: 9.3 mg/dL (ref 8.4–10.5)
Chloride: 100 mEq/L (ref 96–112)
Creatinine, Ser: 0.65 mg/dL (ref 0.40–1.20)
GFR: 92.03 mL/min (ref >60.00)
Glucose, Bld: 298 mg/dL — ABNORMAL HIGH (ref 70–99)
Potassium: 3.6 mEq/L (ref 3.5–5.1)
Sodium: 137 mEq/L (ref 135–145)
Total Bilirubin: 0.6 mg/dL (ref 0.2–1.2)
Total Protein: 7.2 g/dL (ref 6.0–8.3)

## 2019-01-02 LAB — LIPID PANEL
Cholesterol: 164 mg/dL (ref 0–200)
HDL: 55.5 mg/dL (ref >39.00)
LDL Cholesterol: 89 mg/dL (ref 0–99)
NonHDL: 108.29
Total CHOL/HDL Ratio: 3
Triglycerides: 96 mg/dL (ref 0.0–149.0)
VLDL: 19.2 mg/dL (ref 0.0–40.0)

## 2019-01-02 LAB — HEMOGLOBIN A1C: Hgb A1c MFr Bld: 12.6 % — ABNORMAL HIGH (ref 4.6–6.5)

## 2019-01-03 NOTE — Patient Instructions (Signed)
Tests ordered today. Your results will be released to [facility] (or called to you) after review.  If any changes need to be made, you will be notified at that same time.  All other Health Maintenance issues reviewed.   All recommended immunizations and age-appropriate screenings are up-to-date or discussed.  No immunization administered today.   Medications reviewed and updated.  Changes include :     Your prescription(s) have been submitted to your pharmacy. Please take as directed and contact our office if you believe you are having problem(s) with the medication(s).  A referral was ordered for   Please followup in 3 months    Health Maintenance, Female Adopting a healthy lifestyle and getting preventive care are important in promoting health and wellness. Ask your health care provider about:  The right schedule for you to have regular tests and exams.  Things you can do on your own to prevent diseases and keep yourself healthy. What should I know about diet, weight, and exercise? Eat a healthy diet   Eat a diet that includes plenty of vegetables, fruits, low-fat dairy products, and lean protein.  Do not eat a lot of foods that are high in solid fats, added sugars, or sodium. Maintain a healthy weight Body mass index (BMI) is used to identify weight problems. It estimates body fat based on height and weight. Your health care provider can help determine your BMI and help you achieve or maintain a healthy weight. Get regular exercise Get regular exercise. This is one of the most important things you can do for your health. Most adults should:  Exercise for at least 150 minutes each week. The exercise should increase your heart rate and make you sweat (moderate-intensity exercise).  Do strengthening exercises at least twice a week. This is in addition to the moderate-intensity exercise.  Spend less time sitting. Even light physical activity can be beneficial. Watch cholesterol  and blood lipids Have your blood tested for lipids and cholesterol at 63 years of age, then have this test every 5 years. Have your cholesterol levels checked more often if:  Your lipid or cholesterol levels are high.  You are older than 63 years of age.  You are at high risk for heart disease. What should I know about cancer screening? Depending on your health history and family history, you may need to have cancer screening at various ages. This may include screening for:  Breast cancer.  Cervical cancer.  Colorectal cancer.  Skin cancer.  Lung cancer. What should I know about heart disease, diabetes, and high blood pressure? Blood pressure and heart disease  High blood pressure causes heart disease and increases the risk of stroke. This is more likely to develop in people who have high blood pressure readings, are of African descent, or are overweight.  Have your blood pressure checked: ? Every 3-5 years if you are 84-75 years of age. ? Every year if you are 59 years old or older. Diabetes Have regular diabetes screenings. This checks your fasting blood sugar level. Have the screening done:  Once every three years after age 36 if you are at a normal weight and have a low risk for diabetes.  More often and at a younger age if you are overweight or have a high risk for diabetes. What should I know about preventing infection? Hepatitis B If you have a higher risk for hepatitis B, you should be screened for this virus. Talk with your health care provider to find  out if you are at risk for hepatitis B infection. Hepatitis C Testing is recommended for:  Everyone born from [birth year] through 1965.  Anyone with known risk factors for hepatitis C. Sexually transmitted infections (STIs)  Get screened for STIs, including gonorrhea and chlamydia, if: ? You are sexually active and are younger than 63 years of age. ? You are older than 63 years of age and your health care provider  tells you that you are at risk for this type of infection. ? Your sexual activity has changed since you were last screened, and you are at increased risk for chlamydia or gonorrhea. Ask your health care provider if you are at risk.  Ask your health care provider about whether you are at high risk for HIV. Your health care provider may recommend a prescription medicine to help prevent HIV infection. If you choose to take medicine to prevent HIV, you should first get tested for HIV. You should then be tested every 3 months for as long as you are taking the medicine. Pregnancy  If you are about to stop having your period (premenopausal) and you may become pregnant, seek counseling before you get pregnant.  Take 400 to 800 micrograms (mcg) of folic acid every day if you become pregnant.  Ask for birth control (contraception) if you want to prevent pregnancy. Osteoporosis and menopause Osteoporosis is a disease in which the bones lose minerals and strength with aging. This can result in bone fractures. If you are 68 years old or older, or if you are at risk for osteoporosis and fractures, ask your health care provider if you should:  Be screened for bone loss.  Take a calcium or vitamin D supplement to lower your risk of fractures.  Be given hormone replacement therapy (HRT) to treat symptoms of menopause. Follow these instructions at home: Lifestyle  Do not use any products that contain nicotine or tobacco, such as cigarettes, e-cigarettes, and chewing tobacco. If you need help quitting, ask your health care provider.  Do not use street drugs.  Do not share needles.  Ask your health care provider for help if you need support or information about quitting drugs. Alcohol use  Do not drink alcohol if: ? Your health care provider tells you not to drink. ? You are pregnant, may be pregnant, or are planning to become pregnant.  If you drink alcohol: ? Limit how much you use to 0-1 drink a  day. ? Limit intake if you are breastfeeding.  Be aware of how much alcohol is in your drink. In the [place] one drink equals one 12 oz bottle of beer (355 mL), one 5 oz glass of wine (148 mL), or one 1 oz glass of hard liquor (44 mL). General instructions  Schedule regular health, dental, and eye exams.  Stay current with your vaccines.  Tell your health care provider if: ? You often feel depressed. ? You have ever been abused or do not feel safe at home. Summary  Adopting a healthy lifestyle and getting preventive care are important in promoting health and wellness.  Follow your health care provider's instructions about healthy diet, exercising, and getting tested or screened for diseases.  Follow your health care provider's instructions on monitoring your cholesterol and blood pressure. This information is not intended to replace advice given to you by your health care provider. Make sure you discuss any questions you have with your health care provider. Document Released: 09/01/2010 Document Revised: 02/09/2018 Document Reviewed: 02/09/2018 Elsevier  Patient Education  [employer].

## 2019-01-03 NOTE — Assessment & Plan Note (Signed)
Following with Dr Ellene Route Pain improved after epidural Dr Ellene Route advised stretching Taking flexeril prn, gabapentin, mobic

## 2019-01-03 NOTE — Progress Notes (Signed)
Subjective:    [name], female    DOB: Dec 17, 1955, 63 y.o.   MRN: [id number]  HPI She is here for a physical exam.     Medications and allergies reviewed with patient and updated if appropriate.  Patient Active Problem List   Diagnosis Date Noted  . Neuropathy 10/25/2017  . 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 Rib pain on left side 04/13/2016  . Diabetes ([place]) 12/31/2015  . Chest pain 12/31/2015  . OSA (obstructive sleep apnea) 03/28/2014  . Lumbar and sacral osteoarthritis 10/20/2012  . Lumbar canal stenosis 10/20/2012  . Morbid obesity ([place])   . Allergic rhinitis   . Osteoarthritis, knee 02/18/2011  . Hypertension   . Migraines   . GERD (gastroesophageal reflux disease)     Current Outpatient Medications on File Prior to Visit  Medication Sig Dispense Refill  . amLODipine (NORVASC) 5 MG tablet TAKE 1 TABLET BY MOUTH  DAILY 90 tablet 3  . blood glucose meter kit and supplies KIT Dispense based on patient and insurance preference. Use up to four times daily as directed. (FOR E11.9). 1 each 0  . Clobetasol Prop Emollient Base (CLOBETASOL PROPIONATE E) 0.05 % emollient cream Apply 1 application topically 2 (two) times daily. 30 g 0  . cyclobenzaprine (FLEXERIL) 10 MG tablet Take 1 tablet (10 mg total) by mouth 3 (three) times daily as needed. 60 tablet 5  . gabapentin (NEURONTIN) 300 MG capsule TAKE 4 CAPSULES BY MOUTH AT BEDTIME. 180 capsule 0  . meloxicam (MOBIC) 15 MG tablet Take 1 tablet (15 mg total) by mouth daily. 90 tablet 1  . metFORMIN (GLUCOPHAGE) 500 MG tablet Take 1 tablet (500 mg total) by mouth daily with breakfast. 90 tablet 3  . omeprazole (PRILOSEC) 20 MG capsule TAKE 1 CAPSULE BY MOUTH  DAILY 90 capsule 3  . rosuvastatin (CRESTOR) 5 MG tablet Take 1 tablet (5 mg total) by mouth daily. 90 tablet 3  . Semaglutide, 1 MG/DOSE, (OZEMPIC, 1 MG/DOSE,) 2 MG/1.5ML SOPN Inject 1 mg into the skin once a week. 3 pen 3   No current facility-administered  medications on file prior to visit.     Past Medical History:  Diagnosis Date  . Allergic rhinitis   . Allergy   . Arthritis     knee s/p TKR 2/13  . Depression   . GERD (gastroesophageal reflux disease)   . Hypertension   . Migraines   . OSA (obstructive sleep apnea)    uses cpap nightly  . Shingles outbreak 12/2013   L arm/torso  . Sleep apnea    cpap  . Spinal stenosis     Past Surgical History:  Procedure Laterality Date  . Knee replacement Left 2012  . KNEE SURGERY     both knees  . TOTAL KNEE ARTHROPLASTY  04/13/2011   Procedure: TOTAL KNEE ARTHROPLASTY;  Surgeon: Gearlean Alf, MD;  Location: [facility];  Service: Orthopedics;  Laterality: Left;    Social History   Socioeconomic History  . Marital status: Single    Spouse name: Not on file  . Number of children: Not on file  . Years of education: Not on file  . Highest education level: Not on file  Occupational History  . Occupation: retired  Scientific laboratory technician  . Financial resource strain: Not on file  . Food insecurity    Worry: Not on file    Inability: Not on file  . Transportation needs    Medical: Not  on file    Non-medical: Not on file  Tobacco Use  . Smoking status: Never Smoker  . Smokeless tobacco: Never Used  Substance and Sexual Activity  . Alcohol use: Yes    Alcohol/week: 1.0 standard drinks    Types: 1 Glasses of wine per week    Comment: rare social  . Drug use: No  . Sexual activity: Yes  Lifestyle  . Physical activity    Days per week: Not on file    Minutes per session: Not on file  . Stress: Not on file  Relationships  . Social connections    Talks on phone: Not on file    Gets together: Not on file    Attends religious service: Not on file    Active member of club or organization: Not on file    Attends meetings of clubs or organizations: Not on file    Relationship status: Not on file  Other Topics Concern  . Not on file  Social History Narrative  . Not on file     Family History  Problem Relation Age of Onset  . Arthritis Mother   . Breast cancer Mother   . Emphysema Mother   . Asthma Mother   . Heart disease Mother   . Arthritis Father   . Cancer Father   . Alcohol abuse Other   . Diabetes Other   . Colon cancer Neg Hx     Review of Systems     Objective:  There were no vitals filed for this visit. There were no vitals filed for this visit. There is no height or weight on file to calculate BMI.  BP Readings from Last 3 Encounters:  05/18/18 124/80  02/07/18 (!) 132/94  01/17/18 138/78    Wt Readings from Last 3 Encounters:  05/18/18 263 lb (119.3 kg)  02/07/18 269 lb (122 kg)  01/17/18 265 lb 12.8 oz (120.6 kg)     Physical Exam        Assessment & Plan:        This encounter was created in error - please disregard. 

## 2019-01-04 ENCOUNTER — Encounter: Payer: 59 | Admitting: Internal Medicine

## 2019-01-08 ENCOUNTER — Encounter: Payer: Self-pay | Admitting: Internal Medicine

## 2019-01-09 ENCOUNTER — Other Ambulatory Visit: Payer: Self-pay

## 2019-01-09 MED ORDER — GABAPENTIN 300 MG PO CAPS: ORAL_CAPSULE | ORAL | 0 refills | Status: DC

## 2019-01-09 NOTE — Patient Instructions (Addendum)
All other Health Maintenance issues reviewed.   All recommended immunizations and age-appropriate screenings are up-to-date or discussed.  Flu immunization administered today.    Medications reviewed and updated.  Changes include :  farxiga for your sugars.  Amitriptyline 25 mg at bedtime.     Your prescription(s) have been submitted to your pharmacy. Please take as directed and contact our office if you believe you are having problem(s) with the medication(s).    Please followup in 3 months    Health Maintenance, Female Adopting a healthy lifestyle and getting preventive care are important in promoting health and wellness. Ask your health care provider about:  The right schedule for you to have regular tests and exams.  Things you can do on your own to prevent diseases and keep yourself healthy. What should I know about diet, weight, and exercise? Eat a healthy diet   Eat a diet that includes plenty of vegetables, fruits, low-fat dairy products, and lean protein.  Do not eat a lot of foods that are high in solid fats, added sugars, or sodium. Maintain a healthy weight Body mass index (BMI) is used to identify weight problems. It estimates body fat based on height and weight. Your health care provider can help determine your BMI and help you achieve or maintain a healthy weight. Get regular exercise Get regular exercise. This is one of the most important things you can do for your health. Most adults should:  Exercise for at least 150 minutes each week. The exercise should increase your heart rate and make you sweat (moderate-intensity exercise).  Do strengthening exercises at least twice a week. This is in addition to the moderate-intensity exercise.  Spend less time sitting. Even light physical activity can be beneficial. Watch cholesterol and blood lipids Have your blood tested for lipids and cholesterol at 63 years of age, then have this test every 5 years. Have your  cholesterol levels checked more often if:  Your lipid or cholesterol levels are high.  You are older than 63 years of age.  You are at high risk for heart disease. What should I know about cancer screening? Depending on your health history and family history, you may need to have cancer screening at various ages. This may include screening for:  Breast cancer.  Cervical cancer.  Colorectal cancer.  Skin cancer.  Lung cancer. What should I know about heart disease, diabetes, and high blood pressure? Blood pressure and heart disease  High blood pressure causes heart disease and increases the risk of stroke. This is more likely to develop in people who have high blood pressure readings, are of African descent, or are overweight.  Have your blood pressure checked: ? Every 3-5 years if you are 51-13 years of age. ? Every year if you are 88 years old or older. Diabetes Have regular diabetes screenings. This checks your fasting blood sugar level. Have the screening done:  Once every three years after age 15 if you are at a normal weight and have a low risk for diabetes.  More often and at a younger age if you are overweight or have a high risk for diabetes. What should I know about preventing infection? Hepatitis B If you have a higher risk for hepatitis B, you should be screened for this virus. Talk with your health care provider to find out if you are at risk for hepatitis B infection. Hepatitis C Testing is recommended for:  Everyone born from [birth year] through 1965.  Anyone  with known risk factors for hepatitis C. Sexually transmitted infections (STIs)  Get screened for STIs, including gonorrhea and chlamydia, if: ? You are sexually active and are younger than 63 years of age. ? You are older than 63 years of age and your health care provider tells you that you are at risk for this type of infection. ? Your sexual activity has changed since you were last screened, and you are  at increased risk for chlamydia or gonorrhea. Ask your health care provider if you are at risk.  Ask your health care provider about whether you are at high risk for HIV. Your health care provider may recommend a prescription medicine to help prevent HIV infection. If you choose to take medicine to prevent HIV, you should first get tested for HIV. You should then be tested every 3 months for as long as you are taking the medicine. Pregnancy  If you are about to stop having your period (premenopausal) and you may become pregnant, seek counseling before you get pregnant.  Take 400 to 800 micrograms (mcg) of folic acid every day if you become pregnant.  Ask for birth control (contraception) if you want to prevent pregnancy. Osteoporosis and menopause Osteoporosis is a disease in which the bones lose minerals and strength with aging. This can result in bone fractures. If you are 31 years old or older, or if you are at risk for osteoporosis and fractures, ask your health care provider if you should:  Be screened for bone loss.  Take a calcium or vitamin D supplement to lower your risk of fractures.  Be given hormone replacement therapy (HRT) to treat symptoms of menopause. Follow these instructions at home: Lifestyle  Do not use any products that contain nicotine or tobacco, such as cigarettes, e-cigarettes, and chewing tobacco. If you need help quitting, ask your health care provider.  Do not use street drugs.  Do not share needles.  Ask your health care provider for help if you need support or information about quitting drugs. Alcohol use  Do not drink alcohol if: ? Your health care provider tells you not to drink. ? You are pregnant, may be pregnant, or are planning to become pregnant.  If you drink alcohol: ? Limit how much you use to 0-1 drink a day. ? Limit intake if you are breastfeeding.  Be aware of how much alcohol is in your drink. In the [place] one drink equals one 12 oz  bottle of beer (355 mL), one 5 oz glass of wine (148 mL), or one 1 oz glass of hard liquor (44 mL). General instructions  Schedule regular health, dental, and eye exams.  Stay current with your vaccines.  Tell your health care provider if: ? You often feel depressed. ? You have ever been abused or do not feel safe at home. Summary  Adopting a healthy lifestyle and getting preventive care are important in promoting health and wellness.  Follow your health care provider's instructions about healthy diet, exercising, and getting tested or screened for diseases.  Follow your health care provider's instructions on monitoring your cholesterol and blood pressure. This information is not intended to replace advice given to you by your health care provider. Make sure you discuss any questions you have with your health care provider. Document Released: 09/01/2010 Document Revised: 02/09/2018 Document Reviewed: 02/09/2018 Elsevier Patient Education  2020 [employer].

## 2019-01-09 NOTE — Progress Notes (Signed)
Subjective:    [name], female    DOB: December 26, 1955, 63 y.o.   MRN: [id number]  HPI She is here for a physical exam.   She just placed her mom in a nursing home two weeks ago.  Her mother initially liked being there, but just recently has been very upset that she was dropped off letter.  This is causing increased stress.  I discussed several months because she was caring for her mother and with the Covid pandemic she has not been eating as she should be eating and has not been exercising.  Because of this reason her sugars have been not well controlled-reviewed most recent blood work.  She is taking her medication.  Medications and allergies reviewed with patient and updated if appropriate.  Patient Active Problem List   Diagnosis Date Noted  . Neuropathy 10/25/2017  . 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 Rib pain on left side 04/13/2016  . Diabetes ([place]) 12/31/2015  . Chest pain 12/31/2015  . OSA (obstructive sleep apnea) 03/28/2014  . Lumbar and sacral osteoarthritis 10/20/2012  . Lumbar canal stenosis 10/20/2012  . Morbid obesity ([place])   . Allergic rhinitis   . Osteoarthritis, knee 02/18/2011  . Hypertension   . Migraines   . GERD (gastroesophageal reflux disease)     Current Outpatient Medications on File Prior to Visit  Medication Sig Dispense Refill  . amLODipine (NORVASC) 5 MG tablet TAKE 1 TABLET BY MOUTH  DAILY 90 tablet 3  . blood glucose meter kit and supplies KIT Dispense based on patient and insurance preference. Use up to four times daily as directed. (FOR E11.9). 1 each 0  . Clobetasol Prop Emollient Base (CLOBETASOL PROPIONATE E) 0.05 % emollient cream Apply 1 application topically 2 (two) times daily. 30 g 0  . cyclobenzaprine (FLEXERIL) 10 MG tablet Take 1 tablet (10 mg total) by mouth 3 (three) times daily as needed. 60 tablet 5  . gabapentin (NEURONTIN) 300 MG capsule TAKE 4 CAPSULES BY MOUTH AT BEDTIME. 120 capsule 0  . meloxicam (MOBIC) 15 MG tablet  Take 1 tablet (15 mg total) by mouth daily. 90 tablet 1  . omeprazole (PRILOSEC) 20 MG capsule TAKE 1 CAPSULE BY MOUTH  DAILY 90 capsule 3  . rosuvastatin (CRESTOR) 5 MG tablet Take 1 tablet (5 mg total) by mouth daily. 90 tablet 3  . Semaglutide, 1 MG/DOSE, (OZEMPIC, 1 MG/DOSE,) 2 MG/1.5ML SOPN Inject 1 mg into the skin once a week. 3 pen 3   No current facility-administered medications on file prior to visit.     Past Medical History:  Diagnosis Date  . Allergic rhinitis   . Allergy   . Arthritis     knee s/p TKR 2/13  . Depression   . GERD (gastroesophageal reflux disease)   . Hypertension   . Migraines   . OSA (obstructive sleep apnea)    uses cpap nightly  . Shingles outbreak 12/2013   L arm/torso  . Sleep apnea    cpap  . Spinal stenosis     Past Surgical History:  Procedure Laterality Date  . Knee replacement Left 2012  . KNEE SURGERY     both knees  . TOTAL KNEE ARTHROPLASTY  04/13/2011   Procedure: TOTAL KNEE ARTHROPLASTY;  Surgeon: Gearlean Alf, MD;  Location: [facility];  Service: Orthopedics;  Laterality: Left;    Social History   Socioeconomic History  . Marital status: Single    Spouse name: Not on  file  . Number of children: Not on file  . Years of education: Not on file  . Highest education level: Not on file  Occupational History  . Occupation: retired  Scientific laboratory technician  . Financial resource strain: Not on file  . Food insecurity    Worry: Not on file    Inability: Not on file  . Transportation needs    Medical: Not on file    Non-medical: Not on file  Tobacco Use  . Smoking status: Never Smoker  . Smokeless tobacco: Never Used  Substance and Sexual Activity  . Alcohol use: Yes    Alcohol/week: 1.0 standard drinks    Types: 1 Glasses of wine per week    Comment: rare social  . Drug use: No  . Sexual activity: Yes  Lifestyle  . Physical activity    Days per week: Not on file    Minutes per session: Not on file  . Stress: Not on file   Relationships  . Social Herbalist on phone: Not on file    Gets together: Not on file    Attends religious service: Not on file    Active member of club or organization: Not on file    Attends meetings of clubs or organizations: Not on file    Relationship status: Not on file  Other Topics Concern  . Not on file  Social History Narrative  . Not on file    Family History  Problem Relation Age of Onset  . Arthritis Mother   . Breast cancer Mother   . Emphysema Mother   . Asthma Mother   . Heart disease Mother   . Arthritis Father   . Cancer Father   . Alcohol abuse Other   . Diabetes Other   . Colon cancer Neg Hx     Review of Systems  Constitutional: Negative for chills and fever.  Eyes: Negative for visual disturbance.  Respiratory: Negative for cough, shortness of breath and wheezing.   Cardiovascular: Positive for chest pain (stress related) and leg swelling. Negative for palpitations.  Gastrointestinal: Positive for nausea (mid-afternoon on ). Negative for abdominal pain, blood in stool, constipation and diarrhea.       Mild GERD  Genitourinary: Negative for dysuria and hematuria.  Musculoskeletal: Positive for back pain. Negative for arthralgias.       Cramping in legs at night  Skin: Negative for color change and rash.  Neurological: Positive for headaches. Negative for light-headedness.  Psychiatric/Behavioral: Negative for dysphoric mood. The patient is nervous/anxious (related to mom).        Objective:   Vitals:   01/10/19 1459  BP: 124/78  Pulse: 91  Resp: 18  Temp: 98.2 F (36.8 C)  SpO2: 98%   Filed Weights   01/10/19 1459  Weight: 272 lb (123.4 kg)   Body mass index is 41.36 kg/m.  BP Readings from Last 3 Encounters:  01/10/19 124/78  05/18/18 124/80  02/07/18 (!) 132/94    Wt Readings from Last 3 Encounters:  01/10/19 272 lb (123.4 kg)  05/18/18 263 lb (119.3 kg)  02/07/18 269 lb (122 kg)     Physical Exam  Constitutional: She appears well-developed and well-nourished. No distress.  HENT:  Head: Normocephalic and atraumatic.  Right Ear: External ear normal. Normal ear canal and TM Left Ear: External ear normal.  Normal ear canal and TM Mouth/Throat: Oropharynx is clear and moist.  Eyes: Conjunctivae and EOM are normal.  Neck:  Neck supple. No tracheal deviation present. No thyromegaly present.  No carotid bruit  Cardiovascular: Normal rate, regular rhythm and normal heart sounds.   No murmur heard.  No edema. Pulmonary/Chest: Effort normal and breath sounds normal. No respiratory distress. She has no wheezes. She has no rales.  Breast: deferred   Abdominal: Soft. She exhibits no distension. There is no tenderness.  Lymphadenopathy: She has no cervical adenopathy.  Skin: Skin is warm and dry. She is not diaphoretic.  Psychiatric: She has a normal mood and affect. Her behavior is normal.   Diabetic Foot Exam - Simple   Simple Foot Form Diabetic Foot exam was performed with the following findings: Yes   Visual Inspection No deformities, no ulcerations, no other skin breakdown bilaterally: Yes Sensation Testing Intact to touch and monofilament testing bilaterally: Yes Pulse Check Posterior Tibialis and Dorsalis pulse intact bilaterally: Yes Comments         Assessment & Plan:   Physical exam: Screening blood work    reviewed-done previously Immunizations  Discussed shingrix, flu vaccine today Colonoscopy  Up to date  Mammogram  Due - will schedule Gyn   Due - schedule  Eye exams  Due - will schedule Exercise  none Weight  Stressed weight loss Substance abuse  none  See Problem List for Assessment and Plan of chronic medical problems.   FU in 3 months with blood work prior

## 2019-01-10 ENCOUNTER — Other Ambulatory Visit: Payer: Self-pay

## 2019-01-10 ENCOUNTER — Encounter: Payer: Self-pay | Admitting: Internal Medicine

## 2019-01-10 ENCOUNTER — Ambulatory Visit (INDEPENDENT_AMBULATORY_CARE_PROVIDER_SITE_OTHER): Payer: 59 | Admitting: Internal Medicine

## 2019-01-10 VITALS — BP 124/78 | HR 91 | Temp 98.2°F | Resp 18 | Ht 68.0 in | Wt 272.0 lb

## 2019-01-10 DIAGNOSIS — Z794 Long term (current) use of insulin: Secondary | ICD-10-CM

## 2019-01-10 DIAGNOSIS — E119 Type 2 diabetes mellitus without complications: Secondary | ICD-10-CM | POA: Diagnosis not present

## 2019-01-10 DIAGNOSIS — K219 Gastro-esophageal reflux disease without esophagitis: Secondary | ICD-10-CM | POA: Diagnosis not present

## 2019-01-10 DIAGNOSIS — Z0000 Encounter for general adult medical examination without abnormal findings: Secondary | ICD-10-CM | POA: Diagnosis not present

## 2019-01-10 DIAGNOSIS — M48061 Spinal stenosis, lumbar region without neurogenic claudication: Secondary | ICD-10-CM

## 2019-01-10 DIAGNOSIS — Z23 Encounter for immunization: Secondary | ICD-10-CM

## 2019-01-10 DIAGNOSIS — I10 Essential (primary) hypertension: Secondary | ICD-10-CM | POA: Diagnosis not present

## 2019-01-10 DIAGNOSIS — G629 Polyneuropathy, unspecified: Secondary | ICD-10-CM

## 2019-01-10 MED ORDER — FARXIGA 5 MG PO TABS: 5.0000 mg | ORAL_TABLET | Freq: Every day | ORAL | 1 refills | Status: DC

## 2019-01-10 MED ORDER — AMITRIPTYLINE HCL 25 MG PO TABS: 25.0000 mg | ORAL_TABLET | Freq: Every day | ORAL | 1 refills | Status: DC

## 2019-01-10 NOTE — Assessment & Plan Note (Signed)
Following with dr elsner Having intermittent epidurals Taking flexeril, gabapentin and mobic prn

## 2019-01-10 NOTE — Assessment & Plan Note (Signed)
BP well controlled Current regimen effective and well tolerated Continue current medications at current doses  

## 2019-01-10 NOTE — Assessment & Plan Note (Signed)
encouarged weight loss Stressed regular exercise Work on [employer]

## 2019-01-10 NOTE — Assessment & Plan Note (Signed)
Poorly controlled-A1c more than 12 Was well controlled 8 months ago She has not been exercising and appropriately Continue Ozempic 1 mg weekly Start Farxiga savings card given Stressed the importance of regular exercise Will work on improving her diet Follow-up months

## 2019-01-10 NOTE — Assessment & Plan Note (Signed)
GERD controlled Continue daily medication  

## 2019-01-10 NOTE — Assessment & Plan Note (Signed)
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  She does not think she would tolerate any during the day because it does make her little groggy We will try amitriptyline 25 mg at night-she has been on this before in the past and did well with it-may make her drowsy because she is also taking gabapentin If not effective will consider Cymbalta

## 2019-01-11 ENCOUNTER — Encounter: Payer: Self-pay | Admitting: Internal Medicine

## 2019-01-12 MED ORDER — JARDIANCE 10 MG PO TABS: 10.0000 mg | ORAL_TABLET | Freq: Every day | ORAL | 1 refills | Status: DC

## 2019-02-10 ENCOUNTER — Encounter: Payer: Self-pay | Admitting: Internal Medicine

## 2019-02-10 MED ORDER — GABAPENTIN 300 MG PO CAPS: ORAL_CAPSULE | ORAL | 1 refills | Status: DC

## 2019-06-05 ENCOUNTER — Other Ambulatory Visit: Payer: Self-pay | Admitting: Internal Medicine

## 2019-06-11 ENCOUNTER — Other Ambulatory Visit: Payer: Self-pay | Admitting: Internal Medicine

## 2019-06-21 ENCOUNTER — Telehealth: Payer: Self-pay

## 2019-06-21 ENCOUNTER — Other Ambulatory Visit: Payer: Self-pay | Admitting: Internal Medicine

## 2019-06-21 DIAGNOSIS — Z794 Long term (current) use of insulin: Secondary | ICD-10-CM

## 2019-06-21 DIAGNOSIS — E119 Type 2 diabetes mellitus without complications: Secondary | ICD-10-CM

## 2019-06-21 DIAGNOSIS — I10 Essential (primary) hypertension: Secondary | ICD-10-CM

## 2019-06-21 NOTE — Telephone Encounter (Signed)
New message   The patient is asking for a blood work order to be entered before appt next week.

## 2019-06-21 NOTE — Telephone Encounter (Signed)
ordered

## 2019-06-22 MED ORDER — MELOXICAM 15 MG PO TABS: 15.0000 mg | ORAL_TABLET | Freq: Every day | ORAL | 1 refills | Status: DC

## 2019-06-22 NOTE — Telephone Encounter (Signed)
Pt aware and will go to [facility] to have labs drawn.

## 2019-06-23 ENCOUNTER — Other Ambulatory Visit (INDEPENDENT_AMBULATORY_CARE_PROVIDER_SITE_OTHER): Payer: 59

## 2019-06-23 DIAGNOSIS — I10 Essential (primary) hypertension: Secondary | ICD-10-CM | POA: Diagnosis not present

## 2019-06-23 DIAGNOSIS — Z794 Long term (current) use of insulin: Secondary | ICD-10-CM | POA: Diagnosis not present

## 2019-06-23 DIAGNOSIS — E119 Type 2 diabetes mellitus without complications: Secondary | ICD-10-CM

## 2019-06-23 LAB — HEMOGLOBIN A1C: Hgb A1c MFr Bld: 7 % — ABNORMAL HIGH (ref 4.6–6.5)

## 2019-06-23 LAB — COMPREHENSIVE METABOLIC PANEL
ALT: 10 U/L (ref 0–35)
AST: 10 U/L (ref 0–37)
Albumin: 4.1 g/dL (ref 3.5–5.2)
Alkaline Phosphatase: 89 U/L (ref 39–117)
BUN: 30 mg/dL — ABNORMAL HIGH (ref 6–23)
CO2: 29 mEq/L (ref 19–32)
Calcium: 9.1 mg/dL (ref 8.4–10.5)
Chloride: 103 mEq/L (ref 96–112)
Creatinine, Ser: 0.62 mg/dL (ref 0.40–1.20)
GFR: 97.04 mL/min (ref >60.00)
Glucose, Bld: 131 mg/dL — ABNORMAL HIGH (ref 70–99)
Potassium: 3.8 mEq/L (ref 3.5–5.1)
Sodium: 140 mEq/L (ref 135–145)
Total Bilirubin: 0.4 mg/dL (ref 0.2–1.2)
Total Protein: 7.2 g/dL (ref 6.0–8.3)

## 2019-06-23 LAB — LIPID PANEL
Cholesterol: 186 mg/dL (ref 0–200)
HDL: 44.1 mg/dL (ref >39.00)
LDL Cholesterol: 115 mg/dL — ABNORMAL HIGH (ref 0–99)
NonHDL: 142
Total CHOL/HDL Ratio: 4
Triglycerides: 133 mg/dL (ref 0.0–149.0)
VLDL: 26.6 mg/dL (ref 0.0–40.0)

## 2019-06-26 NOTE — Progress Notes (Signed)
Subjective:    [name], female    DOB: [birth year], 64 y.o.   MRN: [id number]  HPI The patient is here for follow up of their chronic medical problems, including hypertension, diabetes, gerd, neuropathy from prior back surgery, obesity  She is taking all of her medications as prescribed.    She is not exercising regularly.   She is active.  She knows she needs to get back to walking.  She has lost weight.  She is eating primarily meat and eggs - no carbs.      Medications and allergies reviewed with patient and updated if appropriate.  Patient Active Problem List   Diagnosis Date Noted  . Neuropathy 10/25/2017  . 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 Rib pain on left side 04/13/2016  . Diabetes ([place]) 12/31/2015  . Chest pain 12/31/2015  . OSA (obstructive sleep apnea) 03/28/2014  . Lumbar and sacral osteoarthritis 10/20/2012  . Lumbar canal stenosis 10/20/2012  . Morbid obesity ([place])   . Allergic rhinitis   . Osteoarthritis, knee 02/18/2011  . Hypertension   . Migraines   . GERD (gastroesophageal reflux disease)     Current Outpatient Medications on File Prior to Visit  Medication Sig Dispense Refill  . amitriptyline (ELAVIL) 25 MG tablet Take 1 tablet (25 mg total) by mouth at bedtime. 90 tablet 1  . amLODipine (NORVASC) 5 MG tablet TAKE 1 TABLET BY MOUTH  DAILY 90 tablet 3  . blood glucose meter kit and supplies KIT Dispense based on patient and insurance preference. Use up to four times daily as directed. (FOR E11.9). 1 each 0  . cyclobenzaprine (FLEXERIL) 10 MG tablet Take 1 tablet (10 mg total) by mouth 3 (three) times daily as needed. 60 tablet 5  . gabapentin (NEURONTIN) 300 MG capsule TAKE 4 CAPSULES BY MOUTH AT BEDTIME. Need office visit for more refills. 120 capsule 0  . JARDIANCE 10 MG TABS tablet TAKE 1 TABLET BY MOUTH  DAILY BEFORE BREAKFAST 90 tablet 0  . meloxicam (MOBIC) 15 MG tablet Take 1 tablet (15 mg total) by mouth daily. 90 tablet 1  . omeprazole  (PRILOSEC) 20 MG capsule TAKE 1 CAPSULE BY MOUTH  DAILY 90 capsule 3  . rosuvastatin (CRESTOR) 5 MG tablet Take 1 tablet (5 mg total) by mouth daily. 90 tablet 3  . Semaglutide, 1 MG/DOSE, (OZEMPIC, 1 MG/DOSE,) 2 MG/1.5ML SOPN Inject 1 mg into the skin once a week. 3 pen 3   No current facility-administered medications on file prior to visit.    Past Medical History:  Diagnosis Date  . Allergic rhinitis   . Allergy   . Arthritis     knee s/p TKR 2/13  . Depression   . GERD (gastroesophageal reflux disease)   . Hypertension   . Migraines   . OSA (obstructive sleep apnea)    uses cpap nightly  . Shingles outbreak 12/2013   L arm/torso  . Sleep apnea    cpap  . Spinal stenosis     Past Surgical History:  Procedure Laterality Date  . Knee replacement Left 2012  . KNEE SURGERY     both knees  . TOTAL KNEE ARTHROPLASTY  04/13/2011   Procedure: TOTAL KNEE ARTHROPLASTY;  Surgeon: Gearlean Alf, MD;  Location: [facility];  Service: Orthopedics;  Laterality: Left;    Social History   Socioeconomic History  . Marital status: Single    Spouse name: Not on file  . Number of children:  Not on file  . Years of education: Not on file  . Highest education level: Not on file  Occupational History  . Occupation: retired  Tobacco Use  . Smoking status: Never Smoker  . Smokeless tobacco: Never Used  Substance and Sexual Activity  . Alcohol use: Yes    Alcohol/week: 1.0 standard drinks    Types: 1 Glasses of wine per week    Comment: rare social  . Drug use: No  . Sexual activity: Yes  Other Topics Concern  . Not on file  Social History Narrative  . Not on file   Social Determinants of Health   Financial Resource Strain:   . Difficulty of Paying Living Expenses:   Food Insecurity:   . Worried About Charity fundraiser in the Last Year:   . Arboriculturist in the Last Year:   Transportation Needs:   . Film/video editor (Medical):   Marland Kitchen Lack of Transportation  (Non-Medical):   Physical Activity:   . Days of Exercise per Week:   . Minutes of Exercise per Session:   Stress:   . Feeling of Stress :   Social Connections:   . Frequency of Communication with Friends and Family:   . Frequency of Social Gatherings with Friends and Family:   . Attends Religious Services:   . Active Member of Clubs or Organizations:   . Attends Archivist Meetings:   Marland Kitchen Marital Status:     Family History  Problem Relation Age of Onset  . Arthritis Mother   . Breast cancer Mother   . Emphysema Mother   . Asthma Mother   . Heart disease Mother   . Arthritis Father   . Cancer Father   . Alcohol abuse Other   . Diabetes Other   . Colon cancer Neg Hx     Review of Systems  Constitutional: Negative for chills and fever.  Respiratory: Positive for cough (allergy related). Negative for shortness of breath and wheezing.   Cardiovascular: Negative for chest pain, palpitations and leg swelling (minimal).  Neurological: Positive for headaches (occ). Negative for light-headedness.       Objective:   Vitals:   06/27/19 1358  BP: 132/84  Pulse: 94  Resp: 16  Temp: 98.4 F (36.9 C)  SpO2: 96%   BP Readings from Last 3 Encounters:  06/27/19 132/84  01/10/19 124/78  05/18/18 124/80   Wt Readings from Last 3 Encounters:  06/27/19 263 lb 3.2 oz (119.4 kg)  01/10/19 272 lb (123.4 kg)  05/18/18 263 lb (119.3 kg)   Body mass index is 40.02 kg/m.   Physical Exam    Constitutional: Appears well-developed and well-nourished. No distress.  HENT:  Head: Normocephalic and atraumatic.  Neck: Neck supple. No tracheal deviation present. No thyromegaly present.  No cervical lymphadenopathy Cardiovascular: Normal rate, regular rhythm and normal heart sounds.   No murmur heard. No carotid bruit .  No edema Pulmonary/Chest: Effort normal and breath sounds normal. No respiratory distress. No has no wheezes. No rales.  Skin: Skin is warm and dry. Not  diaphoretic.  Psychiatric: Normal mood and affect. Behavior is normal.      Assessment & Plan:    See Problem List for Assessment and Plan of chronic medical problems.    This visit occurred during the SARS-CoV-2 public health emergency.  Safety protocols were in place, including screening questions prior to the visit, additional usage of staff PPE, and extensive cleaning of exam  room while observing appropriate contact time as indicated for disinfecting solutions.

## 2019-06-26 NOTE — Patient Instructions (Addendum)
  Blood work was ordered for next visit.     Medications reviewed and updated.  Changes include :   Increase gabapentin to 300 mg in morning, 300mg  in the afternoon and continue the same dose at night.   Your prescription(s) have been submitted to your pharmacy. Please take as directed and contact our office if you believe you are having problem(s) with the medication(s).     Please followup in 6 months

## 2019-06-27 ENCOUNTER — Encounter: Payer: Self-pay | Admitting: Internal Medicine

## 2019-06-27 ENCOUNTER — Ambulatory Visit: Payer: 59 | Admitting: Internal Medicine

## 2019-06-27 ENCOUNTER — Other Ambulatory Visit: Payer: Self-pay

## 2019-06-27 VITALS — BP 132/84 | HR 94 | Temp 98.4°F | Resp 16 | Ht 68.0 in | Wt 263.2 lb

## 2019-06-27 DIAGNOSIS — K219 Gastro-esophageal reflux disease without esophagitis: Secondary | ICD-10-CM | POA: Diagnosis not present

## 2019-06-27 DIAGNOSIS — I10 Essential (primary) hypertension: Secondary | ICD-10-CM

## 2019-06-27 DIAGNOSIS — G629 Polyneuropathy, unspecified: Secondary | ICD-10-CM

## 2019-06-27 DIAGNOSIS — E119 Type 2 diabetes mellitus without complications: Secondary | ICD-10-CM | POA: Diagnosis not present

## 2019-06-27 DIAGNOSIS — M48061 Spinal stenosis, lumbar region without neurogenic claudication: Secondary | ICD-10-CM

## 2019-06-27 DIAGNOSIS — Z794 Long term (current) use of insulin: Secondary | ICD-10-CM

## 2019-06-27 DIAGNOSIS — E782 Mixed hyperlipidemia: Secondary | ICD-10-CM

## 2019-06-27 DIAGNOSIS — E785 Hyperlipidemia, unspecified: Secondary | ICD-10-CM | POA: Insufficient documentation

## 2019-06-27 MED ORDER — OZEMPIC (1 MG/DOSE) 2 MG/1.5ML ~~LOC~~ SOPN: 1.0000 mg | PEN_INJECTOR | SUBCUTANEOUS | 3 refills | Status: DC

## 2019-06-27 MED ORDER — GABAPENTIN 300 MG PO CAPS: ORAL_CAPSULE | ORAL | 1 refills | Status: DC

## 2019-06-27 NOTE — Assessment & Plan Note (Signed)
Chronic GERD controlled Continue daily medication-omeprazole 20 mg daily  

## 2019-06-27 NOTE — Assessment & Plan Note (Signed)
Chronic Related to her back and ? DM Pain is worse Increase gabapentin to 300 mg x 2 during day and continue 1200 mg at HS

## 2019-06-27 NOTE — Assessment & Plan Note (Signed)
Chronic BP well controlled Current regimen effective and well tolerated Continue current medications at current doses cmp reviewed  

## 2019-06-27 NOTE — Assessment & Plan Note (Signed)
Chronic Does stretching Taking flexeril, mobic and gabapentin Increase gabapentin to 300 mg in am, afternoon, and continue 1200 mg at HS

## 2019-06-27 NOTE — Assessment & Plan Note (Signed)
Chronic She has lost weight and will continue her weight loss efforts Not eating any sugars or carbohydrates-mostly egg and meats-we will incorporate some vegetables Very active-we will start walking regularly Follow-up in 6 months

## 2019-06-27 NOTE — Assessment & Plan Note (Signed)
Lab Results  Component Value Date   HGBA1C 7.0 (H) 06/23/2019   Sugars much better controlled-goal is less than 7.0% and I know she will reach that by her next visit Continue current medication She will start walking Continue weight loss efforts

## 2019-06-27 NOTE — Assessment & Plan Note (Signed)
Chronic Lipids controlled  Continue daily statin Regular exercise and healthy diet encouraged continue working on weight loss

## 2019-07-06 ENCOUNTER — Telehealth: Payer: Self-pay | Admitting: Internal Medicine

## 2019-07-06 MED ORDER — AMITRIPTYLINE HCL 25 MG PO TABS: 25.0000 mg | ORAL_TABLET | Freq: Every day | ORAL | 1 refills | Status: DC

## 2019-07-06 NOTE — Telephone Encounter (Signed)
New message:   1.Medication Requested: amitriptyline (ELAVIL) 25 MG tablet 2. Pharmacy (Name, [address], [place]): [place] [place] 3. On Med List: Yes  4. Last Visit with PCP: 06/27/19  5. Next visit date with PCP: 12/27/19   Agent: Please be advised that RX refills may take up to 3 business days. We ask that you follow-up with your pharmacy.

## 2019-07-06 NOTE — Telephone Encounter (Signed)
Rx sent 

## 2019-07-23 ENCOUNTER — Other Ambulatory Visit: Payer: Self-pay | Admitting: Internal Medicine

## 2019-08-16 ENCOUNTER — Other Ambulatory Visit: Payer: Self-pay | Admitting: Internal Medicine

## 2019-09-07 ENCOUNTER — Other Ambulatory Visit: Payer: Self-pay | Admitting: Internal Medicine

## 2019-10-16 ENCOUNTER — Other Ambulatory Visit: Payer: Self-pay | Admitting: Internal Medicine

## 2019-10-28 ENCOUNTER — Other Ambulatory Visit: Payer: Self-pay | Admitting: Internal Medicine

## 2019-11-06 ENCOUNTER — Other Ambulatory Visit: Payer: Self-pay | Admitting: Internal Medicine

## 2019-12-20 ENCOUNTER — Other Ambulatory Visit: Payer: Self-pay

## 2019-12-20 ENCOUNTER — Other Ambulatory Visit (INDEPENDENT_AMBULATORY_CARE_PROVIDER_SITE_OTHER): Payer: 59

## 2019-12-20 DIAGNOSIS — E782 Mixed hyperlipidemia: Secondary | ICD-10-CM

## 2019-12-20 DIAGNOSIS — E119 Type 2 diabetes mellitus without complications: Secondary | ICD-10-CM | POA: Diagnosis not present

## 2019-12-20 DIAGNOSIS — Z794 Long term (current) use of insulin: Secondary | ICD-10-CM

## 2019-12-20 DIAGNOSIS — I10 Essential (primary) hypertension: Secondary | ICD-10-CM | POA: Diagnosis not present

## 2019-12-20 LAB — MICROALBUMIN / CREATININE URINE RATIO
Creatinine,U: 27.7 mg/dL
Microalb Creat Ratio: 2.5 mg/g (ref 0.0–30.0)
Microalb, Ur: <0.7 mg/dL (ref 0.0–1.9)

## 2019-12-20 LAB — COMPREHENSIVE METABOLIC PANEL
ALT: 16 U/L (ref 0–35)
AST: 15 U/L (ref 0–37)
Albumin: 4.1 g/dL (ref 3.5–5.2)
Alkaline Phosphatase: 86 U/L (ref 39–117)
BUN: 15 mg/dL (ref 6–23)
CO2: 26 mEq/L (ref 19–32)
Calcium: 9.5 mg/dL (ref 8.4–10.5)
Chloride: 102 mEq/L (ref 96–112)
Creatinine, Ser: 0.63 mg/dL (ref 0.40–1.20)
GFR: 94.74 mL/min (ref >60.00)
Glucose, Bld: 149 mg/dL — ABNORMAL HIGH (ref 70–99)
Potassium: 3.9 mEq/L (ref 3.5–5.1)
Sodium: 137 mEq/L (ref 135–145)
Total Bilirubin: 0.6 mg/dL (ref 0.2–1.2)
Total Protein: 7.2 g/dL (ref 6.0–8.3)

## 2019-12-20 LAB — CBC WITH DIFFERENTIAL/PLATELET
Basophils Absolute: 0.1 10*3/uL (ref 0.0–0.1)
Basophils Relative: 1.1 % (ref 0.0–3.0)
Eosinophils Absolute: 0.3 10*3/uL (ref 0.0–0.7)
Eosinophils Relative: 3.8 % (ref 0.0–5.0)
HCT: 43.4 % (ref 36.0–46.0)
Hemoglobin: 14.7 g/dL (ref 12.0–15.0)
Lymphocytes Relative: 27 % (ref 12.0–46.0)
Lymphs Abs: 2.3 10*3/uL (ref 0.7–4.0)
MCHC: 33.8 g/dL (ref 30.0–36.0)
MCV: 84.9 fl (ref 78.0–100.0)
Monocytes Absolute: 0.5 10*3/uL (ref 0.1–1.0)
Monocytes Relative: 5.9 % (ref 3.0–12.0)
Neutro Abs: 5.2 10*3/uL (ref 1.4–7.7)
Neutrophils Relative %: 62.2 % (ref 43.0–77.0)
Platelets: 251 10*3/uL (ref 150.0–400.0)
RBC: 5.11 Mil/uL (ref 3.87–5.11)
RDW: 14.2 % (ref 11.5–15.5)
WBC: 8.4 10*3/uL (ref 4.0–10.5)

## 2019-12-20 LAB — LIPID PANEL
Cholesterol: 185 mg/dL (ref 0–200)
HDL: 49.1 mg/dL (ref >39.00)
LDL Cholesterol: 101 mg/dL — ABNORMAL HIGH (ref 0–99)
NonHDL: 135.47
Total CHOL/HDL Ratio: 4
Triglycerides: 170 mg/dL — ABNORMAL HIGH (ref 0.0–149.0)
VLDL: 34 mg/dL (ref 0.0–40.0)

## 2019-12-20 LAB — HEMOGLOBIN A1C: Hgb A1c MFr Bld: 8.7 % — ABNORMAL HIGH (ref 4.6–6.5)

## 2019-12-20 LAB — TSH: TSH: 1.7 u[IU]/mL (ref 0.35–4.50)

## 2019-12-26 ENCOUNTER — Encounter: Payer: Self-pay | Admitting: Internal Medicine

## 2019-12-26 NOTE — Progress Notes (Signed)
Subjective:    [name], female    DOB: 05-Jan-1956, 64 y.o.   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    She is exercising some.  She has been doing a lot of cleaning out and has been active.    She has had increased stress and when she is stressed she eats bad.     Medications and allergies reviewed with patient and updated if appropriate.  Patient Active Problem List   Diagnosis Date Noted  . BMI 40.0-44.9, adult ([place]) 12/27/2019  . Hyperlipidemia 06/27/2019  . Neuropathy 10/25/2017  . 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 Rib pain on left side 04/13/2016  . Diabetes ([place]) 12/31/2015  . Chest pain 12/31/2015  . OSA (obstructive sleep apnea) 03/28/2014  . Lumbar and sacral osteoarthritis 10/20/2012  . Lumbar canal stenosis 10/20/2012  . Morbid obesity ([place])   . Allergic rhinitis   . Osteoarthritis, knee 02/18/2011  . Hypertension   . Migraines   . GERD (gastroesophageal reflux disease)     Current Outpatient Medications on File Prior to Visit  Medication Sig Dispense Refill  . amitriptyline (ELAVIL) 25 MG tablet TAKE 1 TABLET BY MOUTH AT  BEDTIME 90 tablet 3  . amLODipine (NORVASC) 5 MG tablet Take 1 tablet (5 mg total) by mouth daily. Annual appt due in Nov must see provider for future refills 90 tablet 0  . blood glucose meter kit and supplies KIT Dispense based on patient and insurance preference. Use up to four times daily as directed. (FOR E11.9). 1 each 0  . cyclobenzaprine (FLEXERIL) 10 MG tablet TAKE 1 TABLET BY MOUTH 3  TIMES DAILY AS NEEDED 60 tablet 5  . gabapentin (NEURONTIN) 300 MG capsule TAKE 1 CAPSULE BY MOUTH IN  THE MORNING , 1 CAPSULE IN  THE AFTERNOON , AND 4  CAPSULES AT BEDTIME 540 capsule 3  . meloxicam (MOBIC) 15 MG tablet Take 1 tablet (15 mg total) by  mouth daily. 90 tablet 1  . omeprazole (PRILOSEC) 20 MG capsule Take 1 capsule (20 mg total) by mouth daily. Annual appt due in Nov must see provider for future refills 90 capsule 0  . OZEMPIC, 1 MG/DOSE, 4 MG/3ML SOPN     . rosuvastatin (CRESTOR) 5 MG tablet Take 1 tablet (5 mg total) by mouth daily. 90 tablet 3  . Semaglutide, 1 MG/DOSE, (OZEMPIC, 1 MG/DOSE,) 2 MG/1.5ML SOPN Inject 1 mg into the skin once a week. 12 pen 3   No current facility-administered medications on file prior to visit.    Past Medical History:  Diagnosis Date  . Allergic rhinitis   . Allergy   . Arthritis     knee s/p TKR 2/13  . Depression   . GERD (gastroesophageal reflux disease)   . Hypertension   . Migraines   . OSA (obstructive sleep apnea)    uses cpap nightly  . Shingles outbreak 12/2013   L arm/torso  . Sleep apnea    cpap  . Spinal stenosis     Past Surgical History:  Procedure Laterality Date  . Knee replacement Left 2012  . KNEE SURGERY     both knees  . TOTAL KNEE ARTHROPLASTY  04/13/2011   Procedure: TOTAL KNEE ARTHROPLASTY;  Surgeon: Gearlean Alf, MD;  Location: [facility];  Service: Orthopedics;  Laterality: Left;    Social History   Socioeconomic History  . Marital status: Single    Spouse name: Not on file  . Number of children: Not on file  . Years of education: Not on file  . Highest education level: Not on file  Occupational History  . Occupation: retired  Tobacco Use  . Smoking status: Never Smoker  . Smokeless tobacco: Never Used  Substance and Sexual Activity  . Alcohol use: Yes    Alcohol/week: 1.0 standard drink    Types: 1 Glasses of wine per week    Comment: rare social  . Drug use: No  . Sexual activity: Yes  Other Topics Concern  . Not on file  Social History Narrative  . Not on file   Social Determinants of Health   Financial Resource Strain:   . Difficulty of Paying Living Expenses: Not on file  Food Insecurity:   . Worried About Sales executive in the Last Year: Not on file  . Ran Out of Food in the Last Year: Not on file  Transportation Needs:   . Lack of Transportation (Medical): Not on file  . Lack of Transportation (Non-Medical): Not on file  Physical Activity:   . Days of Exercise per Week: Not on file  . Minutes of Exercise per Session: Not on file  Stress:   . Feeling of Stress : Not on file  Social Connections:   . Frequency of Communication with Friends and Family: Not on file  . Frequency of Social Gatherings with Friends and Family: Not on file  . Attends Religious Services: Not on file  . Active Member of Clubs or Organizations: Not on file  . Attends Archivist Meetings: Not on file  . Marital Status: Not on file    Family History  Problem Relation Age of Onset  . Arthritis Mother   . Breast cancer Mother   . Emphysema Mother   . Asthma Mother   . Heart disease Mother   . Arthritis Father   . Cancer Father   . Alcohol abuse Other   . Diabetes Other   . Colon cancer Neg Hx     Review of Systems  Constitutional: Negative for chills and fever.  Respiratory: Negative for cough, shortness of breath and wheezing.   Cardiovascular: Negative for chest pain, palpitations and leg swelling.  Neurological: Positive for headaches (occ). Negative for dizziness and light-headedness.       Objective:   Vitals:   12/27/19 1404  BP: 120/76  Pulse: 98  Temp: 98.3 F (36.8 C)  SpO2: 97%   BP Readings from Last 3 Encounters:  12/27/19 120/76  06/27/19 132/84  01/10/19 124/78   Wt Readings from Last 3 Encounters:  12/27/19 267 lb (121.1 kg)  06/27/19 263 lb 3.2 oz (119.4 kg)  01/10/19 272 lb (123.4 kg)   Body mass index is 40.6 kg/m.   Physical Exam    Constitutional: Appears well-developed and well-nourished. No distress.  HENT:  Head: Normocephalic and atraumatic.  Neck: Neck supple. No tracheal deviation present. No thyromegaly present.  No cervical  lymphadenopathy Cardiovascular: Normal rate, regular rhythm and normal heart sounds.   No murmur heard. No carotid bruit .  No edema Pulmonary/Chest: Effort normal and breath sounds normal. No respiratory distress. No has no wheezes. No rales.  Skin: Skin is warm and dry. Not diaphoretic.  Psychiatric: Normal mood and affect. Behavior is normal.   Lab  Results  Component Value Date   WBC 8.4 12/20/2019   HGB 14.7 12/20/2019   HCT 43.4 12/20/2019   PLT 251.0 12/20/2019   GLUCOSE 149 (H) 12/20/2019   CHOL 185 12/20/2019   TRIG 170.0 (H) 12/20/2019   HDL 49.10 12/20/2019   LDLDIRECT 134.0 10/21/2017   LDLCALC 101 (H) 12/20/2019   ALT 16 12/20/2019   AST 15 12/20/2019   NA 137 12/20/2019   K 3.9 12/20/2019   CL 102 12/20/2019   CREATININE 0.63 12/20/2019   BUN 15 12/20/2019   CO2 26 12/20/2019   TSH 1.70 12/20/2019   INR 1.00 04/07/2011   HGBA1C 8.7 (H) 12/20/2019   MICROALBUR <0.7 12/20/2019      Assessment & Plan:    See Problem List for Assessment and Plan of chronic medical problems.    This visit occurred during the SARS-CoV-2 public health emergency.  Safety protocols were in place, including screening questions prior to the visit, additional usage of staff PPE, and extensive cleaning of exam room while observing appropriate contact time as indicated for disinfecting solutions.

## 2019-12-26 NOTE — Patient Instructions (Addendum)
  Blood work reviewed.     Medications reviewed and updated.  Changes include :   Increase jardiance  Your prescription(s) have been submitted to your pharmacy. Please take as directed and contact our office if you believe you are having problem(s) with the medication(s).    Please followup in 6 months

## 2019-12-27 ENCOUNTER — Ambulatory Visit: Payer: 59 | Admitting: Internal Medicine

## 2019-12-27 ENCOUNTER — Other Ambulatory Visit: Payer: Self-pay

## 2019-12-27 VITALS — BP 120/76 | HR 98 | Temp 98.3°F | Ht 68.0 in | Wt 267.0 lb

## 2019-12-27 DIAGNOSIS — K219 Gastro-esophageal reflux disease without esophagitis: Secondary | ICD-10-CM | POA: Diagnosis not present

## 2019-12-27 DIAGNOSIS — Z794 Long term (current) use of insulin: Secondary | ICD-10-CM

## 2019-12-27 DIAGNOSIS — I10 Essential (primary) hypertension: Secondary | ICD-10-CM

## 2019-12-27 DIAGNOSIS — G629 Polyneuropathy, unspecified: Secondary | ICD-10-CM

## 2019-12-27 DIAGNOSIS — E1165 Type 2 diabetes mellitus with hyperglycemia: Secondary | ICD-10-CM | POA: Diagnosis not present

## 2019-12-27 DIAGNOSIS — Z6841 Body Mass Index (BMI) 40.0 and over, adult: Secondary | ICD-10-CM

## 2019-12-27 DIAGNOSIS — E782 Mixed hyperlipidemia: Secondary | ICD-10-CM

## 2019-12-27 MED ORDER — EMPAGLIFLOZIN 25 MG PO TABS: 25.0000 mg | ORAL_TABLET | Freq: Every day | ORAL | 2 refills | Status: DC

## 2019-12-27 NOTE — Assessment & Plan Note (Signed)
Morbidly obese Stressed regular exercise, more compliance with a diabetic diet, decrease portions She understands how important weight loss is

## 2019-12-27 NOTE — Assessment & Plan Note (Signed)
Chronic BMI of 40.60 Stressed regular exercise-she will start walking again-treadmill or outside She has been eating too many sugars and carbohydrates because of increased stress.  Stressed that she needs to get back to her diabetic diet and decrease portions She will be working on weight loss over the next 6 months-follow-up then

## 2019-12-27 NOTE — Assessment & Plan Note (Signed)
Chronic BP well controlled Continue amlodipine 5 mg daily cmp  

## 2019-12-27 NOTE — Assessment & Plan Note (Signed)
Chronic GERD controlled Continue omeprazole 20 mg daily  

## 2019-12-27 NOTE — Assessment & Plan Note (Signed)
Chronic A1c 8.7%-not controlled She has not been exercising regularly and has not been eating as well.  Stressed regular exercise and more compliance with a diabetic diet Encouraged weight loss Increase Jardiance to 25 mg daily Continue Ozempic 1 mg weekly-she did miss a couple of doses and that may be contributing as well Follow-up in 6 months

## 2019-12-27 NOTE — Assessment & Plan Note (Signed)
Chronic Related to her back Continue gabapentin 300 mg in the morning, 300 mg in the afternoon and 1200 mg at bedtime

## 2019-12-27 NOTE — Assessment & Plan Note (Signed)
Chronic Check lipid panel  Continue Crestor 5 mg daily Regular exercise and healthy diet encouraged  

## 2019-12-29 ENCOUNTER — Other Ambulatory Visit: Payer: Self-pay | Admitting: Internal Medicine

## 2020-01-01 ENCOUNTER — Encounter: Payer: Self-pay | Admitting: Internal Medicine

## 2020-01-02 MED ORDER — MELOXICAM 15 MG PO TABS
15.0000 mg | ORAL_TABLET | Freq: Every day | ORAL | 0 refills | Status: DC
Start: 2020-01-02 — End: 2021-01-09

## 2020-01-17 ENCOUNTER — Other Ambulatory Visit: Payer: Self-pay | Admitting: Internal Medicine

## 2020-04-22 ENCOUNTER — Other Ambulatory Visit: Payer: Self-pay | Admitting: Internal Medicine

## 2020-04-24 ENCOUNTER — Encounter: Payer: Self-pay | Admitting: Internal Medicine

## 2020-04-25 MED ORDER — EMPAGLIFLOZIN 25 MG PO TABS
25.0000 mg | ORAL_TABLET | Freq: Every day | ORAL | 2 refills | Status: DC
Start: 2020-04-25 — End: 2021-01-30

## 2020-04-25 MED ORDER — EMPAGLIFLOZIN 25 MG PO TABS
25.0000 mg | ORAL_TABLET | Freq: Every day | ORAL | 2 refills | Status: DC
Start: 2020-04-25 — End: 2020-04-25

## 2020-06-19 ENCOUNTER — Other Ambulatory Visit: Payer: Self-pay

## 2020-06-19 ENCOUNTER — Other Ambulatory Visit (INDEPENDENT_AMBULATORY_CARE_PROVIDER_SITE_OTHER): Payer: 59

## 2020-06-19 DIAGNOSIS — E1165 Type 2 diabetes mellitus with hyperglycemia: Secondary | ICD-10-CM

## 2020-06-19 DIAGNOSIS — I10 Essential (primary) hypertension: Secondary | ICD-10-CM

## 2020-06-19 DIAGNOSIS — Z794 Long term (current) use of insulin: Secondary | ICD-10-CM | POA: Diagnosis not present

## 2020-06-19 DIAGNOSIS — E782 Mixed hyperlipidemia: Secondary | ICD-10-CM | POA: Diagnosis not present

## 2020-06-19 LAB — LIPID PANEL
Cholesterol: 163 mg/dL (ref 0–200)
HDL: 44.3 mg/dL (ref >39.00)
LDL Cholesterol: 83 mg/dL (ref 0–99)
NonHDL: 119.18
Total CHOL/HDL Ratio: 4
Triglycerides: 181 mg/dL — ABNORMAL HIGH (ref 0.0–149.0)
VLDL: 36.2 mg/dL (ref 0.0–40.0)

## 2020-06-19 LAB — COMPREHENSIVE METABOLIC PANEL
ALT: 17 U/L (ref 0–35)
AST: 14 U/L (ref 0–37)
Albumin: 3.9 g/dL (ref 3.5–5.2)
Alkaline Phosphatase: 96 U/L (ref 39–117)
BUN: 12 mg/dL (ref 6–23)
CO2: 26 mEq/L (ref 19–32)
Calcium: 9.5 mg/dL (ref 8.4–10.5)
Chloride: 104 mEq/L (ref 96–112)
Creatinine, Ser: 0.61 mg/dL (ref 0.40–1.20)
GFR: 94.38 mL/min (ref >60.00)
Glucose, Bld: 156 mg/dL — ABNORMAL HIGH (ref 70–99)
Potassium: 4 mEq/L (ref 3.5–5.1)
Sodium: 140 mEq/L (ref 135–145)
Total Bilirubin: 0.5 mg/dL (ref 0.2–1.2)
Total Protein: 7.3 g/dL (ref 6.0–8.3)

## 2020-06-19 LAB — CBC WITH DIFFERENTIAL/PLATELET
Basophils Absolute: 0.1 10*3/uL (ref 0.0–0.1)
Basophils Relative: 1 % (ref 0.0–3.0)
Eosinophils Absolute: 0.2 10*3/uL (ref 0.0–0.7)
Eosinophils Relative: 3.1 % (ref 0.0–5.0)
HCT: 44.6 % (ref 36.0–46.0)
Hemoglobin: 14.8 g/dL (ref 12.0–15.0)
Lymphocytes Relative: 32.7 % (ref 12.0–46.0)
Lymphs Abs: 2.4 10*3/uL (ref 0.7–4.0)
MCHC: 33.1 g/dL (ref 30.0–36.0)
MCV: 86.2 fl (ref 78.0–100.0)
Monocytes Absolute: 0.5 10*3/uL (ref 0.1–1.0)
Monocytes Relative: 6.7 % (ref 3.0–12.0)
Neutro Abs: 4.2 10*3/uL (ref 1.4–7.7)
Neutrophils Relative %: 56.5 % (ref 43.0–77.0)
Platelets: 250 10*3/uL (ref 150.0–400.0)
RBC: 5.17 Mil/uL — ABNORMAL HIGH (ref 3.87–5.11)
RDW: 14.1 % (ref 11.5–15.5)
WBC: 7.4 10*3/uL (ref 4.0–10.5)

## 2020-06-19 LAB — HEMOGLOBIN A1C: Hgb A1c MFr Bld: 8.3 % — ABNORMAL HIGH (ref 4.6–6.5)

## 2020-06-19 LAB — TSH: TSH: 2.13 u[IU]/mL (ref 0.35–4.50)

## 2020-06-21 ENCOUNTER — Other Ambulatory Visit: Payer: Self-pay

## 2020-06-23 NOTE — Progress Notes (Signed)
Subjective:    [name], female    DOB: November 11, 1955, 65 y.o.   MRN: [id number]   This visit occurred during the SARS-CoV-2 public health emergency.  Safety protocols were in place, including screening questions prior to the visit, additional usage of staff PPE, and extensive cleaning of exam room while observing appropriate contact time as indicated for disinfecting solutions.    HPI She is here for a physical exam.   She has no major concerns.  Medications and allergies reviewed with patient and updated if appropriate.  Patient Active Problem List   Diagnosis Date Noted  . BMI 40.0-44.9, adult ([facility]) 12/27/2019  . Hyperlipidemia 06/27/2019  . Neuropathy 10/25/2017  . 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 Rib pain on left side 04/13/2016  . Diabetes ([facility]) 12/31/2015  . Chest pain 12/31/2015  . OSA (obstructive sleep apnea) 03/28/2014  . Lumbar and sacral osteoarthritis 10/20/2012  . Lumbar canal stenosis 10/20/2012  . Morbid obesity ([facility])   . Allergic rhinitis   . Osteoarthritis, knee 02/18/2011  . Hypertension   . Migraines   . GERD (gastroesophageal reflux disease)     Current Outpatient Medications on File Prior to Visit  Medication Sig Dispense Refill  . amitriptyline (ELAVIL) 25 MG tablet TAKE 1 TABLET BY MOUTH AT  BEDTIME 90 tablet 3  . amLODipine (NORVASC) 5 MG tablet TAKE 1 TABLET BY MOUTH  DAILY 90 tablet 3  . blood glucose meter kit and supplies KIT Dispense based on patient and insurance preference. Use up to four times daily as directed. (FOR E11.9). 1 each 0  . cyclobenzaprine (FLEXERIL) 10 MG tablet TAKE 1 TABLET BY MOUTH 3  TIMES DAILY AS NEEDED 60 tablet 5  . empagliflozin (JARDIANCE) 25 MG TABS tablet Take 1 tablet (25 mg total) by mouth daily before breakfast. 90 tablet 2  . gabapentin (NEURONTIN) 300 MG capsule TAKE 1 CAPSULE BY MOUTH IN  THE MORNING , 1 CAPSULE IN  THE AFTERNOON , AND 4  CAPSULES AT BEDTIME 540 capsule 3  . meloxicam (MOBIC) 15 MG  tablet Take 1 tablet (15 mg total) by mouth daily. 7 tablet 0  . omeprazole (PRILOSEC) 20 MG capsule TAKE 1 CAPSULE BY MOUTH  DAILY 90 capsule 3  . OZEMPIC, 1 MG/DOSE, 4 MG/3ML SOPN     . rosuvastatin (CRESTOR) 5 MG tablet Take 1 tablet (5 mg total) by mouth daily. 90 tablet 3   No current facility-administered medications on file prior to visit.    Past Medical History:  Diagnosis Date  . Allergic rhinitis   . Allergy   . Arthritis     knee s/p TKR 2/13  . Depression   . GERD (gastroesophageal reflux disease)   . Hypertension   . Migraines   . OSA (obstructive sleep apnea)    uses cpap nightly  . Shingles outbreak 12/2013   L arm/torso  . Sleep apnea    cpap  . Spinal stenosis     Past Surgical History:  Procedure Laterality Date  . Knee replacement Left 2012  . KNEE SURGERY     both knees  . TOTAL KNEE ARTHROPLASTY  04/13/2011   Procedure: TOTAL KNEE ARTHROPLASTY;  Surgeon: Loanne Drilling, MD;  Location: [facility];  Service: Orthopedics;  Laterality: Left;    Social History   Socioeconomic History  . Marital status: Single    Spouse name: Not on file  . Number of children: Not on file  . Years of  education: Not on file  . Highest education level: Not on file  Occupational History  . Occupation: retired  Tobacco Use  . Smoking status: Never Smoker  . Smokeless tobacco: Never Used  Substance and Sexual Activity  . Alcohol use: Yes    Alcohol/week: 1.0 standard drink    Types: 1 Glasses of wine per week    Comment: rare social  . Drug use: No  . Sexual activity: Yes  Other Topics Concern  . Not on file  Social History Narrative  . Not on file   Social Determinants of Health   Financial Resource Strain: Not on file  Food Insecurity: Not on file  Transportation Needs: Not on file  Physical Activity: Not on file  Stress: Not on file  Social Connections: Not on file    Family History  Problem Relation Age of Onset  . Arthritis Mother   . Breast cancer  Mother   . Emphysema Mother   . Asthma Mother   . Heart disease Mother   . Arthritis Father   . Cancer Father   . Alcohol abuse Other   . Diabetes Other   . Colon cancer Neg Hx     Review of Systems  Constitutional: Negative for chills and fever.  Eyes: Negative for visual disturbance.  Respiratory: Negative for cough, shortness of breath and wheezing.   Cardiovascular: Negative for chest pain, palpitations and leg swelling.  Gastrointestinal: Negative for abdominal pain, blood in stool, constipation, diarrhea and nausea.       Gerd controlled  Genitourinary: Negative for dysuria.  Musculoskeletal: Positive for back pain (intermittent, depending on activity - flexeril prn). Negative for arthralgias.  Skin: Negative for rash.       Lump in lower right breast - has gotten smaller, mobile  Neurological: Negative for light-headedness and headaches.  Psychiatric/Behavioral: Negative for dysphoric mood. The patient is not nervous/anxious.        Objective:   Vitals:   06/24/20 1307  BP: 130/76  Pulse: 99  Temp: 98.2 F (36.8 C)  SpO2: 97%   Filed Weights   06/24/20 1307  Weight: 269 lb (122 kg)   Body mass index is 40.9 kg/m.  BP Readings from Last 3 Encounters:  06/24/20 130/76  12/27/19 120/76  06/27/19 132/84    Wt Readings from Last 3 Encounters:  06/24/20 269 lb (122 kg)  12/27/19 267 lb (121.1 kg)  06/27/19 263 lb 3.2 oz (119.4 kg)     Physical Exam Constitutional: She appears well-developed and well-nourished. No distress.  HENT:  Head: Normocephalic and atraumatic.  Right Ear: External ear normal. Normal ear canal and TM Left Ear: External ear normal.  Normal ear canal and TM Mouth/Throat: Oropharynx is clear and moist.  Eyes: Conjunctivae and EOM are normal.  Neck: Neck supple. No tracheal deviation present. No thyromegaly present.  No carotid bruit  Cardiovascular: Normal rate, regular rhythm and normal heart sounds.   No murmur heard.  No  edema. Pulmonary/Chest: Effort normal and breath sounds normal. No respiratory distress. She has no wheezes. She has no rales.  Breast: deferred   Abdominal: Soft. She exhibits no distension. There is no tenderness.  Lymphadenopathy: She has no cervical adenopathy.  Skin: Skin is warm and dry. She is not diaphoretic.  Psychiatric: She has a normal mood and affect. Her behavior is normal.    Lab Results  Component Value Date   WBC 7.4 06/19/2020   HGB 14.8 06/19/2020   HCT 44.6  06/19/2020   PLT 250.0 06/19/2020   GLUCOSE 156 (H) 06/19/2020   CHOL 163 06/19/2020   TRIG 181.0 (H) 06/19/2020   HDL 44.30 06/19/2020   LDLDIRECT 134.0 10/21/2017   LDLCALC 83 06/19/2020   ALT 17 06/19/2020   AST 14 06/19/2020   NA 140 06/19/2020   K 4.0 06/19/2020   CL 104 06/19/2020   CREATININE 0.61 06/19/2020   BUN 12 06/19/2020   CO2 26 06/19/2020   TSH 2.13 06/19/2020   INR 1.00 04/07/2011   HGBA1C 8.3 (H) 06/19/2020   MICROALBUR <0.7 12/20/2019         Assessment & Plan:   Physical exam: Screening blood work    reviewed Immunizations  Discussed covid vaccines, shingrix Colonoscopy   Up to date  Mammogram  Due - will schedule Gyn  Due - will schedule Eye exams  Due - will schedule Exercise  None- will start walking Weight   Encouraged weight loss Substance abuse  none      See Problem List for Assessment and Plan of chronic medical problems.

## 2020-06-23 NOTE — Patient Instructions (Addendum)
Blood work was reviewed.     Medications changes include :  Start glimepiride 2 mg daily and topamax 25 mg 1-2 times daily   Your prescription(s) have been submitted to your pharmacy. Please take as directed and contact our office if you believe you are having problem(s) with the medication(s).    Please followup in 4 months    Health Maintenance, Female Adopting a healthy lifestyle and getting preventive care are important in promoting health and wellness. Ask your health care provider about:  The right schedule for you to have regular tests and exams.  Things you can do on your own to prevent diseases and keep yourself healthy. What should I know about diet, weight, and exercise? Eat a healthy diet  Eat a diet that includes plenty of vegetables, fruits, low-fat dairy products, and lean protein.  Do not eat a lot of foods that are high in solid fats, added sugars, or sodium.   Maintain a healthy weight Body mass index (BMI) is used to identify weight problems. It estimates body fat based on height and weight. Your health care provider can help determine your BMI and help you achieve or maintain a healthy weight. Get regular exercise Get regular exercise. This is one of the most important things you can do for your health. Most adults should:  Exercise for at least 150 minutes each week. The exercise should increase your heart rate and make you sweat (moderate-intensity exercise).  Do strengthening exercises at least twice a week. This is in addition to the moderate-intensity exercise.  Spend less time sitting. Even light physical activity can be beneficial. Watch cholesterol and blood lipids Have your blood tested for lipids and cholesterol at 65 years of age, then have this test every 5 years. Have your cholesterol levels checked more often if:  Your lipid or cholesterol levels are high.  You are older than 65 years of age.  You are at high risk for heart disease. What  should I know about cancer screening? Depending on your health history and family history, you may need to have cancer screening at various ages. This may include screening for:  Breast cancer.  Cervical cancer.  Colorectal cancer.  Skin cancer.  Lung cancer. What should I know about heart disease, diabetes, and high blood pressure? Blood pressure and heart disease  High blood pressure causes heart disease and increases the risk of stroke. This is more likely to develop in people who have high blood pressure readings, are of African descent, or are overweight.  Have your blood pressure checked: ? Every 3-5 years if you are 35-65 years of age. ? Every year if you are 85 years old or older. Diabetes Have regular diabetes screenings. This checks your fasting blood sugar level. Have the screening done:  Once every three years after age 58 if you are at a normal weight and have a low risk for diabetes.  More often and at a younger age if you are overweight or have a high risk for diabetes. What should I know about preventing infection? Hepatitis B If you have a higher risk for hepatitis B, you should be screened for this virus. Talk with your health care provider to find out if you are at risk for hepatitis B infection. Hepatitis C Testing is recommended for:  Everyone born from [birth year] through 1965.  Anyone with known risk factors for hepatitis C. Sexually transmitted infections (STIs)  Get screened for STIs, including gonorrhea and chlamydia, if: ?  You are sexually active and are younger than 65 years of age. ? You are older than 65 years of age and your health care provider tells you that you are at risk for this type of infection. ? Your sexual activity has changed since you were last screened, and you are at increased risk for chlamydia or gonorrhea. Ask your health care provider if you are at risk.  Ask your health care provider about whether you are at high risk for HIV. Your  health care provider may recommend a prescription medicine to help prevent HIV infection. If you choose to take medicine to prevent HIV, you should first get tested for HIV. You should then be tested every 3 months for as long as you are taking the medicine. Pregnancy  If you are about to stop having your period (premenopausal) and you may become pregnant, seek counseling before you get pregnant.  Take 400 to 800 micrograms (mcg) of folic acid every day if you become pregnant.  Ask for birth control (contraception) if you want to prevent pregnancy. Osteoporosis and menopause Osteoporosis is a disease in which the bones lose minerals and strength with aging. This can result in bone fractures. If you are 65 years old or older, or if you are at risk for osteoporosis and fractures, ask your health care provider if you should:  Be screened for bone loss.  Take a calcium or vitamin D supplement to lower your risk of fractures.  Be given hormone replacement therapy (HRT) to treat symptoms of menopause. Follow these instructions at home: Lifestyle  Do not use any products that contain nicotine or tobacco, such as cigarettes, e-cigarettes, and chewing tobacco. If you need help quitting, ask your health care provider.  Do not use street drugs.  Do not share needles.  Ask your health care provider for help if you need support or information about quitting drugs. Alcohol use  Do not drink alcohol if: ? Your health care provider tells you not to drink. ? You are pregnant, may be pregnant, or are planning to become pregnant.  If you drink alcohol: ? Limit how much you use to 0-1 drink a day. ? Limit intake if you are breastfeeding.  Be aware of how much alcohol is in your drink. In the [place] one drink equals one 12 oz bottle of beer (355 mL), one 5 oz glass of wine (148 mL), or one 1 oz glass of hard liquor (44 mL). General instructions  Schedule regular health, dental, and eye  exams.  Stay current with your vaccines.  Tell your health care provider if: ? You often feel depressed. ? You have ever been abused or do not feel safe at home. Summary  Adopting a healthy lifestyle and getting preventive care are important in promoting health and wellness.  Follow your health care provider's instructions about healthy diet, exercising, and getting tested or screened for diseases.  Follow your health care provider's instructions on monitoring your cholesterol and blood pressure. This information is not intended to replace advice given to you by your health care provider. Make sure you discuss any questions you have with your health care provider. Document Revised: 02/09/2018 Document Reviewed: 02/09/2018 Elsevier Patient Education  2021 [employer].

## 2020-06-24 ENCOUNTER — Ambulatory Visit (INDEPENDENT_AMBULATORY_CARE_PROVIDER_SITE_OTHER): Payer: 59 | Admitting: Internal Medicine

## 2020-06-24 ENCOUNTER — Other Ambulatory Visit: Payer: Self-pay

## 2020-06-24 ENCOUNTER — Encounter: Payer: Self-pay | Admitting: Internal Medicine

## 2020-06-24 VITALS — BP 130/76 | HR 99 | Temp 98.2°F | Ht 68.0 in | Wt 269.0 lb

## 2020-06-24 DIAGNOSIS — K219 Gastro-esophageal reflux disease without esophagitis: Secondary | ICD-10-CM | POA: Diagnosis not present

## 2020-06-24 DIAGNOSIS — Z6841 Body Mass Index (BMI) 40.0 and over, adult: Secondary | ICD-10-CM

## 2020-06-24 DIAGNOSIS — E782 Mixed hyperlipidemia: Secondary | ICD-10-CM

## 2020-06-24 DIAGNOSIS — I10 Essential (primary) hypertension: Secondary | ICD-10-CM | POA: Diagnosis not present

## 2020-06-24 DIAGNOSIS — Z0000 Encounter for general adult medical examination without abnormal findings: Secondary | ICD-10-CM | POA: Diagnosis not present

## 2020-06-24 DIAGNOSIS — G43009 Migraine without aura, not intractable, without status migrainosus: Secondary | ICD-10-CM

## 2020-06-24 DIAGNOSIS — E1165 Type 2 diabetes mellitus with hyperglycemia: Secondary | ICD-10-CM

## 2020-06-24 DIAGNOSIS — Z794 Long term (current) use of insulin: Secondary | ICD-10-CM

## 2020-06-24 DIAGNOSIS — G4733 Obstructive sleep apnea (adult) (pediatric): Secondary | ICD-10-CM | POA: Diagnosis not present

## 2020-06-24 DIAGNOSIS — G629 Polyneuropathy, unspecified: Secondary | ICD-10-CM

## 2020-06-24 MED ORDER — CYCLOBENZAPRINE HCL 10 MG PO TABS: 1.0000 | ORAL_TABLET | Freq: Three times a day (TID) | ORAL | 5 refills | Status: DC | PRN

## 2020-06-24 MED ORDER — GLIMEPIRIDE 2 MG PO TABS
2.0000 mg | ORAL_TABLET | Freq: Every day | ORAL | 3 refills | Status: DC
Start: 2020-06-24 — End: 2021-04-28

## 2020-06-24 MED ORDER — TOPIRAMATE 25 MG PO TABS: 25.0000 mg | ORAL_TABLET | Freq: Two times a day (BID) | ORAL | 5 refills | Status: DC

## 2020-06-24 NOTE — Assessment & Plan Note (Signed)
Chronic Cholesterol well controlled Continue rosuvastatin 5 mg daily Regular exercise and healthy diet encouraged

## 2020-06-24 NOTE — Assessment & Plan Note (Addendum)
Chronic BP well controlled Continue amlodipine 5 mg qd CMP reviewed-kidney function and potassium normal

## 2020-06-24 NOTE — Assessment & Plan Note (Signed)
Chronic Related to her back Continue gabapentin 300 mg in the morning, 300 mg in the afternoon and 1200 mg at bedtime 

## 2020-06-24 NOTE — Assessment & Plan Note (Signed)
Chronic A1c 8.3%-not controlled Continue Ozempic 1 mg weekly-she admits she has not been taking this consistently every week and is stressed compliance Continue Jardiance 25 mg daily Start glimepiride 2 mg daily-hope for this to be short-term medication Stressed getting back to regular exercise Stressed weight loss Stressed diabetic diet, decrease portions

## 2020-06-24 NOTE — Assessment & Plan Note (Signed)
Chronic GERD controlled Continue omeprazole 20 mg daily  

## 2020-06-24 NOTE — Assessment & Plan Note (Signed)
Chronic She understands the importance of weight loss She will try to start walking again Avoid snacking, decrease portions Will start Topamax and hopefully that will help-25 mg once-twice daily if tolerated-we will titrate

## 2020-06-24 NOTE — Assessment & Plan Note (Addendum)
Chronic 1-2 times a month Usually takes something natural Discussed trying Topamax to see if that helps and she would like to try that-May also help with her cravings Start 25 mg once-twice daily-we will try to titrate if tolerated.  Discussed possible side effects

## 2020-06-24 NOTE — Assessment & Plan Note (Signed)
Chronic Using cpap nightly 

## 2020-06-28 ENCOUNTER — Encounter: Payer: Self-pay | Admitting: Internal Medicine

## 2020-07-03 ENCOUNTER — Other Ambulatory Visit: Payer: Self-pay

## 2020-07-05 NOTE — Telephone Encounter (Signed)
Pharmacy notified.

## 2020-09-17 ENCOUNTER — Other Ambulatory Visit: Payer: Self-pay | Admitting: Internal Medicine

## 2020-09-23 ENCOUNTER — Other Ambulatory Visit: Payer: Self-pay | Admitting: Internal Medicine

## 2020-10-08 ENCOUNTER — Other Ambulatory Visit: Payer: Self-pay | Admitting: Internal Medicine

## 2020-10-24 NOTE — Progress Notes (Signed)
Subjective:    [name], female    DOB: 04/26/1955, 65 y.o.   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  She does have chronic back issues.    She is drinking a lot of fluids.  She is taking magnesium..   She just started walking this week - trying to walk 10 min every hour.     Medications and allergies reviewed with patient and updated if appropriate.  Patient Active Problem List   Diagnosis Date Noted   Morbid obesity with BMI of 40.0-44.9, adult ([place]) 12/27/2019   Hyperlipidemia 06/27/2019   Neuropathy 10/25/2017   Lipoma 08/23/2017   Rib pain on left side 04/13/2016   Diabetes ([place]) 12/31/2015   OSA (obstructive sleep apnea) 03/28/2014   Lumbar and sacral osteoarthritis 10/20/2012   Lumbar canal stenosis 10/20/2012   Allergic rhinitis    Osteoarthritis, knee 02/18/2011   Hypertension    Migraines    GERD (gastroesophageal reflux disease)     Current Outpatient Medications on File Prior to Visit  Medication Sig Dispense Refill   amitriptyline (ELAVIL) 25 MG tablet TAKE 1 TABLET BY MOUTH AT  BEDTIME 90 tablet 3   amLODipine (NORVASC) 5 MG tablet TAKE 1 TABLET BY MOUTH  DAILY 90 tablet 3   blood glucose meter kit and supplies KIT Dispense based on patient and insurance preference. Use up to four times daily as directed. (FOR E11.9). 1 each 0   cyclobenzaprine (FLEXERIL) 10 MG tablet Take 1 tablet (10 mg total) by mouth 3 (three) times daily as needed. 60 tablet 5   empagliflozin (JARDIANCE) 25 MG TABS tablet Take 1 tablet (25 mg total) by mouth daily before breakfast. 90 tablet 2   gabapentin (NEURONTIN) 300 MG capsule TAKE 1 CAPSULE BY MOUTH IN  THE MORNING , 1 CAPSULE IN  THE AFTERNOON , AND 4  CAPSULES AT BEDTIME 540 capsule 3   glimepiride (AMARYL) 2 MG tablet Take 1 tablet (2  mg total) by mouth daily before breakfast. 30 tablet 3   meloxicam (MOBIC) 15 MG tablet Take 1 tablet (15 mg total) by mouth daily. 7 tablet 0   omeprazole (PRILOSEC) 20 MG capsule TAKE 1 CAPSULE BY MOUTH  DAILY 90 capsule 3   OZEMPIC, 1 MG/DOSE, 4 MG/3ML SOPN INJECT SUBCUTANEOUSLY 1MG   WEEKLY 9 mL 3   rosuvastatin (CRESTOR) 5 MG tablet Take 1 tablet (5 mg total) by mouth daily. 90 tablet 3   topiramate (TOPAMAX) 25 MG tablet Take 1 tablet (25 mg total) by mouth 2 (two) times daily. 60 tablet 5   No current facility-administered medications on file prior to visit.    Past Medical History:  Diagnosis Date   Allergic rhinitis    Allergy    Arthritis     knee s/p TKR 2/13   Depression    GERD (gastroesophageal reflux disease)    Hypertension    Migraines    OSA (obstructive sleep apnea)    uses cpap nightly   Shingles outbreak 12/2013   L arm/torso   Sleep apnea    cpap   Spinal stenosis     Past Surgical History:  Procedure Laterality Date   Knee replacement Left 2012   KNEE SURGERY     both knees   TOTAL KNEE ARTHROPLASTY  04/13/2011  Procedure: TOTAL KNEE ARTHROPLASTY;  Surgeon: Gearlean Alf, MD;  Location: [facility];  Service: Orthopedics;  Laterality: Left;    Social History   Socioeconomic History   Marital status: Single    Spouse name: Not on file   Number of children: Not on file   Years of education: Not on file   Highest education level: Not on file  Occupational History   Occupation: retired  Tobacco Use   Smoking status: Never   Smokeless tobacco: Never  Substance and Sexual Activity   Alcohol use: Yes    Alcohol/week: 1.0 standard drink    Types: 1 Glasses of wine per week    Comment: rare social   Drug use: No   Sexual activity: Yes  Other Topics Concern   Not on file  Social History Narrative   Not on file   Social Determinants of Health   Financial Resource Strain: Not on file  Food Insecurity: Not on file  Transportation Needs: Not on  file  Physical Activity: Not on file  Stress: Not on file  Social Connections: Not on file    Family History  Problem Relation Age of Onset   Arthritis Mother    Breast cancer Mother    Emphysema Mother    Asthma Mother    Heart disease Mother    Arthritis Father    Cancer Father    Alcohol abuse Other    Diabetes Other    Colon cancer Neg Hx     Review of Systems  Constitutional:  Negative for chills and fever.  Respiratory:  Negative for cough, shortness of breath and wheezing.   Cardiovascular:  Negative for chest pain, palpitations and leg swelling.  Musculoskeletal:        Leg cramps  Neurological:  Positive for light-headedness (occ) and headaches (occ migraines).      Objective:   Vitals:   10/25/20 1418  BP: 120/88  Pulse: 100  Temp: 98.2 F (36.8 C)  SpO2: 98%   BP Readings from Last 3 Encounters:  10/25/20 120/88  06/24/20 130/76  12/27/19 120/76   Wt Readings from Last 3 Encounters:  10/25/20 278 lb (126.1 kg)  06/24/20 269 lb (122 kg)  12/27/19 267 lb (121.1 kg)   Body mass index is 42.27 kg/m.   Physical Exam    Constitutional: Appears well-developed and well-nourished. No distress.  HENT:  Head: Normocephalic and atraumatic.  Neck: Neck supple. No tracheal deviation present. No thyromegaly present.  No cervical lymphadenopathy Cardiovascular: Normal rate, regular rhythm and normal heart sounds.   No murmur heard. No carotid bruit .  No edema Pulmonary/Chest: Effort normal and breath sounds normal. No respiratory distress. No has no wheezes. No rales.  Skin: Skin is warm and dry. Not diaphoretic.  Psychiatric: Normal mood and affect. Behavior is normal.      Assessment & Plan:    See Problem List for Assessment and Plan of chronic medical problems.    This visit occurred during the SARS-CoV-2 public health emergency.  Safety protocols were in place, including screening questions prior to the visit, additional usage of staff PPE,  and extensive cleaning of exam room while observing appropriate contact time as indicated for disinfecting solutions.

## 2020-10-24 NOTE — Patient Instructions (Addendum)
    Blood work was ordered.      Medications changes include :   none     Please followup in 6 months  

## 2020-10-25 ENCOUNTER — Ambulatory Visit: Payer: 59 | Admitting: Internal Medicine

## 2020-10-25 ENCOUNTER — Encounter: Payer: Self-pay | Admitting: Internal Medicine

## 2020-10-25 ENCOUNTER — Other Ambulatory Visit: Payer: Self-pay

## 2020-10-25 VITALS — BP 120/88 | HR 100 | Temp 98.2°F | Ht 68.0 in | Wt 278.0 lb

## 2020-10-25 DIAGNOSIS — G43009 Migraine without aura, not intractable, without status migrainosus: Secondary | ICD-10-CM

## 2020-10-25 DIAGNOSIS — I10 Essential (primary) hypertension: Secondary | ICD-10-CM

## 2020-10-25 DIAGNOSIS — Z794 Long term (current) use of insulin: Secondary | ICD-10-CM

## 2020-10-25 DIAGNOSIS — E782 Mixed hyperlipidemia: Secondary | ICD-10-CM | POA: Diagnosis not present

## 2020-10-25 DIAGNOSIS — G629 Polyneuropathy, unspecified: Secondary | ICD-10-CM

## 2020-10-25 DIAGNOSIS — E1165 Type 2 diabetes mellitus with hyperglycemia: Secondary | ICD-10-CM | POA: Diagnosis not present

## 2020-10-25 DIAGNOSIS — Z6841 Body Mass Index (BMI) 40.0 and over, adult: Secondary | ICD-10-CM

## 2020-10-25 LAB — COMPREHENSIVE METABOLIC PANEL
ALT: 16 U/L (ref 0–35)
AST: 15 U/L (ref 0–37)
Albumin: 4.3 g/dL (ref 3.5–5.2)
Alkaline Phosphatase: 110 U/L (ref 39–117)
BUN: 16 mg/dL (ref 6–23)
CO2: 25 mEq/L (ref 19–32)
Calcium: 9.6 mg/dL (ref 8.4–10.5)
Chloride: 102 mEq/L (ref 96–112)
Creatinine, Ser: 0.73 mg/dL (ref 0.40–1.20)
GFR: 86.6 mL/min (ref >60.00)
Glucose, Bld: 139 mg/dL — ABNORMAL HIGH (ref 70–99)
Potassium: 4.1 mEq/L (ref 3.5–5.1)
Sodium: 139 mEq/L (ref 135–145)
Total Bilirubin: 0.4 mg/dL (ref 0.2–1.2)
Total Protein: 8.1 g/dL (ref 6.0–8.3)

## 2020-10-25 LAB — HEMOGLOBIN A1C: Hgb A1c MFr Bld: 6.6 % — ABNORMAL HIGH (ref 4.6–6.5)

## 2020-10-25 NOTE — Assessment & Plan Note (Signed)
Chronic Check lipid panel  Continue crestor 5 mg qd Regular exercise and healthy diet encouraged

## 2020-10-25 NOTE — Assessment & Plan Note (Signed)
Chronic BP well controlled Continue amlodipine 5 mg qd cmp

## 2020-10-25 NOTE — Assessment & Plan Note (Signed)
Chronic Check a1c Continue jardiance 25 mg qd, glimepiride 2 mg qd, ozempic 1 mg weekly stressed regular exercise and weight loss diabetic diet

## 2020-10-25 NOTE — Assessment & Plan Note (Signed)
Chronic Stressed regular exercise, dec portions and weight loss Continue topirimate 25 mg bid

## 2020-10-25 NOTE — Assessment & Plan Note (Addendum)
Chronic related to her back Continue gabapentin 300 mg am, 300 mg afternoon, 1200 mg HS, amitriptyline 25 mg HS

## 2020-10-25 NOTE — Assessment & Plan Note (Signed)
Chronic Occasional migraines Continue topamax 25 mg bid

## 2021-01-08 ENCOUNTER — Encounter: Payer: Self-pay | Admitting: Internal Medicine

## 2021-01-09 MED ORDER — MELOXICAM 15 MG PO TABS: 15.0000 mg | ORAL_TABLET | Freq: Every day | ORAL | 1 refills | Status: DC

## 2021-01-09 MED ORDER — AMITRIPTYLINE HCL 25 MG PO TABS: 25.0000 mg | ORAL_TABLET | Freq: Every day | ORAL | 2 refills | Status: DC

## 2021-01-30 ENCOUNTER — Encounter: Payer: Self-pay | Admitting: Internal Medicine

## 2021-01-30 ENCOUNTER — Other Ambulatory Visit: Payer: Self-pay

## 2021-01-30 MED ORDER — EMPAGLIFLOZIN 25 MG PO TABS: 25.0000 mg | ORAL_TABLET | Freq: Every day | ORAL | 2 refills | Status: DC

## 2021-04-27 ENCOUNTER — Encounter: Payer: Self-pay | Admitting: Internal Medicine

## 2021-04-27 NOTE — Progress Notes (Signed)
Subjective:    [name], female    DOB: [birth year], 66 y.o.   MRN: [id number]  This visit occurred during the SARS-CoV-2 public health emergency.  Safety protocols were in place, including screening questions prior to the visit, additional usage of staff PPE, and extensive cleaning of exam room while observing appropriate contact time as indicated for disinfecting solutions.     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  She is eating well.    She still has leg cramps.  She still has neuropathy pain.  Medications and allergies reviewed with patient and updated if appropriate.  Patient Active Problem List   Diagnosis Date Noted   Morbid obesity with BMI of 40.0-44.9, adult ([place]) 12/27/2019   Hyperlipidemia 06/27/2019   Neuropathy 10/25/2017   Lipoma 08/23/2017   Rib pain on left side 04/13/2016   Diabetes ([place]) 12/31/2015   OSA (obstructive sleep apnea) 03/28/2014   Lumbar and sacral osteoarthritis 10/20/2012   Lumbar canal stenosis 10/20/2012   Allergic rhinitis    Osteoarthritis, knee 02/18/2011   Hypertension    Migraines    GERD (gastroesophageal reflux disease)     Current Outpatient Medications on File Prior to Visit  Medication Sig Dispense Refill   amitriptyline (ELAVIL) 25 MG tablet Take 1 tablet (25 mg total) by mouth at bedtime. 90 tablet 2   amLODipine (NORVASC) 5 MG tablet TAKE 1 TABLET BY MOUTH  DAILY 90 tablet 3   blood glucose meter kit and supplies KIT Dispense based on patient and insurance preference. Use up to four times daily as directed. (FOR E11.9). 1 each 0   cyclobenzaprine (FLEXERIL) 10 MG tablet Take 1 tablet (10 mg total) by mouth 3 (three) times daily as needed. 60 tablet 5   empagliflozin (JARDIANCE) 25 MG TABS tablet Take 1 tablet (25 mg total) by mouth daily before breakfast. 90 tablet 2    gabapentin (NEURONTIN) 300 MG capsule TAKE 1 CAPSULE BY MOUTH IN  THE MORNING , 1 CAPSULE IN  THE AFTERNOON , AND 4  CAPSULES AT BEDTIME 540 capsule 3   glimepiride (AMARYL) 2 MG tablet Take 1 tablet (2 mg total) by mouth daily before breakfast. 30 tablet 3   meloxicam (MOBIC) 15 MG tablet Take 1 tablet (15 mg total) by mouth daily. 90 tablet 1   omeprazole (PRILOSEC) 20 MG capsule TAKE 1 CAPSULE BY MOUTH  DAILY 90 capsule 3   OZEMPIC, 1 MG/DOSE, 4 MG/3ML SOPN INJECT SUBCUTANEOUSLY 1MG   WEEKLY 9 mL 3   rosuvastatin (CRESTOR) 5 MG tablet Take 1 tablet (5 mg total) by mouth daily. 90 tablet 3   topiramate (TOPAMAX) 25 MG tablet Take 1 tablet (25 mg total) by mouth 2 (two) times daily. 60 tablet 5   No current facility-administered medications on file prior to visit.    Past Medical History:  Diagnosis Date   Allergic rhinitis    Allergy    Arthritis     knee s/p TKR 2/13   Depression    GERD (gastroesophageal reflux disease)    Hypertension    Migraines    OSA (obstructive sleep apnea)    uses cpap nightly   Shingles outbreak 12/2013   L arm/torso   Sleep apnea    cpap   Spinal stenosis     Past Surgical History:  Procedure Laterality  Date   Knee replacement Left 2012   KNEE SURGERY     both knees   TOTAL KNEE ARTHROPLASTY  04/13/2011   Procedure: TOTAL KNEE ARTHROPLASTY;  Surgeon: Loanne Drilling, MD;  Location: [facility];  Service: Orthopedics;  Laterality: Left;    Social History   Socioeconomic History   Marital status: Single    Spouse name: Not on file   Number of children: Not on file   Years of education: Not on file   Highest education level: Not on file  Occupational History   Occupation: retired  Tobacco Use   Smoking status: Never   Smokeless tobacco: Never  Substance and Sexual Activity   Alcohol use: Yes    Alcohol/week: 1.0 standard drink    Types: 1 Glasses of wine per week    Comment: rare social   Drug use: No   Sexual activity: Yes  Other Topics  Concern   Not on file  Social History Narrative   Not on file   Social Determinants of Health   Financial Resource Strain: Not on file  Food Insecurity: Not on file  Transportation Needs: Not on file  Physical Activity: Not on file  Stress: Not on file  Social Connections: Not on file    Family History  Problem Relation Age of Onset   Arthritis Mother    Breast cancer Mother    Emphysema Mother    Asthma Mother    Heart disease Mother    Arthritis Father    Cancer Father    Alcohol abuse Other    Diabetes Other    Colon cancer Neg Hx     Review of Systems     Objective:   Vitals:   04/28/21 1409  BP: 130/82  Pulse: 98  Temp: 98.5 F (36.9 C)  SpO2: 99%   BP Readings from Last 3 Encounters:  04/28/21 130/82  10/25/20 120/88  06/24/20 130/76   Wt Readings from Last 3 Encounters:  04/28/21 259 lb 3.2 oz (117.6 kg)  10/25/20 278 lb (126.1 kg)  06/24/20 269 lb (122 kg)   Body mass index is 39.41 kg/m.   Physical Exam Constitutional:      General: She is not in acute distress.    Appearance: Normal appearance.  HENT:     Head: Normocephalic and atraumatic.  Eyes:     Conjunctiva/sclera: Conjunctivae normal.  Cardiovascular:     Rate and Rhythm: Normal rate and regular rhythm.     Heart sounds: Normal heart sounds. No murmur heard. Pulmonary:     Effort: Pulmonary effort is normal. No respiratory distress.     Breath sounds: Normal breath sounds. No wheezing.  Musculoskeletal:     Cervical back: Neck supple.     Right lower leg: No edema.     Left lower leg: No edema.  Lymphadenopathy:     Cervical: No cervical adenopathy.  Skin:    Findings: No rash.  Neurological:     Mental Status: She is alert. Mental status is at baseline.  Psychiatric:        Mood and Affect: Mood normal.        Behavior: Behavior normal.         Lab Results  Component Value Date   WBC 7.4 06/19/2020   HGB 14.8 06/19/2020   HCT 44.6 06/19/2020   PLT 250.0  06/19/2020   GLUCOSE 139 (H) 10/25/2020   CHOL 163 06/19/2020   TRIG 181.0 (H) 06/19/2020  HDL 44.30 06/19/2020   LDLDIRECT 134.0 10/21/2017   LDLCALC 83 06/19/2020   ALT 16 10/25/2020   AST 15 10/25/2020   NA 139 10/25/2020   K 4.1 10/25/2020   CL 102 10/25/2020   CREATININE 0.73 10/25/2020   BUN 16 10/25/2020   CO2 25 10/25/2020   TSH 2.13 06/19/2020   INR 1.00 04/07/2011   HGBA1C 6.6 (H) 10/25/2020   MICROALBUR <0.7 12/20/2019     MR LUMBAR SPINE WO CONTRAST CLINICAL DATA:  Low back pain radiating to both legs, 2-3 years duration.  EXAM: MRI LUMBAR SPINE WITHOUT CONTRAST  TECHNIQUE: Multiplanar, multisequence MR imaging of the lumbar spine was performed. No intravenous contrast was administered.  COMPARISON:  Radiography 11/30/2018. MRI 04/12/2012.  FINDINGS: Segmentation:  5 lumbar type vertebral bodies.  Alignment: Normal except for 2 mm degenerative anterolisthesis L5-S1.  Vertebrae: No fracture or significant primary bone lesion. Insignificant small hemangioma in the posterosuperior right L3 vertebral body.  Conus medullaris and cauda equina: Conus extends to the L1 level. Conus and cauda equina appear normal.  Paraspinal and other soft tissues: Negative  Disc levels:  T10-11 and T11-12: Mild, non-compressive disc bulges.  T12-L1: Normal.  L1-2: Endplate osteophytes and bulging of the disc. Mild facet and ligamentous hypertrophy. Mild multifactorial spinal stenosis but without distinct neural compression.  L2-3: Endplate osteophytes and bulging of the disc. Bilateral facet and ligamentous hypertrophy. Moderate multifactorial spinal stenosis with crowding of the nerve roots but no distinct focal neural compression.  L3-4: Endplate osteophytes and bulging of the disc. Facet and ligamentous hypertrophy. Severe multifactorial spinal stenosis that could cause neural compression on either or both sides.  L4-5: Endplate osteophytes and bulging of  the disc. Facet and ligamentous hypertrophy more pronounced on the left. Bilateral lateral recess and foraminal stenosis. Foraminal narrowing is worse on the right. Lateral recess narrowing is worse on the left. Neural compression could occur at this level.  L5-S1: Facet osteoarthritis with 2 mm of anterolisthesis. Minimal bulging of the disc. No canal or foraminal stenosis.  In general, findings have worsened slightly since 2014 without a dramatic qualitative change.  IMPRESSION: 1. Severe multifactorial spinal stenosis at L3-4 that could cause neural compression on either or both sides. 2. Moderate multifactorial spinal stenosis at L2-3. 3. Bilateral lateral recess and foraminal stenosis at L4-5 that could cause neural compression. 4. Facet osteoarthritis at L5-S1 with 2 mm of anterolisthesis. No stenosis. 5. Since 2014, the findings have worsened slightly in general, without a pronounced focal or qualitative change.  Electronically Signed   By: Nelson Chimes M.D.   On: 12/26/2018 08:15   Assessment & Plan:    See Problem List for Assessment and Plan of chronic medical problems.

## 2021-04-27 NOTE — Patient Instructions (Addendum)
° ° ° °  Blood work was ordered.     Medications changes include :   none     Return in about 6 months (around 10/26/2021) for CPE.

## 2021-04-28 ENCOUNTER — Other Ambulatory Visit: Payer: Self-pay | Admitting: Internal Medicine

## 2021-04-28 ENCOUNTER — Other Ambulatory Visit: Payer: Self-pay

## 2021-04-28 ENCOUNTER — Ambulatory Visit (INDEPENDENT_AMBULATORY_CARE_PROVIDER_SITE_OTHER): Payer: Medicare Other | Admitting: Internal Medicine

## 2021-04-28 VITALS — BP 130/82 | HR 98 | Temp 98.5°F | Ht 68.0 in | Wt 259.2 lb

## 2021-04-28 DIAGNOSIS — Z794 Long term (current) use of insulin: Secondary | ICD-10-CM | POA: Diagnosis not present

## 2021-04-28 DIAGNOSIS — Z6841 Body Mass Index (BMI) 40.0 and over, adult: Secondary | ICD-10-CM

## 2021-04-28 DIAGNOSIS — E782 Mixed hyperlipidemia: Secondary | ICD-10-CM

## 2021-04-28 DIAGNOSIS — Z23 Encounter for immunization: Secondary | ICD-10-CM

## 2021-04-28 DIAGNOSIS — E1165 Type 2 diabetes mellitus with hyperglycemia: Secondary | ICD-10-CM | POA: Diagnosis not present

## 2021-04-28 DIAGNOSIS — I10 Essential (primary) hypertension: Secondary | ICD-10-CM | POA: Diagnosis not present

## 2021-04-28 DIAGNOSIS — G43009 Migraine without aura, not intractable, without status migrainosus: Secondary | ICD-10-CM

## 2021-04-28 DIAGNOSIS — G629 Polyneuropathy, unspecified: Secondary | ICD-10-CM

## 2021-04-28 DIAGNOSIS — M48061 Spinal stenosis, lumbar region without neurogenic claudication: Secondary | ICD-10-CM

## 2021-04-28 DIAGNOSIS — K219 Gastro-esophageal reflux disease without esophagitis: Secondary | ICD-10-CM

## 2021-04-28 LAB — COMPREHENSIVE METABOLIC PANEL
ALT: 22 U/L (ref 0–35)
AST: 20 U/L (ref 0–37)
Albumin: 4.7 g/dL (ref 3.5–5.2)
Alkaline Phosphatase: 90 U/L (ref 39–117)
BUN: 14 mg/dL (ref 6–23)
CO2: 30 mEq/L (ref 19–32)
Calcium: 10.4 mg/dL (ref 8.4–10.5)
Chloride: 98 mEq/L (ref 96–112)
Creatinine, Ser: 0.72 mg/dL (ref 0.40–1.20)
GFR: 87.74 mL/min (ref >60.00)
Glucose, Bld: 190 mg/dL — ABNORMAL HIGH (ref 70–99)
Potassium: 4 mEq/L (ref 3.5–5.1)
Sodium: 136 mEq/L (ref 135–145)
Total Bilirubin: 0.6 mg/dL (ref 0.2–1.2)
Total Protein: 8.5 g/dL — ABNORMAL HIGH (ref 6.0–8.3)

## 2021-04-28 LAB — CBC WITH DIFFERENTIAL/PLATELET
Basophils Absolute: 0 10*3/uL (ref 0.0–0.1)
Basophils Relative: 0.6 % (ref 0.0–3.0)
Eosinophils Absolute: 0.2 10*3/uL (ref 0.0–0.7)
Eosinophils Relative: 2.4 % (ref 0.0–5.0)
HCT: 46.4 % — ABNORMAL HIGH (ref 36.0–46.0)
Hemoglobin: 15.3 g/dL — ABNORMAL HIGH (ref 12.0–15.0)
Lymphocytes Relative: 29.4 % (ref 12.0–46.0)
Lymphs Abs: 2.5 10*3/uL (ref 0.7–4.0)
MCHC: 33 g/dL (ref 30.0–36.0)
MCV: 86 fl (ref 78.0–100.0)
Monocytes Absolute: 0.5 10*3/uL (ref 0.1–1.0)
Monocytes Relative: 5.6 % (ref 3.0–12.0)
Neutro Abs: 5.2 10*3/uL (ref 1.4–7.7)
Neutrophils Relative %: 62 % (ref 43.0–77.0)
Platelets: 306 10*3/uL (ref 150.0–400.0)
RBC: 5.4 Mil/uL — ABNORMAL HIGH (ref 3.87–5.11)
RDW: 14.2 % (ref 11.5–15.5)
WBC: 8.4 10*3/uL (ref 4.0–10.5)

## 2021-04-28 LAB — LDL CHOLESTEROL, DIRECT: Direct LDL: 168 mg/dL

## 2021-04-28 LAB — LIPID PANEL
Cholesterol: 255 mg/dL — ABNORMAL HIGH (ref 0–200)
HDL: 51.4 mg/dL (ref >39.00)
NonHDL: 203.23
Total CHOL/HDL Ratio: 5
Triglycerides: 283 mg/dL — ABNORMAL HIGH (ref 0.0–149.0)
VLDL: 56.6 mg/dL — ABNORMAL HIGH (ref 0.0–40.0)

## 2021-04-28 LAB — HEMOGLOBIN A1C: Hgb A1c MFr Bld: 11.4 % — ABNORMAL HIGH (ref 4.6–6.5)

## 2021-04-28 MED ORDER — GLIMEPIRIDE 2 MG PO TABS
2.0000 mg | ORAL_TABLET | Freq: Every day | ORAL | 3 refills | Status: DC
Start: 2021-04-28 — End: 2021-04-29

## 2021-04-28 MED ORDER — MELOXICAM 15 MG PO TABS: 15.0000 mg | ORAL_TABLET | Freq: Every day | ORAL | 1 refills | Status: DC

## 2021-04-28 NOTE — Assessment & Plan Note (Addendum)
Chronic Lab Results  Component Value Date   HGBA1C 6.6 (H) 10/25/2020   Controlled Check a1c, urine microalbumin today Diabetic diet, regular exercise stressed Continue jardiance 25 mg daily, glimepiride 2 mg daily

## 2021-04-28 NOTE — Assessment & Plan Note (Signed)
Chronic GERD controlled Continue omeprazole 20 mg daily  

## 2021-04-28 NOTE — Assessment & Plan Note (Signed)
Chronic Continue meloxicam 15 mg daily-discussed possible side effects Continue gabapentin 300 mg in the morning, 300 mg in the afternoon and 1200 mg at bedtime

## 2021-04-28 NOTE — Assessment & Plan Note (Signed)
Chronic Mostly related to prior back surgery Continue gabapentin 300 mg am, 300 mg afternoon, 1200 mg HS, amitriptyline 25 mg HS

## 2021-04-28 NOTE — Assessment & Plan Note (Addendum)
Chronic Occasional migraines Controlled Not taking topamax 25 mg bid  - will discontinue

## 2021-04-28 NOTE — Assessment & Plan Note (Addendum)
Chronic stressed regular exercise, decreased portions, diabetic diet Was on Ozempic for diabetes at 1 point and that did not help with weight loss Did not want to take Topamax for migraine/weight loss

## 2021-04-28 NOTE — Assessment & Plan Note (Signed)
Chronic BP well controlled Continue amlodipine 5 mg daily cmp  

## 2021-04-28 NOTE — Assessment & Plan Note (Signed)
Chronic Check lipid panel  Continue crestor 5 mg qd Regular exercise and healthy diet encouraged

## 2021-04-29 ENCOUNTER — Encounter: Payer: Self-pay | Admitting: Internal Medicine

## 2021-04-29 LAB — MICROALBUMIN / CREATININE URINE RATIO
Creatinine,U: 43.9 mg/dL
Microalb Creat Ratio: 1.8 mg/g (ref 0.0–30.0)
Microalb, Ur: 0.8 mg/dL (ref 0.0–1.9)

## 2021-04-30 MED ORDER — OZEMPIC (0.25 OR 0.5 MG/DOSE) 2 MG/1.5ML ~~LOC~~ SOPN: 0.5000 mg | PEN_INJECTOR | SUBCUTANEOUS | 0 refills | Status: DC

## 2021-05-29 ENCOUNTER — Other Ambulatory Visit: Payer: Self-pay | Admitting: Internal Medicine

## 2021-06-20 ENCOUNTER — Other Ambulatory Visit: Payer: Self-pay

## 2021-06-20 MED ORDER — OZEMPIC (1 MG/DOSE) 4 MG/3ML ~~LOC~~ SOPN: 1.0000 mg | PEN_INJECTOR | SUBCUTANEOUS | 1 refills | Status: DC

## 2021-06-20 NOTE — Telephone Encounter (Signed)
It is pending -- ? [facility] to [facility]?? ?

## 2021-06-25 ENCOUNTER — Encounter: Payer: Self-pay | Admitting: Internal Medicine

## 2021-07-27 ENCOUNTER — Encounter: Payer: Self-pay | Admitting: Internal Medicine

## 2021-07-27 NOTE — Progress Notes (Unsigned)
      Subjective:    [name], female    DOB: Sep 28, 1955, 66 y.o.   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 Mammo   Prescribed crestor - never took it - wanted to recheck labs  Medications and allergies reviewed with patient and updated if appropriate.  Current Outpatient Medications on File Prior to Visit  Medication Sig Dispense Refill   AMARYL 2 MG tablet TAKE 1 TABLET DAILY BEFORE BREAKFAST. 90 tablet 3   amitriptyline (ELAVIL) 25 MG tablet Take 1 tablet (25 mg total) by mouth at bedtime. 90 tablet 2   amLODipine (NORVASC) 5 MG tablet TAKE 1 TABLET BY MOUTH  DAILY 90 tablet 3   blood glucose meter kit and supplies KIT Dispense based on patient and insurance preference. Use up to four times daily as directed. (FOR E11.9). 1 each 0   cyclobenzaprine (FLEXERIL) 10 MG tablet Take 1 tablet (10 mg total) by mouth 3 (three) times daily as needed. 60 tablet 5   empagliflozin (JARDIANCE) 25 MG TABS tablet Take 1 tablet (25 mg total) by mouth daily before breakfast. 90 tablet 2   gabapentin (NEURONTIN) 300 MG capsule TAKE 1 CAPSULE BY MOUTH IN  THE MORNING , 1 CAPSULE IN  THE AFTERNOON , AND 4  CAPSULES AT BEDTIME 540 capsule 3   meloxicam (MOBIC) 15 MG tablet Take 1 tablet (15 mg total) by mouth daily. 90 tablet 1   omeprazole (PRILOSEC) 20 MG capsule TAKE 1 CAPSULE BY MOUTH  DAILY 90 capsule 3   Semaglutide, 1 MG/DOSE, (OZEMPIC, 1 MG/DOSE,) 4 MG/3ML SOPN Inject 1 mg into the skin once a week. 9 mL 1   No current facility-administered medications on file prior to visit.     Review of Systems     Objective:  There were no vitals filed for this visit. BP Readings from Last 3 Encounters:  04/28/21 130/82  10/25/20 120/88  06/24/20 130/76   Wt Readings from Last 3 Encounters:  04/28/21 259 lb 3.2 oz (117.6 kg)  10/25/20 278 lb (126.1  kg)  06/24/20 269 lb (122 kg)   There is no height or weight on file to calculate BMI.    Physical Exam     Lab Results  Component Value Date   WBC 8.4 04/28/2021   HGB 15.3 (H) 04/28/2021   HCT 46.4 (H) 04/28/2021   PLT 306.0 04/28/2021   GLUCOSE 190 (H) 04/28/2021   CHOL 255 (H) 04/28/2021   TRIG 283.0 (H) 04/28/2021   HDL 51.40 04/28/2021   LDLDIRECT 168.0 04/28/2021   LDLCALC 83 06/19/2020   ALT 22 04/28/2021   AST 20 04/28/2021   NA 136 04/28/2021   K 4.0 04/28/2021   CL 98 04/28/2021   CREATININE 0.72 04/28/2021   BUN 14 04/28/2021   CO2 30 04/28/2021   TSH 2.13 06/19/2020   INR 1.00 04/07/2011   HGBA1C 11.4 (H) 04/28/2021   MICROALBUR 0.8 04/28/2021     Assessment & Plan:    See Problem List for Assessment and Plan of chronic medical problems.

## 2021-07-27 NOTE — Patient Instructions (Addendum)
     Blood work was ordered.     Medications changes include :  zofran 4 mg  three times  as needed   Your prescription(s) have been sent to your pharmacy.    A Ct scan was ordered.    Return in about 3 months (around 10/29/2021) for follow up.

## 2021-07-29 ENCOUNTER — Telehealth: Payer: Self-pay | Admitting: Internal Medicine

## 2021-07-29 ENCOUNTER — Ambulatory Visit (INDEPENDENT_AMBULATORY_CARE_PROVIDER_SITE_OTHER): Payer: Medicare Other | Admitting: Internal Medicine

## 2021-07-29 VITALS — BP 124/72 | HR 102 | Temp 97.8°F | Ht 68.0 in | Wt 258.0 lb

## 2021-07-29 DIAGNOSIS — Z794 Long term (current) use of insulin: Secondary | ICD-10-CM | POA: Diagnosis not present

## 2021-07-29 DIAGNOSIS — E782 Mixed hyperlipidemia: Secondary | ICD-10-CM | POA: Diagnosis not present

## 2021-07-29 DIAGNOSIS — G43009 Migraine without aura, not intractable, without status migrainosus: Secondary | ICD-10-CM | POA: Diagnosis not present

## 2021-07-29 DIAGNOSIS — G629 Polyneuropathy, unspecified: Secondary | ICD-10-CM

## 2021-07-29 DIAGNOSIS — E1165 Type 2 diabetes mellitus with hyperglycemia: Secondary | ICD-10-CM | POA: Diagnosis not present

## 2021-07-29 DIAGNOSIS — I10 Essential (primary) hypertension: Secondary | ICD-10-CM | POA: Diagnosis not present

## 2021-07-29 DIAGNOSIS — R112 Nausea with vomiting, unspecified: Secondary | ICD-10-CM

## 2021-07-29 DIAGNOSIS — R1010 Upper abdominal pain, unspecified: Secondary | ICD-10-CM

## 2021-07-29 DIAGNOSIS — K219 Gastro-esophageal reflux disease without esophagitis: Secondary | ICD-10-CM

## 2021-07-29 LAB — CBC WITH DIFFERENTIAL/PLATELET
Basophils Absolute: 0.2 10*3/uL — ABNORMAL HIGH (ref 0.0–0.1)
Basophils Relative: 1 % (ref 0.0–3.0)
Eosinophils Absolute: 0.5 10*3/uL (ref 0.0–0.7)
Eosinophils Relative: 2.9 % (ref 0.0–5.0)
HCT: 45.5 % (ref 36.0–46.0)
Hemoglobin: 15.2 g/dL — ABNORMAL HIGH (ref 12.0–15.0)
Lymphocytes Relative: 15 % (ref 12.0–46.0)
Lymphs Abs: 2.6 10*3/uL (ref 0.7–4.0)
MCHC: 33.4 g/dL (ref 30.0–36.0)
MCV: 87 fl (ref 78.0–100.0)
Monocytes Absolute: 0.5 10*3/uL (ref 0.1–1.0)
Monocytes Relative: 3.2 % (ref 3.0–12.0)
Neutro Abs: 13.3 10*3/uL — ABNORMAL HIGH (ref 1.4–7.7)
Neutrophils Relative %: 77.9 % — ABNORMAL HIGH (ref 43.0–77.0)
Platelets: 342 10*3/uL (ref 150.0–400.0)
RBC: 5.22 Mil/uL — ABNORMAL HIGH (ref 3.87–5.11)
RDW: 14.5 % (ref 11.5–15.5)
WBC: 17 10*3/uL — ABNORMAL HIGH (ref 4.0–10.5)

## 2021-07-29 LAB — COMPREHENSIVE METABOLIC PANEL
ALT: 24 U/L (ref 0–35)
AST: 16 U/L (ref 0–37)
Albumin: 4.2 g/dL (ref 3.5–5.2)
Alkaline Phosphatase: 156 U/L — ABNORMAL HIGH (ref 39–117)
BUN: 18 mg/dL (ref 6–23)
CO2: 23 mEq/L (ref 19–32)
Calcium: 10.3 mg/dL (ref 8.4–10.5)
Chloride: 98 mEq/L (ref 96–112)
Creatinine, Ser: 0.64 mg/dL (ref 0.40–1.20)
GFR: 92.57 mL/min (ref >60.00)
Glucose, Bld: 115 mg/dL — ABNORMAL HIGH (ref 70–99)
Potassium: 3.7 mEq/L (ref 3.5–5.1)
Sodium: 137 mEq/L (ref 135–145)
Total Bilirubin: 1 mg/dL (ref 0.2–1.2)
Total Protein: 8.6 g/dL — ABNORMAL HIGH (ref 6.0–8.3)

## 2021-07-29 LAB — LIPID PANEL
Cholesterol: 194 mg/dL (ref 0–200)
HDL: 36.2 mg/dL — ABNORMAL LOW (ref >39.00)
LDL Cholesterol: 127 mg/dL — ABNORMAL HIGH (ref 0–99)
NonHDL: 157.34
Total CHOL/HDL Ratio: 5
Triglycerides: 154 mg/dL — ABNORMAL HIGH (ref 0.0–149.0)
VLDL: 30.8 mg/dL (ref 0.0–40.0)

## 2021-07-29 LAB — HEMOGLOBIN A1C: Hgb A1c MFr Bld: 7.1 % — ABNORMAL HIGH (ref 4.6–6.5)

## 2021-07-29 MED ORDER — ONDANSETRON HCL 4 MG PO TABS: 4.0000 mg | ORAL_TABLET | Freq: Three times a day (TID) | ORAL | 2 refills | Status: DC | PRN

## 2021-07-29 NOTE — Assessment & Plan Note (Signed)
Chronic Occasional migraines Controlled Amitriptyline 25 mg bedtime

## 2021-07-29 NOTE — Assessment & Plan Note (Signed)
Chronic BP well controlled Continue amlodipine 5 mg daily cmp  

## 2021-07-29 NOTE — Assessment & Plan Note (Signed)
Chronic Related primarily to back surgery Continue gabapentin 300 mg in the morning, 300 mg in afternoon and 1200 mg at bedtime Continue amitriptyline 25 mg at bedtime

## 2021-07-29 NOTE — Assessment & Plan Note (Signed)
Acute Since Easter time has had episodes of epigastric abdominal pain associated with nausea and vomiting Current episode started 3 days ago and she is very tender across her upper abdomen and experiencing nausea and vomiting Concern for gallbladder disease versus other GI cause No signs of GI bleed or obstruction Stat CT of the abdomen pelvis CBC, CMP-stat Further treatment depending on results

## 2021-07-29 NOTE — Telephone Encounter (Signed)
Spoke with [place] today

## 2021-07-29 NOTE — Assessment & Plan Note (Signed)
Acute Occurring and episodes with abdominal pain Concern for possible gallbladder disease She is tender on exam today and needs imaging and blood work today CT scan of the abdomen and pelvis-stat CBC, CMP stat Zofran 4 mg 3 times daily as needed Further treatment depending on above results

## 2021-07-29 NOTE — Assessment & Plan Note (Addendum)
Chronic  Lab Results  Component Value Date   HGBA1C 11.4 (H) 04/28/2021   Sugars not controlled Check A1c Continue amaryl 2 mg daily, jardiance 25 mg daily, ozempic 1 mg weekly-she did not take her last dose of Ozempic and will hold until we figure out what is causing her current abdominal pain/nausea/vomiting symptoms-May need to hold other medications we will see what blood work looks like Stressed regular exercise, diabetic diet

## 2021-07-29 NOTE — Assessment & Plan Note (Addendum)
Chronic Regular exercise and healthy diet encouraged Check lipid panel  Crestor prescribed, but she did not want to take it until her blood work was rechecked

## 2021-07-29 NOTE — Telephone Encounter (Signed)
Miriam with [facility] called - they have the stat order for patient but state with that dx they cannot do with and without contrast, it needs to be one or the other. Please call back at [phone], select option 1, then option 4 and ask for [facility].

## 2021-07-29 NOTE — Assessment & Plan Note (Signed)
Chronic GERD controlled Continue omeprazole 20 mg daily  

## 2021-07-30 ENCOUNTER — Emergency Department (HOSPITAL_COMMUNITY)
Admission: EM | Admit: 2021-07-30 | Discharge: 2021-07-30 | Disposition: A | Discharge status: Home / Self Care | Payer: Medicare Other | Attending: Emergency Medicine | Admitting: Emergency Medicine

## 2021-07-30 ENCOUNTER — Encounter (HOSPITAL_COMMUNITY): Payer: Self-pay

## 2021-07-30 ENCOUNTER — Ambulatory Visit
Admission: RE | Admit: 2021-07-30 | Discharge: 2021-07-30 | Disposition: A | Discharge status: Home / Self Care | Payer: Medicare Other | Source: Ambulatory Visit | Attending: Internal Medicine | Admitting: Internal Medicine

## 2021-07-30 ENCOUNTER — Other Ambulatory Visit: Payer: Self-pay

## 2021-07-30 DIAGNOSIS — K859 Acute pancreatitis without necrosis or infection, unspecified: Secondary | ICD-10-CM

## 2021-07-30 DIAGNOSIS — R1013 Epigastric pain: Secondary | ICD-10-CM | POA: Diagnosis present

## 2021-07-30 DIAGNOSIS — R112 Nausea with vomiting, unspecified: Secondary | ICD-10-CM

## 2021-07-30 LAB — COMPREHENSIVE METABOLIC PANEL
ALT: 20 U/L (ref 0–44)
AST: 16 U/L (ref 15–41)
Albumin: 3.9 g/dL (ref 3.5–5.0)
Alkaline Phosphatase: 128 U/L — ABNORMAL HIGH (ref 38–126)
Anion gap: 12 (ref 5–15)
BUN: 14 mg/dL (ref 8–23)
CO2: 22 mmol/L (ref 22–32)
Calcium: 9.8 mg/dL (ref 8.9–10.3)
Chloride: 103 mmol/L (ref 98–111)
Creatinine, Ser: 0.72 mg/dL (ref 0.44–1.00)
GFR, Estimated: >60 mL/min (ref >60)
Glucose, Bld: 153 mg/dL — ABNORMAL HIGH (ref 70–99)
Potassium: 3.3 mmol/L — ABNORMAL LOW (ref 3.5–5.1)
Sodium: 137 mmol/L (ref 135–145)
Total Bilirubin: 0.9 mg/dL (ref 0.3–1.2)
Total Protein: 8.8 g/dL — ABNORMAL HIGH (ref 6.5–8.1)

## 2021-07-30 LAB — CBC WITH DIFFERENTIAL/PLATELET
Abs Immature Granulocytes: 0.12 10*3/uL — ABNORMAL HIGH (ref 0.00–0.07)
Basophils Absolute: 0.1 10*3/uL (ref 0.0–0.1)
Basophils Relative: 1 %
Eosinophils Absolute: 0.5 10*3/uL (ref 0.0–0.5)
Eosinophils Relative: 5 %
HCT: 45.8 % (ref 36.0–46.0)
Hemoglobin: 15.3 g/dL — ABNORMAL HIGH (ref 12.0–15.0)
Immature Granulocytes: 1 %
Lymphocytes Relative: 25 %
Lymphs Abs: 2.9 10*3/uL (ref 0.7–4.0)
MCH: 29.3 pg (ref 26.0–34.0)
MCHC: 33.4 g/dL (ref 30.0–36.0)
MCV: 87.6 fL (ref 80.0–100.0)
Monocytes Absolute: 0.7 10*3/uL (ref 0.1–1.0)
Monocytes Relative: 6 %
Neutro Abs: 7.2 10*3/uL (ref 1.7–7.7)
Neutrophils Relative %: 62 %
Platelets: 411 10*3/uL — ABNORMAL HIGH (ref 150–400)
RBC: 5.23 MIL/uL — ABNORMAL HIGH (ref 3.87–5.11)
RDW: 13.4 % (ref 11.5–15.5)
WBC: 11.5 10*3/uL — ABNORMAL HIGH (ref 4.0–10.5)
nRBC: 0 % (ref 0.0–0.2)

## 2021-07-30 LAB — LIPASE, BLOOD: Lipase: 88 U/L — ABNORMAL HIGH (ref 11–51)

## 2021-07-30 LAB — URINALYSIS, ROUTINE W REFLEX MICROSCOPIC
Bacteria, UA: NONE SEEN
Bilirubin Urine: NEGATIVE
Glucose, UA: >=500 mg/dL — AB
Ketones, ur: NEGATIVE mg/dL
Leukocytes,Ua: NEGATIVE
Nitrite: NEGATIVE
Protein, ur: NEGATIVE mg/dL
Specific Gravity, Urine: 1.032 — ABNORMAL HIGH (ref 1.005–1.030)
pH: 5 (ref 5.0–8.0)

## 2021-07-30 MED ORDER — IOPAMIDOL (ISOVUE-300) INJECTION 61%
100.0000 mL | Freq: Once | INTRAVENOUS | Status: AC | PRN
Start: 2021-07-30 — End: 2021-07-30
  Administered 2021-07-30: 100 mL via INTRAVENOUS

## 2021-07-30 MED ORDER — OXYCODONE-ACETAMINOPHEN 5-325 MG PO TABS
1.0000 | ORAL_TABLET | Freq: Four times a day (QID) | ORAL | 0 refills | Status: DC | PRN
  Filled 2021-07-30: qty 12, 3d supply, fill #0

## 2021-07-30 MED ORDER — SODIUM CHLORIDE 0.9 % IV BOLUS
1000.0000 mL | Freq: Once | INTRAVENOUS | Status: AC
  Administered 2021-07-30: 1000 mL via INTRAVENOUS

## 2021-07-30 MED ORDER — MORPHINE SULFATE (PF) 4 MG/ML IV SOLN
4.0000 mg | Freq: Once | INTRAVENOUS | Status: DC
  Filled 2021-07-30: qty 1

## 2021-07-30 MED ORDER — ONDANSETRON 4 MG PO TBDP
4.0000 mg | ORAL_TABLET | ORAL | 0 refills | Status: DC | PRN
  Filled 2021-07-30: qty 10, 2d supply, fill #0

## 2021-07-30 MED ORDER — POTASSIUM CHLORIDE CRYS ER 20 MEQ PO TBCR
40.0000 meq | EXTENDED_RELEASE_TABLET | Freq: Once | ORAL | Status: AC
  Administered 2021-07-30: 40 meq via ORAL
  Filled 2021-07-30: qty 2

## 2021-07-30 MED ORDER — ONDANSETRON HCL 4 MG/2ML IJ SOLN
4.0000 mg | Freq: Once | INTRAMUSCULAR | Status: DC
  Filled 2021-07-30: qty 2

## 2021-07-30 NOTE — ED Provider Notes (Signed)
[facility] DEPT Provider Note   CSN: [id number] Arrival date & time: 07/30/21  1914     History  Chief Complaint  Patient presents with   Pancreatitis    Michelle Sanchez is a 66 y.o. female hx DM, here with abdominal pain. Patient has intermittent epigastric pain for the last 2 months. Occasional diarrhea and vomiting as well.  Patient states that she had most recently an episode of vomiting and diarrhea last weekend.  She saw PCP yesterday and had a scheduled CT abdomen pelvis outpatient that showed pancreatitis.  Patient was sent here for labs and IV fluids and pain control.  Patient has no history of pancreatitis and denies alcohol use.  The history is provided by the patient.      Home Medications Prior to Admission medications   Medication Sig Start Date End Date Taking? Authorizing Provider  AMARYL 2 MG tablet TAKE 1 TABLET DAILY BEFORE BREAKFAST. 04/29/21   Binnie Rail, MD  amitriptyline (ELAVIL) 25 MG tablet Take 1 tablet (25 mg total) by mouth at bedtime. 01/09/21   Binnie Rail, MD  amLODipine (NORVASC) 5 MG tablet TAKE 1 TABLET BY MOUTH  DAILY 01/18/20   Binnie Rail, MD  blood glucose meter kit and supplies KIT Dispense based on patient and insurance preference. Use up to four times daily as directed. (FOR E11.9). 10/25/17   Binnie Rail, MD  cyclobenzaprine (FLEXERIL) 10 MG tablet Take 1 tablet (10 mg total) by mouth 3 (three) times daily as needed. 06/24/20   Binnie Rail, MD  empagliflozin (JARDIANCE) 25 MG TABS tablet Take 1 tablet (25 mg total) by mouth daily before breakfast. 01/30/21   Burns, Claudina Lick, MD  gabapentin (NEURONTIN) 300 MG capsule TAKE 1 CAPSULE BY MOUTH IN  THE MORNING , 1 CAPSULE IN  THE AFTERNOON , AND 4  CAPSULES AT BEDTIME 09/19/20   Burns, Claudina Lick, MD  meloxicam (MOBIC) 15 MG tablet Take 1 tablet (15 mg total) by mouth daily. 04/28/21   Binnie Rail, MD  omeprazole (PRILOSEC) 20 MG capsule TAKE 1 CAPSULE BY  MOUTH  DAILY 01/18/20   Binnie Rail, MD  ondansetron (ZOFRAN) 4 MG tablet Take 1 tablet (4 mg total) by mouth every 8 (eight) hours as needed for nausea or vomiting. 07/29/21   Burns, Claudina Lick, MD  Semaglutide, 1 MG/DOSE, (OZEMPIC, 1 MG/DOSE,) 4 MG/3ML SOPN Inject 1 mg into the skin once a week. 06/20/21   Binnie Rail, MD      Allergies    Diclofenac, Prednisone, Diclofenac sodium, Metformin and related, Septra [bactrim], Sulfamethoxazole, and Trimethoprim    Review of Systems   Review of Systems  Gastrointestinal:  Positive for abdominal pain, diarrhea and vomiting.  All other systems reviewed and are negative.  Physical Exam Updated Vital Signs BP 122/83   Pulse 93   Temp 98 F (36.7 C) (Oral)   Resp 18   SpO2 96%  Physical Exam Vitals and nursing note reviewed.  Constitutional:      Comments: Slightly uncomfortable   HENT:     Head: Normocephalic.     Nose: Nose normal.     Mouth/Throat:     Mouth: Mucous membranes are dry.  Eyes:     Extraocular Movements: Extraocular movements intact.     Pupils: Pupils are equal, round, and reactive to light.  Cardiovascular:     Rate and Rhythm: Normal rate and regular rhythm.  Pulses: Normal pulses.     Heart sounds: Normal heart sounds.  Pulmonary:     Effort: Pulmonary effort is normal.     Breath sounds: Normal breath sounds.  Abdominal:     General: Abdomen is flat.     Palpations: Abdomen is soft.     Comments: + epigastric tenderness   Musculoskeletal:        General: Normal range of motion.     Cervical back: Normal range of motion and neck supple.  Skin:    General: Skin is warm.     Capillary Refill: Capillary refill takes less than 2 seconds.  Neurological:     General: No focal deficit present.     Mental Status: She is oriented to person, place, and time.  Psychiatric:        Mood and Affect: Mood normal.        Behavior: Behavior normal.    ED Results / Procedures / Treatments   Labs (all labs  ordered are listed, but only abnormal results are displayed) Labs Reviewed  COMPREHENSIVE METABOLIC PANEL - Abnormal; Notable for the following components:      Result Value   Potassium 3.3 (*)    Glucose, Bld 153 (*)    Total Protein 8.8 (*)    Alkaline Phosphatase 128 (*)    All other components within normal limits  LIPASE, BLOOD - Abnormal; Notable for the following components:   Lipase 88 (*)    All other components within normal limits  CBC WITH DIFFERENTIAL/PLATELET - Abnormal; Notable for the following components:   WBC 11.5 (*)    RBC 5.23 (*)    Hemoglobin 15.3 (*)    Platelets 411 (*)    Abs Immature Granulocytes 0.12 (*)    All other components within normal limits  URINALYSIS, ROUTINE W REFLEX MICROSCOPIC    EKG None  Radiology CT ABDOMEN PELVIS W CONTRAST  Result Date: 07/30/2021 CLINICAL DATA:  Four days of upper abdominal pain, which has been intermittent x2 months. EXAM: CT ABDOMEN AND PELVIS WITH CONTRAST TECHNIQUE: Multidetector CT imaging of the abdomen and pelvis was performed using the standard protocol following bolus administration of intravenous contrast. RADIATION DOSE REDUCTION: This exam was performed according to the departmental dose-optimization program which includes automated exposure control, adjustment of the mA and/or kV according to patient size and/or use of iterative reconstruction technique. CONTRAST:  12mL ISOVUE-300 IOPAMIDOL (ISOVUE-300) INJECTION 61% COMPARISON:  None Available. FINDINGS: Lower chest: Solid 11 mm right lower lobe pulmonary nodule on image 34/5. Solid 10 mm right middle lobe pulmonary nodule demonstrates a peripheral focus of dystrophic calcification on image 17/5. Hepatobiliary: No suspicious hepatic lesion. Gallbladder is unremarkable. No biliary ductal dilation. Pancreas: Edematous appearance of the pancreatic head with some peripancreatic stranding, and centered along the pancreaticoduodenal groove. No pancreatic ductal  dilation. Periampullary duodenal diverticula. Spleen: No splenomegaly or focal splenic lesion. Adrenals/Urinary Tract: Bilateral adrenal glands appear normal. No hydronephrosis. Kidneys demonstrate symmetric enhancement and excretion of contrast material. Urinary bladder is unremarkable for degree of distension. Stomach/Bowel: Radiopaque enteric contrast material traverses the ascending colon. Stomach is unremarkable for degree of distension. No pathologic dilation of small or large bowel. Terminal ileum appears normal. Appendix is not confidently identified however there is no pericecal inflammation. Small to moderate volume of formed stool throughout the colon. Left-sided predominant colonic diverticulosis without findings of acute diverticulitis. Vascular/Lymphatic: Normal caliber abdominal aorta. No pathologically enlarged abdominal or pelvic lymph nodes. Reproductive: Uterus and bilateral adnexa  are unremarkable. Other: No walled off fluid collections.  No pneumoperitoneum. Musculoskeletal: Multilevel degenerative changes of the spine. Degenerative change of the bilateral hips. No acute osseous abnormality. IMPRESSION: 1. Edematous appearance of the pancreatic head with some peripancreatic stranding but without pancreatic ductal dilation, most consistent with acute interstitial pancreatitis. Correlate with serum lipase levels is suggested. 2. Solid 11 mm right lower lobe pulmonary nodule and a solid 10 mm right middle lobe pulmonary nodule demonstrates a peripheral focus of dystrophic calcification. Findings are nonspecific and may represent sequela of an infectious or inflammatory process. Recommend prompt non-contrast Chest CT for further evaluation.These guidelines do not apply to immunocompromised patients and patients with cancer. Follow up in patients with significant comorbidities as clinically warranted. For lung cancer screening, adhere to Lung-RADS guidelines. Reference: Radiology. 2017;  284(1):228-43. 3. Left-sided colonic diverticulosis without findings of acute diverticulitis. 4. Small to moderate volume of formed stool throughout the colon. Electronically Signed   By: Dahlia Bailiff M.D.   On: 07/30/2021 16:07    Procedures Procedures    Medications Ordered in ED Medications  morphine (PF) 4 MG/ML injection 4 mg (4 mg Intravenous Patient Refused/Not Given 07/30/21 2115)  ondansetron (ZOFRAN) injection 4 mg (4 mg Intravenous Patient Refused/Not Given 07/30/21 2115)  potassium chloride SA (KLOR-CON M) CR tablet 40 mEq (has no administration in time range)  sodium chloride 0.9 % bolus 1,000 mL (1,000 mLs Intravenous New Bag/Given 07/30/21 2056)    ED Course/ Medical Decision Making/ A&P                           Medical Decision Making Michelle Sanchez is a 66 y.o. female here with outpatient CT that showed pancreatitis.  I reviewed her CT from outpatient study.  She had interstitial pancreatitis with no obvious necrotizing features.  Plan to get CBC and CMP and lipase and hydrate patient and give pain medicine.  9:23 PM Patient's lipase level is 88.  I reviewed her CBC and CMP as well.  Patient states that she felt better and doesn't want morphine in the ED. Will dc home with zofran, percocet.    Problems Addressed: Acute pancreatitis, unspecified complication status, unspecified pancreatitis type: acute illness or injury  Amount and/or Complexity of Data Reviewed External Data Reviewed: radiology. Labs: ordered. Decision-making details documented in ED Course.  Risk Prescription drug management.    Final Clinical Impression(s) / ED Diagnoses Final diagnoses:  None    Rx / DC Orders ED Discharge Orders     None         Drenda Freeze, MD 07/30/21 2127

## 2021-07-30 NOTE — Discharge Instructions (Addendum)
You have pancreatitis. Stay hydrated. Take zofran for nausea   Take percocet for severe pain.   See your primary care doctor and GI doctor   Return to ER if you have worse abdominal pain, vomiting, fever

## 2021-07-30 NOTE — ED Triage Notes (Signed)
Pt states that she did a CT scan and was told to come here by her provider for having pancreatitis. Pt is having upper abdominal pain and episodes of vomiting and diarrhea x months.

## 2021-07-30 NOTE — ED Notes (Signed)
Discharge instructions reviewed, questions answered. Rx education provided. Pt states understanding and no further questions. Pt ambulatory with steady gait upon discharge. No s/s of distress noted. ? ?

## 2021-07-31 ENCOUNTER — Other Ambulatory Visit (HOSPITAL_COMMUNITY): Payer: Self-pay

## 2021-07-31 ENCOUNTER — Telehealth: Payer: Self-pay | Admitting: Internal Medicine

## 2021-07-31 NOTE — Telephone Encounter (Signed)
Spoke with patient today. 

## 2021-07-31 NOTE — Telephone Encounter (Signed)
PT calls today to set up a hospital follow up. I had set her up with an appointment for next Monday with Dr.Burns.  PT also stated that due to the pancreatitis she is unsure as to what she can and can't eat. States she doesn't want to make things worse but wants to make sure she is getting in the proper nutrition as well.  CB: [phone]

## 2021-07-31 NOTE — Telephone Encounter (Signed)
She should primarily be drinking a lot of fluids-ideally clear fluids.  She should eat very bland, low-fat diet and not overeat eat so that she rest the pancreas.  Less food is better.

## 2021-08-01 DIAGNOSIS — K859 Acute pancreatitis without necrosis or infection, unspecified: Secondary | ICD-10-CM | POA: Insufficient documentation

## 2021-08-01 DIAGNOSIS — R918 Other nonspecific abnormal finding of lung field: Secondary | ICD-10-CM | POA: Insufficient documentation

## 2021-08-01 NOTE — Patient Instructions (Addendum)
Medications changes include :  none    Your prescription(s) have been sent to your pharmacy.    A Ct scan of your lung was ordered.     Someone will call you to schedule an appointment.    Return if symptoms worsen or fail to improve, for follow up as scheduled.    Acute Pancreatitis  Acute pancreatitis happens when a gland called the pancreas suddenly develops inflammation, making it irritated and swollen. The pancreas is found on the left side of the abdomen, behind the stomach. The pancreas makes proteins (enzymes) that help to digest food. It also releases the hormones glucagon and insulin. These help to regulate blood sugar. Most sudden (acute) attacks of this condition last a few days and can cause serious problems. Some people become dehydrated and develop low blood pressure. In severe cases, bleeding in the abdomen can lead to shock and can be life-threatening. The lungs, heart, and kidneys may stop working. What are the causes? This condition may be caused by: Heavy alcohol use. Drug use. Gallstones or other conditions that can block the tube that drains the pancreas (pancreatic duct). A tumor in the pancreas. Other causes include: Being exposed to certain medicines or certain chemicals. Having health conditions such as diabetes, high triglycerides, or high calcium levels in your blood. High calcium levels are usually caused by the parathyroid gland being too active. An infection in the pancreas. Damage caused by an accident (trauma) or by the poison (venom) of a scorpion sting. Abdominal surgery. Autoimmune pancreatitis. This is when the body's disease-fighting system (immune system) attacks the pancreas. Genes that are passed from parent to child (inherited). In some cases, the cause of this condition is not known. What are the signs or symptoms? Symptoms of this condition include: Pain in the upper abdomen that may spread (radiate) to the back. Pain may be severe  and often worsens after you eat. A tender and swollen abdomen. Nausea and vomiting. Fever. How is this diagnosed? This condition may be diagnosed based on: A physical exam. Blood tests. These include an increased (elevated) level of lipase or amylase. Imaging tests, such as CT scans, MRIs, or an ultrasound of the abdomen. How is this treated? Treatment for this condition often requires a hospital stay and may include: Pain medicine. IV fluids. Placing a tube in the stomach to remove stomach contents and to control vomiting (nasogastric tube, or NG tube). Not eating until vomiting has lessened. Treating any underlying conditions that may be the cause. Treatment may include: Antibiotic medicines, if your condition is caused by an infection. Steroid medicine, if your condition is caused by your immune system attacking your pancreas (autoimmune disease). Surgery on the gallbladder or pancreas, if your condition is caused by gallstones or another blockage. Follow these instructions at home: Medicines Take over-the-counter and prescription medicines only as told by your health care provider. If you were prescribed an antibiotic medicine, take it as told by your health care provider. Do not stop using the antibiotic even if you start to feel better. Ask your health care provider if the medicine prescribed to you: Requires you to avoid driving or using machinery. Can cause constipation. You may need to take these actions to prevent or treat constipation: Take over-the-counter or prescription medicines. Eat foods that are high in fiber, such as beans, whole grains, and fresh fruits and vegetables. Limit foods that are high in fat and processed sugars, such as fried or sweet foods. Eating  and drinking  Follow instructions from your health care provider about diet. This may involve avoiding alcohol and having less fat in your diet. Eat smaller, more frequent meals. Doing this causes the pancreas  to make less digestive fluid. Drink enough fluid to keep your urine pale yellow. Do not drink alcohol if it caused your condition. General instructions Do not use any products that contain nicotine or tobacco. These products include cigarettes, chewing tobacco, and vaping devices, such as e-cigarettes. If you need help quitting, ask your health care provider. Get plenty of rest. If directed, check your blood sugar at home as told by your health care provider. Keep all follow-up visits. This is important. Contact a health care provider if: You do not get better as fast as expected. Your symptoms get worse or you get new symptoms. You keep having pain, weakness, or nausea. You get better and then pain comes back. You have a fever. Get help right away if: You vomit every time you eat or drink. Your pain becomes severe. Your skin or the white parts of your eyes turn yellow (jaundice). You have sudden swelling in your abdomen. You feel dizzy or you faint. Your blood sugar is high (over 300 mg/dL). You vomit blood. These symptoms may be an emergency. Get help right away. Call 911. Do not wait to see if the symptoms will go away. Do not drive yourself to the hospital. Summary Acute pancreatitis happens when inflammation of the pancreas suddenly occurs and the pancreas becomes irritated and swollen. This condition is typically caused by heavy alcohol use, drug use, or gallstones. Treatment for this condition usually requires a stay in the hospital. This information is not intended to replace advice given to you by your health care provider. Make sure you discuss any questions you have with your health care provider. Document Revised: 01/07/2021 Document Reviewed: 01/07/2021 Elsevier Patient Education  [place].

## 2021-08-01 NOTE — Progress Notes (Signed)
Subjective:    [name], female    DOB: 20-May-1955, 66 y.o.   MRN: [id number]     HPI Michelle Sanchez is here for follow up from the hospital.  ED 5/31 per my request.  Was having intermittent epigastric pain for 2 months.  She had occasional diarrhea, N/V.  Her most recent episodes started several days ago and she came in to see me.  CT scan showed pancreatitis.  Lipase was elevated, wbc elevated.  She felt better and was discharged home with prescriptions for zofran and oxycodone.    She denies abdominal pain, nausea, fever.  Appetite is just ok.  No BM since Wednesday.   Medications and allergies reviewed with patient and updated if appropriate.  Current Outpatient Medications on File Prior to Visit  Medication Sig Dispense Refill   AMARYL 2 MG tablet TAKE 1 TABLET DAILY BEFORE BREAKFAST. 90 tablet 3   amitriptyline (ELAVIL) 25 MG tablet Take 1 tablet (25 mg total) by mouth at bedtime. 90 tablet 2   amLODipine (NORVASC) 5 MG tablet TAKE 1 TABLET BY MOUTH  DAILY 90 tablet 3   blood glucose meter kit and supplies KIT Dispense based on patient and insurance preference. Use up to four times daily as directed. (FOR E11.9). 1 each 0   empagliflozin (JARDIANCE) 25 MG TABS tablet Take 1 tablet (25 mg total) by mouth daily before breakfast. 90 tablet 2   gabapentin (NEURONTIN) 300 MG capsule TAKE 1 CAPSULE BY MOUTH IN  THE MORNING , 1 CAPSULE IN  THE AFTERNOON , AND 4  CAPSULES AT BEDTIME 540 capsule 3   meloxicam (MOBIC) 15 MG tablet Take 1 tablet (15 mg total) by mouth daily. 90 tablet 1   No current facility-administered medications on file prior to visit.     Review of Systems  Constitutional:  Positive for appetite change. Negative for fever.  Gastrointestinal:  Positive for constipation. Negative for abdominal pain, diarrhea and nausea.      Objective:   Vitals:   08/04/21 0926  BP: 110/78  Pulse: 92  Temp: 97.9 F (36.6 C)  SpO2: 98%   BP Readings from  Last 3 Encounters:  08/04/21 110/78  07/30/21 (!) 151/119  07/29/21 124/72   Wt Readings from Last 3 Encounters:  08/04/21 258 lb (117 kg)  07/29/21 258 lb (117 kg)  04/28/21 259 lb 3.2 oz (117.6 kg)   Body mass index is 39.23 kg/m.    Physical Exam     Lab Results  Component Value Date   WBC 11.5 (H) 07/30/2021   HGB 15.3 (H) 07/30/2021   HCT 45.8 07/30/2021   PLT 411 (H) 07/30/2021   GLUCOSE 153 (H) 07/30/2021   CHOL 194 07/29/2021   TRIG 154.0 (H) 07/29/2021   HDL 36.20 (L) 07/29/2021   LDLDIRECT 168.0 04/28/2021   LDLCALC 127 (H) 07/29/2021   ALT 20 07/30/2021   AST 16 07/30/2021   NA 137 07/30/2021   K 3.3 (L) 07/30/2021   CL 103 07/30/2021   CREATININE 0.72 07/30/2021   BUN 14 07/30/2021   CO2 22 07/30/2021   TSH 2.13 06/19/2020   INR 1.00 04/07/2011   HGBA1C 7.1 (H) 07/29/2021   MICROALBUR 0.8 04/28/2021    CT ABDOMEN PELVIS W CONTRAST CLINICAL DATA:  Four days of upper abdominal pain, which has been intermittent x2 months.  EXAM: CT ABDOMEN AND PELVIS WITH CONTRAST  TECHNIQUE: Multidetector CT imaging of the abdomen and pelvis  was performed using the standard protocol following bolus administration of intravenous contrast.  RADIATION DOSE REDUCTION: This exam was performed according to the departmental dose-optimization program which includes automated exposure control, adjustment of the mA and/or kV according to patient size and/or use of iterative reconstruction technique.  CONTRAST:  159mL ISOVUE-300 IOPAMIDOL (ISOVUE-300) INJECTION 61%  COMPARISON:  None Available.  FINDINGS: Lower chest: Solid 11 mm right lower lobe pulmonary nodule on image 34/5. Solid 10 mm right middle lobe pulmonary nodule demonstrates a peripheral focus of dystrophic calcification on image 17/5.  Hepatobiliary: No suspicious hepatic lesion. Gallbladder is unremarkable. No biliary ductal dilation.  Pancreas: Edematous appearance of the pancreatic head with  some peripancreatic stranding, and centered along the pancreaticoduodenal groove. No pancreatic ductal dilation. Periampullary duodenal diverticula.  Spleen: No splenomegaly or focal splenic lesion.  Adrenals/Urinary Tract: Bilateral adrenal glands appear normal. No hydronephrosis. Kidneys demonstrate symmetric enhancement and excretion of contrast material. Urinary bladder is unremarkable for degree of distension.  Stomach/Bowel: Radiopaque enteric contrast material traverses the ascending colon. Stomach is unremarkable for degree of distension. No pathologic dilation of small or large bowel. Terminal ileum appears normal. Appendix is not confidently identified however there is no pericecal inflammation. Small to moderate volume of formed stool throughout the colon. Left-sided predominant colonic diverticulosis without findings of acute diverticulitis.  Vascular/Lymphatic: Normal caliber abdominal aorta. No pathologically enlarged abdominal or pelvic lymph nodes.  Reproductive: Uterus and bilateral adnexa are unremarkable.  Other: No walled off fluid collections.  No pneumoperitoneum.  Musculoskeletal: Multilevel degenerative changes of the spine. Degenerative change of the bilateral hips. No acute osseous abnormality.  IMPRESSION: 1. Edematous appearance of the pancreatic head with some peripancreatic stranding but without pancreatic ductal dilation, most consistent with acute interstitial pancreatitis. Correlate with serum lipase levels is suggested. 2. Solid 11 mm right lower lobe pulmonary nodule and a solid 10 mm right middle lobe pulmonary nodule demonstrates a peripheral focus of dystrophic calcification. Findings are nonspecific and may represent sequela of an infectious or inflammatory process. Recommend prompt non-contrast Chest CT for further evaluation.These guidelines do not apply to immunocompromised patients and patients with cancer. Follow up in patients  with significant comorbidities as clinically warranted. For lung cancer screening, adhere to Lung-RADS guidelines. Reference: Radiology. 2017; 284(1):228-43. 3. Left-sided colonic diverticulosis without findings of acute diverticulitis. 4. Small to moderate volume of formed stool throughout the colon.  Electronically Signed   By: Dahlia Bailiff M.D.   On: 07/30/2021 16:07    Assessment & Plan:    See Problem List for Assessment and Plan of chronic medical problems.

## 2021-08-04 ENCOUNTER — Encounter: Payer: Self-pay | Admitting: Internal Medicine

## 2021-08-04 ENCOUNTER — Telehealth: Payer: Self-pay | Admitting: Internal Medicine

## 2021-08-04 ENCOUNTER — Ambulatory Visit (INDEPENDENT_AMBULATORY_CARE_PROVIDER_SITE_OTHER): Payer: Medicare Other | Admitting: Internal Medicine

## 2021-08-04 DIAGNOSIS — E1165 Type 2 diabetes mellitus with hyperglycemia: Secondary | ICD-10-CM | POA: Diagnosis not present

## 2021-08-04 DIAGNOSIS — K853 Drug induced acute pancreatitis without necrosis or infection: Secondary | ICD-10-CM

## 2021-08-04 DIAGNOSIS — I712 Thoracic aortic aneurysm, without rupture, unspecified: Secondary | ICD-10-CM

## 2021-08-04 DIAGNOSIS — I10 Essential (primary) hypertension: Secondary | ICD-10-CM

## 2021-08-04 DIAGNOSIS — Z794 Long term (current) use of insulin: Secondary | ICD-10-CM

## 2021-08-04 DIAGNOSIS — R918 Other nonspecific abnormal finding of lung field: Secondary | ICD-10-CM | POA: Diagnosis not present

## 2021-08-04 MED ORDER — ONETOUCH DELICA PLUS LANCET33G MISC: 0 refills | Status: AC

## 2021-08-04 MED ORDER — AMLODIPINE BESYLATE 5 MG PO TABS: 5.0000 mg | ORAL_TABLET | Freq: Every day | ORAL | 3 refills | Status: DC

## 2021-08-04 MED ORDER — CYCLOBENZAPRINE HCL 10 MG PO TABS: 10.0000 mg | ORAL_TABLET | Freq: Three times a day (TID) | ORAL | 5 refills | Status: DC | PRN

## 2021-08-04 MED ORDER — ONETOUCH ULTRA VI STRP: ORAL_STRIP | 3 refills | Status: AC

## 2021-08-04 MED ORDER — OMEPRAZOLE 20 MG PO CPDR: 20.0000 mg | DELAYED_RELEASE_CAPSULE | Freq: Every day | ORAL | 3 refills | Status: DC

## 2021-08-04 NOTE — Assessment & Plan Note (Signed)
New 2 nodules seen on recent CT scan-dedicated lung CT recommended and ordered today Further evaluation depending on CT results

## 2021-08-04 NOTE — Assessment & Plan Note (Signed)
Chronic Blood pressure good today Not currently taking amlodipine because of her recent illness Advise restarting amlodipine 5 mg daily

## 2021-08-04 NOTE — Assessment & Plan Note (Signed)
Acute Likely secondary to Ozempic, which we will discontinue She is feeling much better-no abdominal pain, nausea at this time Her appetite is still decreased She is still eating a bland, low-fat diet-slowly can advance Keep up with a good amount of fluids If she develops any recurrent symptoms she will call right away

## 2021-08-04 NOTE — Telephone Encounter (Signed)
Prescriptions sent to pharmacy

## 2021-08-04 NOTE — Telephone Encounter (Signed)
Patient needs the following prescriptions refilled:  Lancets - one touch delica plus 33 gauge, one touch verio test strips - amlodopine and cyclobenzine and omeprazole too  Pharmacy:  [facility] - mail order

## 2021-08-04 NOTE — Assessment & Plan Note (Signed)
Chronic Sugars are much better controlled than a few months ago Unfortunately she just had pancreatitis which may be related to the Ozempic so that was discontinued Currently not taking anything with her recent illness Restart Jardiance 25 mg daily, Amaryl 2 mg daily Encouraged regular walking and strict compliance with diabetic diet to help keep sugars controlled We will need to reevaluate your upcoming appointment about additional medications

## 2021-08-26 ENCOUNTER — Ambulatory Visit
Admission: RE | Admit: 2021-08-26 | Discharge: 2021-08-26 | Disposition: A | Discharge status: Home / Self Care | Payer: Medicare Other | Source: Ambulatory Visit | Attending: Internal Medicine | Admitting: Internal Medicine

## 2021-08-26 DIAGNOSIS — R918 Other nonspecific abnormal finding of lung field: Secondary | ICD-10-CM

## 2021-08-27 DIAGNOSIS — I712 Thoracic aortic aneurysm, without rupture, unspecified: Secondary | ICD-10-CM | POA: Insufficient documentation

## 2021-08-27 NOTE — Addendum Note (Signed)
Addended by: Binnie Rail on: 08/27/2021 07:45 PM   Modules accepted: Orders

## 2021-08-28 ENCOUNTER — Encounter: Payer: Self-pay | Admitting: Internal Medicine

## 2021-09-23 ENCOUNTER — Encounter: Payer: Self-pay | Admitting: Pulmonary Disease

## 2021-09-23 ENCOUNTER — Ambulatory Visit (INDEPENDENT_AMBULATORY_CARE_PROVIDER_SITE_OTHER): Payer: Medicare Other | Admitting: Pulmonary Disease

## 2021-09-23 VITALS — BP 130/68 | HR 109 | Temp 98.9°F | Ht 68.0 in | Wt 267.2 lb

## 2021-09-23 DIAGNOSIS — R911 Solitary pulmonary nodule: Secondary | ICD-10-CM | POA: Diagnosis not present

## 2021-09-23 NOTE — Patient Instructions (Signed)
Thank you for visiting Dr. Valeta Harms at [facility]. Today we recommend the following:  Orders Placed This Encounter  Procedures   NM PET Image Initial (PI) Skull Base To Thigh (F-18 FDG)   Return in about 3 weeks (around 10/14/2021) for with Eric Form, NP, or Dr. Valeta Harms.    Please do your part to reduce the spread of COVID-19.

## 2021-09-23 NOTE — Progress Notes (Signed)
Synopsis: Referred in June 2023 for multiple lung nodules by Binnie Rail, MD  Subjective:   [name] GENDER: female DOB: [birth year], MRN: [id number]  Chief Complaint  Patient presents with   Consult    Pt is here for consult for multiple lung nodules. Pt states she had a ct done last month. Pt states that she normally has no issues noted with her breathing. Pt does have slight sinus issues noted.     Michelle Sanchez is a 66 y.o. F with PMH of allergies, T2DM, GERD, HTN, OSA, who was incidentally found to have two R lung nodules on CT abdomen/pelvis for pancreatitis in May 2023. Dedicated CT chest revealed a 12 mm noncalcified nodule in the R lower lobe with other partly calcified nodules in the R middle and L lower lobes concerning for granulomas.  Patient has never smoked before. She has secondhand smoke exposure from her parents who smoked tobacco when she was a child. She worked as a Engineer, structural and has never had a known tuberculosis exposure and has never had a positive PPD. She has no personal cancer history; her mother had breast cancer and father had esophageal and liver cancers. She is not up to date on cancer screenings. She denies travel outside of the state and does not have any exotic pets. She denies fever, chills, unintentional weight loss, night sweats. She does not have any respiratory complaints at this time.  She does report that her CPAP is minimally functional at this point and requests assistance obtaining a new machine. It has been several years since her last sleep study.  Past Medical History:  Diagnosis Date   Allergic rhinitis    Allergy    Arthritis     knee s/p TKR 2/13   Depression    GERD (gastroesophageal reflux disease)    Hypertension    Migraines    OSA (obstructive sleep apnea)    uses cpap nightly   Shingles outbreak 12/2013   L arm/torso   Sleep apnea    cpap   Spinal stenosis      Family History  Problem Relation  Age of Onset   Arthritis Mother    Breast cancer Mother    Emphysema Mother    Asthma Mother    Heart disease Mother    Arthritis Father    Cancer Father    Alcohol abuse Other    Diabetes Other    Colon cancer Neg Hx      Past Surgical History:  Procedure Laterality Date   Knee replacement Left 2012   KNEE SURGERY     both knees   TOTAL KNEE ARTHROPLASTY  04/13/2011   Procedure: TOTAL KNEE ARTHROPLASTY;  Surgeon: Gearlean Alf, MD;  Location: [facility];  Service: Orthopedics;  Laterality: Left;    Social History   Socioeconomic History   Marital status: Single    Spouse name: Not on file   Number of children: Not on file   Years of education: Not on file   Highest education level: Not on file  Occupational History   Occupation: retired  Tobacco Use   Smoking status: Never   Smokeless tobacco: Never  Substance and Sexual Activity   Alcohol use: Yes    Alcohol/week: 1.0 standard drink of alcohol    Types: 1 Glasses of wine per week    Comment: rare social   Drug use: No   Sexual activity: Yes  Other Topics Concern  Not on file  Social History Narrative   Not on file   Social Determinants of Health   Financial Resource Strain: Not on file  Food Insecurity: Not on file  Transportation Needs: Not on file  Physical Activity: Not on file  Stress: Not on file  Social Connections: Not on file  Intimate Partner Violence: Not on file     Allergies  Allergen Reactions   Diclofenac Hives   Prednisone     Migraines   Diclofenac Sodium Itching   Metformin And Related     GI upset   Ozempic (0.25 Or 0.5 Mg-Dose) [Semaglutide(0.25 Or 0.51m-Dos)]     pancreatitis   Septra [Bactrim] Diarrhea   Sulfamethoxazole    Trimethoprim      Outpatient Medications Prior to Visit  Medication Sig Dispense Refill   AMARYL 2 MG tablet TAKE 1 TABLET DAILY BEFORE BREAKFAST. 90 tablet 3   amitriptyline (ELAVIL) 25 MG tablet Take 1 tablet (25 mg total) by mouth at bedtime. 90  tablet 2   amLODipine (NORVASC) 5 MG tablet Take 1 tablet (5 mg total) by mouth daily. 90 tablet 3   blood glucose meter kit and supplies KIT Dispense based on patient and insurance preference. Use up to four times daily as directed. (FOR E11.9). 1 each 0   cyclobenzaprine (FLEXERIL) 10 MG tablet Take 1 tablet (10 mg total) by mouth 3 (three) times daily as needed. 60 tablet 5   empagliflozin (JARDIANCE) 25 MG TABS tablet Take 1 tablet (25 mg total) by mouth daily before breakfast. 90 tablet 2   gabapentin (NEURONTIN) 300 MG capsule TAKE 1 CAPSULE BY MOUTH IN  THE MORNING , 1 CAPSULE IN  THE AFTERNOON , AND 4  CAPSULES AT BEDTIME 540 capsule 3   glucose blood (ONETOUCH ULTRA) test strip Use as directed to check blood sugar once daily E11.9 100 each 3   Lancets (ONETOUCH DELICA PLUS [id number] MISC Use as directed to check blood sugar once daily E11.9 100 each 0   meloxicam (MOBIC) 15 MG tablet Take 1 tablet (15 mg total) by mouth daily. 90 tablet 1   omeprazole (PRILOSEC) 20 MG capsule Take 1 capsule (20 mg total) by mouth daily. 90 capsule 3   No facility-administered medications prior to visit.    Review of Systems  Constitutional:  Negative for chills, diaphoresis, fever, malaise/fatigue and weight loss.  HENT:  Positive for congestion.   Respiratory:  Negative for shortness of breath and wheezing.   Cardiovascular:  Negative for orthopnea and PND.  Psychiatric/Behavioral:  The patient is nervous/anxious.     Objective:  Constitutional:Pleasant female in no acute distress. Cardio:Regular rate and rhythm. Pulm:Clear to auscultation bilaterally. Normal work of breathing on room air. MYOV:ZCHYIFOYfor distal pitting edema.  Skin:Warm and dry. Neuro:Awake, alert, no focal deficit noted. Psych:Pleasant mood and affect.   Vitals:   09/23/21 1457  BP: 130/68  Pulse: (!) 109  Temp: 98.9 F (37.2 C)  TempSrc: Oral  SpO2: 95%  Weight: 267 lb 3.2 oz (121.2 kg)  Height: _0  (1.727 m)    95% on RA BMI Readings from Last 3 Encounters:  09/23/21 40.63 kg/m  08/04/21 39.23 kg/m  07/29/21 39.23 kg/m   Wt Readings from Last 3 Encounters:  09/23/21 267 lb 3.2 oz (121.2 kg)  08/04/21 258 lb (117 kg)  07/29/21 258 lb (117 kg)     CBC    Component Value Date/Time   WBC 11.5 (H) 07/30/2021 1937  RBC 5.23 (H) 07/30/2021 1937   HGB 15.3 (H) 07/30/2021 1937   HCT 45.8 07/30/2021 1937   PLT 411 (H) 07/30/2021 1937   MCV 87.6 07/30/2021 1937   MCH 29.3 07/30/2021 1937   MCHC 33.4 07/30/2021 1937   RDW 13.4 07/30/2021 1937   LYMPHSABS 2.9 07/30/2021 1937   MONOABS 0.7 07/30/2021 1937   EOSABS 0.5 07/30/2021 1937   BASOSABS 0.1 07/30/2021 1937      Chest Imaging: CT chest: There is 12 mm noncalcified nodule in the right lower lobe. There are other partly calcified nodules in the right middle lobe and left lower lobe, possibly granulomas. There is no significant lymphadenopathy. There is no pleural effusion.  Pulmonary Functions Testing Results:     No data to display          FeNO:   Pathology:   Echocardiogram:   Heart Catheterization:     Assessment & Plan:     ICD-10-CM   1. Lung nodule  R91.1 NM PET Image Initial (PI) Skull Base To Thigh (F-18 FDG)      Discussion: Patient presents for a referral due to multiple lung nodules noted incidentally on imaging obtained to assess severe abdominal pain in May 2023. She has minimal risk factors for cancer but does have amily history of breast cancer in her motehr and esophageal and licer cancer in her father. She is not up to date on cancer screening. Solitary pulmonary nodule malignancy risk score of 6% however we do not have a PET scan at this time, the results of which could increase her chance of malignancy significantly. -PET scan -F/u after PET scan for further recommendations -Will help facilitate sleep clinic visit so that she is able to obtain a new CPAP   Current Outpatient Medications:     AMARYL 2 MG tablet, TAKE 1 TABLET DAILY BEFORE BREAKFAST., Disp: 90 tablet, Rfl: 3   amitriptyline (ELAVIL) 25 MG tablet, Take 1 tablet (25 mg total) by mouth at bedtime., Disp: 90 tablet, Rfl: 2   amLODipine (NORVASC) 5 MG tablet, Take 1 tablet (5 mg total) by mouth daily., Disp: 90 tablet, Rfl: 3   blood glucose meter kit and supplies KIT, Dispense based on patient and insurance preference. Use up to four times daily as directed. (FOR E11.9)., Disp: 1 each, Rfl: 0   cyclobenzaprine (FLEXERIL) 10 MG tablet, Take 1 tablet (10 mg total) by mouth 3 (three) times daily as needed., Disp: 60 tablet, Rfl: 5   empagliflozin (JARDIANCE) 25 MG TABS tablet, Take 1 tablet (25 mg total) by mouth daily before breakfast., Disp: 90 tablet, Rfl: 2   gabapentin (NEURONTIN) 300 MG capsule, TAKE 1 CAPSULE BY MOUTH IN  THE MORNING , 1 CAPSULE IN  THE AFTERNOON , AND 4  CAPSULES AT BEDTIME, Disp: 540 capsule, Rfl: 3   glucose blood (ONETOUCH ULTRA) test strip, Use as directed to check blood sugar once daily E11.9, Disp: 100 each, Rfl: 3   Lancets (ONETOUCH DELICA PLUS [id number]) MISC, Use as directed to check blood sugar once daily E11.9, Disp: 100 each, Rfl: 0   meloxicam (MOBIC) 15 MG tablet, Take 1 tablet (15 mg total) by mouth daily., Disp: 90 tablet, Rfl: 1   omeprazole (PRILOSEC) 20 MG capsule, Take 1 capsule (20 mg total) by mouth daily., Disp: 90 capsule, Rfl: 3  I spent 35 minutes dedicated to the care of this patient on the date of this encounter to include pre-visit review of records, face-to-face time with  the patient discussing conditions above, post visit ordering of testing, clinical documentation with the electronic health record, making appropriate referrals as documented, and communicating necessary findings to members of the patients care team.   Farrel Gordon, DO [facility] 09/23/2021 4:19 PM

## 2021-09-23 NOTE — Progress Notes (Signed)
PCCM attending:  This is a 66 year old female, past medical history of hypertension, OSA, pulmonary nodules, gastroesophageal reflux, diabetes, obesity.Patient referred today for abnormal CT chest that was completed on 08/26/2021 which revealed a 12 mm noncalcified right lower lobe pulmonary nodule.  Patient has other partially calcified nodules in the right middle lobe consistent with granulomatous disease.  BP 130/68 (BP Location: Right Arm, Patient Position: Sitting, Cuff Size: Normal)   Pulse (!) 109   Temp 98.9 F (37.2 C) (Oral)   Ht '5\' 8"'$  (1.727 m)   Wt 267 lb 3.2 oz (121.2 kg)   SpO2 95%   BMI 40.63 kg/m   General: Obese female resting comfortably, no distress HEENT: Tracking appropriately, NCAT Heart: Regular rhythm S1-S2 none lungs: Clear to auscultation bilaterally no crackles no wheeze  Labs: Reviewed Imaging: CT chest 08/26/2021: 12 mm noncalcified right lower lobe pulmonary nodule.  Has other small calcified partially calcified nodules consistent with granulomatous disease. No adenopathy, no pleural effusion. The patient's images have been independently reviewed by me.    Assessment: Right lower lobe pulmonary nodule Other multiple calcified nodules Non-smoker No history, no personal history of malignancy  Plan: Nuclear medicine pet imaging. Follow-up Eric Form, NP to review nuclear medicine PET scan.  I agree with documentation from Farrel Gordon, DO, PGY 2.  I spent 62 minutes dedicated to the care of this patient on the date of this encounter to include pre-visit review of records, face-to-face time with the patient discussing conditions above, post visit ordering of testing, clinical documentation with the electronic health record, making appropriate referrals as documented, and communicating necessary findings to members of the patients care team.    Garner Nash, [facility] 09/23/2021 3:27 PM

## 2021-10-01 ENCOUNTER — Encounter (HOSPITAL_COMMUNITY)
Admission: RE | Admit: 2021-10-01 | Discharge: 2021-10-01 | Disposition: A | Discharge status: Home / Self Care | Payer: Medicare Other | Source: Ambulatory Visit | Attending: Pulmonary Disease | Admitting: Pulmonary Disease

## 2021-10-01 ENCOUNTER — Other Ambulatory Visit: Payer: Self-pay | Admitting: Internal Medicine

## 2021-10-01 DIAGNOSIS — R911 Solitary pulmonary nodule: Secondary | ICD-10-CM | POA: Diagnosis present

## 2021-10-01 LAB — GLUCOSE, CAPILLARY: Glucose-Capillary: 210 mg/dL — ABNORMAL HIGH (ref 70–99)

## 2021-10-01 MED ORDER — FLUDEOXYGLUCOSE F - 18 (FDG) INJECTION
13.3200 | Freq: Once | INTRAVENOUS | Status: AC | PRN
  Administered 2021-10-01: 13.32 via INTRAVENOUS

## 2021-10-10 ENCOUNTER — Ambulatory Visit (INDEPENDENT_AMBULATORY_CARE_PROVIDER_SITE_OTHER): Payer: Medicare Other | Admitting: Acute Care

## 2021-10-10 ENCOUNTER — Encounter: Payer: Self-pay | Admitting: Acute Care

## 2021-10-10 VITALS — BP 132/78 | HR 97 | Temp 98.0°F | Ht 67.0 in | Wt 267.4 lb

## 2021-10-10 DIAGNOSIS — G4733 Obstructive sleep apnea (adult) (pediatric): Secondary | ICD-10-CM | POA: Diagnosis not present

## 2021-10-10 DIAGNOSIS — R911 Solitary pulmonary nodule: Secondary | ICD-10-CM

## 2021-10-10 DIAGNOSIS — I10 Essential (primary) hypertension: Secondary | ICD-10-CM

## 2021-10-10 DIAGNOSIS — Z9989 Dependence on other enabling machines and devices: Secondary | ICD-10-CM

## 2021-10-10 NOTE — Progress Notes (Signed)
History of Present Illness Michelle Sanchez is a 66 y.o. female never smoker with past medical history of hypertension, OSA, pulmonary nodules, gastroesophageal reflux, diabetes, obesity.She was referred to pulmonary for an abnormal CT chest that was completed on 08/26/2021 which revealed a 12 mm noncalcified right lower lobe pulmonary nodule.  She was seen by Dr. Valeta Harms for follow up. .  8/11/2023Pt. Presents for follow up. She had a PET scan done as follow up to the abnormal CT Chest 08/26/2021. PET scan showed no abnormal FDG in the bilateral pulmonary nodules most consistent with a benign process. She does have a significant family history of lung cancer , so she is a bit anxious about this nodule. We discussed options for follow up to include 3 month follow up, 6 month follow up or biopsy now. She wants to do a 3 month follow up.She is considering biopsy after the follow up as she wants to know what the nodule is.  Otherwise she is doing well. No breathing issues or complaints.  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    Test Results: 10/01/2021 PET Scan Chest CHEST: No hypermetabolic thoracic adenopathy.   No abnormal FDG avidity right middle lobe pulmonary nodule which measures 11 mm on image 40/7.   No abnormal FDG avidity in the additional scattered bilateral pulmonary nodules some of which demonstrate internal calcifications measuring up to 9 mm in the right lower lobe on image 47/7.   Incidental CT findings: Aortic atherosclerosis. No pleural effusion or pneumothorax.  No abnormal FDG in the bilateral pulmonary nodules most consistent with a benign process.  CT Chest 08/27/2021 Lungs/Pleura: In image 99 of series 8 there is 12 mm nodule in the lateral aspect of right middle lobe. There is small coarse calcification in the posterior aspect of this nodule. In the  image 117 there is 12 mm noncalcified nodule in the right lower lobe. In image 116 there is 9 mm nodule with small coarse calcifications. Small linear densities in the left parahilar region may suggest minimal scarring or subsegmental atelectasis. There is no focal pulmonary consolidation. There is no pleural effusion or pneumothorax. There is 12 mm noncalcified nodule in the right lower lobe. Follow-up PET-CT and tissue sampling as warranted should be considered.   There are other partly calcified nodules in the right middle lobe and left lower lobe, possibly granulomas. These could also be evaluated for increased metabolic activity in the PET scan.   There is no significant lymphadenopathy. There is no pleural effusion.   There is ectasia of main pulmonary artery suggesting pulmonary arterial hypertension.   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         Latest Ref Rng & Units 07/30/2021    7:37 PM 07/29/2021   11:45 AM 04/28/2021    2:53 PM  CBC  WBC 4.0 - 10.5 K/uL 11.5  17.0  8.4   Hemoglobin 12.0 - 15.0 g/dL 15.3  15.2  15.3   Hematocrit 36.0 - 46.0 % 45.8  45.5  46.4   Platelets 150 - 400 K/uL 411  342.0  306.0        Latest Ref Rng & Units 07/30/2021    7:37 PM 07/29/2021   11:45 AM 04/28/2021    2:53 PM  BMP  Glucose 70 - 99 mg/dL 153  115  190   BUN 8 - 23 mg/dL $Remove'14  18  14   'ysExrzV$ Creatinine 0.44 - 1.00 mg/dL 0.72  0.64  0.72   Sodium 135 - 145 mmol/L 137  137  136   Potassium 3.5 - 5.1 mmol/L 3.3  3.7  4.0   Chloride 98 - 111 mmol/L 103  98  98   CO2 22 - 32 mmol/L $RemoveB'22  23  30   'lQZBAfQc$ Calcium 8.9 - 10.3 mg/dL 9.8  10.3  10.4     BNP No results found for: "BNP"  ProBNP No results found for: "PROBNP"  PFT No results found for: "FEV1PRE", "FEV1POST", "FVCPRE", "FVCPOST", "TLC", "DLCOUNC", "PREFEV1FVCRT", "PSTFEV1FVCRT"  NM PET Image Initial (PI) Skull Base To Thigh (F-18 FDG)  Result Date: 10/01/2021 CLINICAL  DATA:  Initial treatment strategy for pulmonary nodule. EXAM: NUCLEAR MEDICINE PET SKULL BASE TO THIGH TECHNIQUE: 13.32 mCi F-18 FDG was injected intravenously. Full-ring PET imaging was performed from the skull base to thigh after the radiotracer. CT data was obtained and used for attenuation correction and anatomic localization. Fasting blood glucose: 210 mg/dl COMPARISON:  Chest CT August 26, 2021 and CT abdomen pelvis Jul 30, 2021 FINDINGS: Mediastinal blood pool activity: SUV max 2.1 Liver activity: SUV max NA NECK: No hypermetabolic cervical adenopathy. Incidental CT findings: none CHEST: No hypermetabolic thoracic adenopathy. No abnormal FDG avidity right middle lobe pulmonary nodule which measures 11 mm on image 40/7. No abnormal FDG avidity in the additional scattered bilateral pulmonary nodules some of which demonstrate internal calcifications measuring up to 9 mm in the right lower lobe on image 47/7. Incidental CT findings: Aortic atherosclerosis. No pleural effusion or pneumothorax. ABDOMEN/PELVIS: No abnormal hypermetabolic activity within the liver, pancreas, adrenal glands, or spleen. No hypermetabolic lymph nodes in the abdomen or pelvis. Incidental CT findings: Cholelithiasis without findings of acute cholecystitis. Colonic diverticulosis without findings of acute diverticulitis. Aortic atherosclerosis. SKELETON: No focal hypermetabolic activity to suggest skeletal metastasis. Incidental CT findings: none IMPRESSION: 1. No abnormal FDG in the bilateral pulmonary nodules most consistent with a benign process. 2. Cholelithiasis without findings of acute cholecystitis. 3. Colonic diverticulosis without findings of acute diverticulitis. 4.  Aortic Atherosclerosis (ICD10-I70.0). Electronically Signed   By: Dahlia Bailiff M.D.   On: 10/01/2021 11:18     Past medical hx Past Medical History:  Diagnosis Date   Allergic rhinitis    Allergy    Arthritis     knee s/p TKR 2/13   Depression    GERD  (gastroesophageal reflux disease)    Hypertension    Migraines    OSA (obstructive sleep apnea)    uses cpap nightly   Shingles outbreak 12/2013   L arm/torso   Sleep apnea    cpap   Spinal stenosis      Social History   Tobacco Use   Smoking status: Never   Smokeless tobacco: Never  Substance Use Topics   Alcohol use: Yes    Alcohol/week: 1.0 standard drink of alcohol    Types: 1 Glasses of wine per week    Comment: rare social   Drug use: No    Ms.Crehan reports that she has never smoked. She has never used smokeless tobacco. She reports current alcohol use of about 1.0 standard drink of alcohol per week. She reports that she does not use drugs.  Tobacco Cessation: Never smoker   Past surgical hx, Family hx, Social hx all reviewed.  Current Outpatient Medications on File Prior to Visit  Medication Sig   AMARYL 2 MG tablet TAKE 1 TABLET DAILY BEFORE BREAKFAST.   amitriptyline (ELAVIL) 25 MG tablet Take 1 tablet (25 mg total) by mouth at bedtime.   amLODipine (NORVASC) 5 MG tablet Take 1 tablet (5 mg total) by mouth daily.   blood glucose meter kit and supplies KIT Dispense based on patient and insurance preference. Use up to four times daily as directed. (FOR E11.9).   cyclobenzaprine (FLEXERIL) 10 MG tablet Take 1 tablet (10 mg total) by mouth 3 (three) times daily as needed.   gabapentin (NEURONTIN) 300 MG capsule TAKE 1 CAPSULE BY MOUTH IN  THE MORNING , 1 CAPSULE IN  THE AFTERNOON , AND 4  CAPSULES AT BEDTIME   glucose blood (ONETOUCH ULTRA) test strip Use as directed to check blood sugar once daily E11.9   JARDIANCE 25 MG TABS tablet TAKE 1 TABLET DAILY BEFORE BREAKFAST (NEW DOSE)   Lancets (ONETOUCH DELICA PLUS [id number]) MISC Use as directed to check blood sugar once daily E11.9   meloxicam (MOBIC) 15 MG tablet Take 1 tablet (15 mg total) by mouth daily.   omeprazole (PRILOSEC) 20 MG capsule Take 1 capsule (20 mg total) by mouth daily.   No current  facility-administered medications on file prior to visit.     Allergies  Allergen Reactions   Diclofenac Hives   Prednisone     Migraines   Diclofenac Sodium Itching   Metformin And Related     GI upset   Ozempic (0.25 Or 0.5 Mg-Dose) [Semaglutide(0.25 Or 0.5mg -Dos)]     pancreatitis   Septra [Bactrim] Diarrhea   Sulfamethoxazole    Trimethoprim     Review Of Systems:  Constitutional:   No  weight loss, night sweats,  Fevers, chills, fatigue, or  lassitude.  HEENT:   No headaches,  Difficulty swallowing,  Tooth/dental problems, or  Sore throat,                No sneezing, itching, ear ache, nasal congestion, post nasal drip,   CV:  No chest pain,  Orthopnea, PND, swelling in lower extremities, anasarca, dizziness, palpitations, syncope.   GI  No heartburn, indigestion, abdominal pain, nausea, vomiting, diarrhea, change in bowel habits, loss of appetite, bloody stools.   Resp: No shortness of breath with exertion or at rest.  No excess mucus, no productive cough,  No non-productive cough,  No coughing up of blood.  No change in color of mucus.  No wheezing.  No chest wall deformity  Skin: no rash or lesions.  GU: no dysuria, change in color of urine, no urgency or frequency.  No flank pain, no hematuria   MS:  No joint pain or swelling.  No decreased range of motion.  No back pain.  Psych:  No change in mood or affect. No depression or anxiety.  No memory loss.   Vital Signs BP 132/78 (BP Location: Left Arm, Patient Position: Sitting, Cuff Size: Large)   Pulse 97   Temp 98 F (36.7 C) (Oral)   Ht 5\' 7"  (1.702 m)   Wt 267 lb 6.4 oz (121.3 kg)   SpO2 94%   BMI 41.88 kg/m    Physical Exam:  General- No distress,  A&Ox3, pleasant ENT: No sinus tenderness, TM clear, pale nasal mucosa, no oral exudate,no post nasal drip, no LAN Cardiac: S1, S2, regular rate and rhythm, no murmur Chest: No wheeze/ rales/ dullness; no accessory muscle use, no nasal flaring, no sternal  retractions Abd.: Soft  Non-tender, ND, BS +, Body mass index is 41.88 kg/m.  Ext: No clubbing cyanosis, edema Neuro:  normal strength, MAE x 4, A&O x 3 Skin: No rashes, warm and dry, no lesions  Psych: normal mood and behavior   Assessment/Plan Right Lower Pulmonary nodule  Non Smoker  No history, no personal history of malignancy Family History of lung cancer  Plan We will do a 3 month follow up CT Chest without contrast. You will get a call to get this scheduled closer to the time  We will re-evaluate after that follow up scan.  If the area has grown, we will biopsy.  Call sooner  if you have any bloody sputum or for unintentional weight loss Call if you need [place] sooner.  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   I spent 35 minutes dedicated to the care of this patient on the date of this encounter to include pre-visit review of records, face-to-face time with the patient discussing conditions above, post visit ordering of testing, clinical documentation with the electronic health record, making appropriate referrals as documented, and communicating necessary information to the patient's healthcare team.    Magdalen Spatz, NP 10/10/2021  10:07 AM

## 2021-10-10 NOTE — Patient Instructions (Addendum)
It is good to see you today We will do a 3 month follow up CT Chest without contrast. You will get a call to get this scheduled closer to the time  We will re-evaluate after that follow up scan.  If the area has grown, we will biopsy.  Call sooner  if you have any bloody sputum or for unintentional weight loss Call if you need [place] sooner.  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

## 2021-10-27 ENCOUNTER — Encounter: Payer: Self-pay | Admitting: Internal Medicine

## 2021-10-27 NOTE — Patient Instructions (Signed)
     Blood work was ordered.     Medications changes include :   zetia 10 mg daily for your cholesterol    Your prescription(s) have been sent to your pharmacy.      Return in about 6 months (around 04/30/2022) for follow up.

## 2021-10-27 NOTE — Progress Notes (Unsigned)
Subjective:    [name], female    DOB: 10-Mar-1955, 66 y.o.   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  Flexeril did not help. Located in the left lower back and radiates to her hip.  No N/T in the lower back.  It feels muscular.    Walking 2/week.  Trying to eat better.    Medications and allergies reviewed with patient and updated if appropriate.  Current Outpatient Medications on File Prior to Visit  Medication Sig Dispense Refill   AMARYL 2 MG tablet TAKE 1 TABLET DAILY BEFORE BREAKFAST. 90 tablet 3   amitriptyline (ELAVIL) 25 MG tablet Take 1 tablet (25 mg total) by mouth at bedtime. 90 tablet 2   amLODipine (NORVASC) 5 MG tablet Take 1 tablet (5 mg total) by mouth daily. 90 tablet 3   blood glucose meter kit and supplies KIT Dispense based on patient and insurance preference. Use up to four times daily as directed. (FOR E11.9). 1 each 0   cyclobenzaprine (FLEXERIL) 10 MG tablet Take 1 tablet (10 mg total) by mouth 3 (three) times daily as needed. 60 tablet 5   gabapentin (NEURONTIN) 300 MG capsule TAKE 1 CAPSULE BY MOUTH IN  THE MORNING , 1 CAPSULE IN  THE AFTERNOON , AND 4  CAPSULES AT BEDTIME 540 capsule 3   glucose blood (ONETOUCH ULTRA) test strip Use as directed to check blood sugar once daily E11.9 100 each 3   JARDIANCE 25 MG TABS tablet TAKE 1 TABLET DAILY BEFORE BREAKFAST (NEW DOSE) 90 tablet 2   Lancets (ONETOUCH DELICA PLUS [id number]) MISC Use as directed to check blood sugar once daily E11.9 100 each 0   meloxicam (MOBIC) 15 MG tablet Take 1 tablet (15 mg total) by mouth daily. 90 tablet 1   omeprazole (PRILOSEC) 20 MG capsule Take 1 capsule (20 mg total) by mouth daily. 90 capsule 3   No current facility-administered medications on  file prior to visit.     Review of Systems  Constitutional:  Negative for fever.  Respiratory:  Positive for cough (at night - PND related). Negative for shortness of breath and wheezing.   Cardiovascular:  Positive for leg swelling (rare). Negative for chest pain and palpitations.  Gastrointestinal:  Negative for abdominal pain.  Neurological:  Negative for light-headedness and headaches.       Objective:   Vitals:   10/28/21 1438  BP: 122/80  Pulse: 96  Temp: 98 F (36.7 C)  SpO2: 93%   BP Readings from Last 3 Encounters:  10/28/21 122/80  10/10/21 132/78  09/23/21 130/68   Wt Readings from Last 3 Encounters:  10/28/21 269 lb (122 kg)  10/10/21 267 lb 6.4 oz (121.3 kg)  09/23/21 267 lb 3.2 oz (121.2 kg)   Body mass index is 42.13 kg/m.    Physical Exam Constitutional:      General: She is not in acute distress.    Appearance: Normal appearance.  HENT:     Head: Normocephalic and atraumatic.  Eyes:     Conjunctiva/sclera: Conjunctivae normal.  Cardiovascular:     Rate and Rhythm: Normal rate and regular rhythm.     Heart sounds: Normal heart sounds. No murmur heard.  Pulmonary:     Effort: Pulmonary effort is normal. No respiratory distress.     Breath sounds: Normal breath sounds. No wheezing.  Musculoskeletal:        General: Tenderness (Mild tenderness with palpation lower lumbar spine and across back towards left SI joint) present.     Cervical back: Neck supple.     Right lower leg: No edema.     Left lower leg: No edema.  Lymphadenopathy:     Cervical: No cervical adenopathy.  Skin:    General: Skin is warm and dry.     Findings: No rash.  Neurological:     Mental Status: She is alert. Mental status is at baseline.  Psychiatric:        Mood and Affect: Mood normal.        Behavior: Behavior normal.        Lab Results  Component Value Date   WBC 11.5 (H) 07/30/2021   HGB 15.3 (H) 07/30/2021   HCT 45.8 07/30/2021   PLT 411 (H) 07/30/2021    GLUCOSE 153 (H) 07/30/2021   CHOL 194 07/29/2021   TRIG 154.0 (H) 07/29/2021   HDL 36.20 (L) 07/29/2021   LDLDIRECT 168.0 04/28/2021   LDLCALC 127 (H) 07/29/2021   ALT 20 07/30/2021   AST 16 07/30/2021   NA 137 07/30/2021   K 3.3 (L) 07/30/2021   CL 103 07/30/2021   CREATININE 0.72 07/30/2021   BUN 14 07/30/2021   CO2 22 07/30/2021   TSH 2.13 06/19/2020   INR 1.00 04/07/2011   HGBA1C 7.1 (H) 07/29/2021   MICROALBUR 0.8 04/28/2021     Assessment & Plan:    See Problem List for Assessment and Plan of chronic medical problems.

## 2021-10-28 ENCOUNTER — Ambulatory Visit (INDEPENDENT_AMBULATORY_CARE_PROVIDER_SITE_OTHER): Payer: Medicare Other | Admitting: Internal Medicine

## 2021-10-28 VITALS — BP 122/80 | HR 96 | Temp 98.0°F | Wt 269.0 lb

## 2021-10-28 DIAGNOSIS — E1165 Type 2 diabetes mellitus with hyperglycemia: Secondary | ICD-10-CM | POA: Diagnosis not present

## 2021-10-28 DIAGNOSIS — G629 Polyneuropathy, unspecified: Secondary | ICD-10-CM

## 2021-10-28 DIAGNOSIS — I10 Essential (primary) hypertension: Secondary | ICD-10-CM

## 2021-10-28 DIAGNOSIS — K219 Gastro-esophageal reflux disease without esophagitis: Secondary | ICD-10-CM | POA: Diagnosis not present

## 2021-10-28 DIAGNOSIS — M62830 Muscle spasm of back: Secondary | ICD-10-CM | POA: Insufficient documentation

## 2021-10-28 DIAGNOSIS — Z794 Long term (current) use of insulin: Secondary | ICD-10-CM | POA: Diagnosis not present

## 2021-10-28 DIAGNOSIS — E782 Mixed hyperlipidemia: Secondary | ICD-10-CM

## 2021-10-28 DIAGNOSIS — G43009 Migraine without aura, not intractable, without status migrainosus: Secondary | ICD-10-CM

## 2021-10-28 LAB — CBC WITH DIFFERENTIAL/PLATELET
Basophils Absolute: 0 10*3/uL (ref 0.0–0.1)
Basophils Relative: 0.3 % (ref 0.0–3.0)
Eosinophils Absolute: 0.2 10*3/uL (ref 0.0–0.7)
Eosinophils Relative: 2.5 % (ref 0.0–5.0)
HCT: 43.6 % (ref 36.0–46.0)
Hemoglobin: 14.5 g/dL (ref 12.0–15.0)
Lymphocytes Relative: 26.8 % (ref 12.0–46.0)
Lymphs Abs: 2.4 10*3/uL (ref 0.7–4.0)
MCHC: 33.2 g/dL (ref 30.0–36.0)
MCV: 85.8 fl (ref 78.0–100.0)
Monocytes Absolute: 0.6 10*3/uL (ref 0.1–1.0)
Monocytes Relative: 6.2 % (ref 3.0–12.0)
Neutro Abs: 5.7 10*3/uL (ref 1.4–7.7)
Neutrophils Relative %: 64.2 % (ref 43.0–77.0)
Platelets: 264 10*3/uL (ref 150.0–400.0)
RBC: 5.07 Mil/uL (ref 3.87–5.11)
RDW: 13.9 % (ref 11.5–15.5)
WBC: 8.9 10*3/uL (ref 4.0–10.5)

## 2021-10-28 LAB — HEMOGLOBIN A1C: Hgb A1c MFr Bld: 8.8 % — ABNORMAL HIGH (ref 4.6–6.5)

## 2021-10-28 MED ORDER — EZETIMIBE 10 MG PO TABS: 10.0000 mg | ORAL_TABLET | Freq: Every day | ORAL | 2 refills | Status: DC

## 2021-10-28 NOTE — Assessment & Plan Note (Signed)
Chronic Occasional migraines only Overall controlled Continue amitriptyline 25 mg at bedtime

## 2021-10-28 NOTE — Assessment & Plan Note (Signed)
Acute Muscle spasm Central-left lower back Gets this intermittently Slept wrong a few nights ago Has taken Flexeril without much improvement Takes meloxicam 15 mg daily No radiating pain, numbness or tingling We will continue Flexeril at night, heat and meloxicam 15 mg daily

## 2021-10-28 NOTE — Assessment & Plan Note (Signed)
Chronic GERD controlled Continue omeprazole 20 mg daily  

## 2021-10-28 NOTE — Assessment & Plan Note (Addendum)
Chronic  Lab Results  Component Value Date   HGBA1C 7.1 (H) 07/29/2021   Sugars not ideally controlled Check sugars once a day Check A1c Continue Jardiance 25 mg daily, glimepiride 2 mg daily Stressed regular exercise, diabetic diet-work on more compliance with diabetic diet

## 2021-10-28 NOTE — Assessment & Plan Note (Addendum)
Chronic Regular exercise and healthy diet encouraged Check lipid panel  Deferred statin Start zetia 10 mg daily

## 2021-10-28 NOTE — Assessment & Plan Note (Signed)
Chronic °Blood pressure well controlled °CMP °Continue amlodipine 5 mg daily °

## 2021-10-28 NOTE — Assessment & Plan Note (Signed)
Chronic Secondary to back surgery Continue gabapentin 300 mg in the morning, 300 mg in afternoon and 1200 mg at bedtime Continue amitriptyline 25 mg at bedtime

## 2021-10-29 ENCOUNTER — Ambulatory Visit: Payer: Medicare Other | Admitting: Internal Medicine

## 2021-10-29 LAB — COMPREHENSIVE METABOLIC PANEL
ALT: 17 U/L (ref 0–35)
AST: 19 U/L (ref 0–37)
Albumin: 4.5 g/dL (ref 3.5–5.2)
Alkaline Phosphatase: 92 U/L (ref 39–117)
BUN: 15 mg/dL (ref 6–23)
CO2: 26 mEq/L (ref 19–32)
Calcium: 9.9 mg/dL (ref 8.4–10.5)
Chloride: 102 mEq/L (ref 96–112)
Creatinine, Ser: 0.72 mg/dL (ref 0.40–1.20)
GFR: 87.43 mL/min (ref >60.00)
Glucose, Bld: 145 mg/dL — ABNORMAL HIGH (ref 70–99)
Potassium: 4.1 mEq/L (ref 3.5–5.1)
Sodium: 140 mEq/L (ref 135–145)
Total Bilirubin: 0.5 mg/dL (ref 0.2–1.2)
Total Protein: 8.3 g/dL (ref 6.0–8.3)

## 2021-10-29 LAB — LIPID PANEL
Cholesterol: 232 mg/dL — ABNORMAL HIGH (ref 0–200)
HDL: 46.1 mg/dL (ref >39.00)
NonHDL: 185.96
Total CHOL/HDL Ratio: 5
Triglycerides: 263 mg/dL — ABNORMAL HIGH (ref 0.0–149.0)
VLDL: 52.6 mg/dL — ABNORMAL HIGH (ref 0.0–40.0)

## 2021-10-29 LAB — LDL CHOLESTEROL, DIRECT: Direct LDL: 143 mg/dL

## 2021-10-30 ENCOUNTER — Encounter: Payer: Self-pay | Admitting: Internal Medicine

## 2021-10-31 ENCOUNTER — Telehealth: Payer: Self-pay

## 2021-10-31 NOTE — Telephone Encounter (Signed)
ATC pt to get an update on her most recent mammogram or to see if pt is needing assistance with sched an apptmnt to have one done. Last Mammogram was in 2010.

## 2021-11-04 NOTE — Progress Notes (Signed)
Patty Sermons, please let patient know I have reviewed her PET scan.  Nodules are stable.  She needs a repeat noncontrasted CT scan of the chest in 1 year.  Please place follow-up order for noncontrasted CT scan of the chest in 1 year and have follow-up with SG, NP or myself after.  Thanks,  BLI  Garner Nash, DO [facility] 11/04/2021 2:22 PM

## 2021-11-05 ENCOUNTER — Other Ambulatory Visit: Payer: Self-pay

## 2021-11-05 DIAGNOSIS — R911 Solitary pulmonary nodule: Secondary | ICD-10-CM

## 2021-11-18 ENCOUNTER — Ambulatory Visit (INDEPENDENT_AMBULATORY_CARE_PROVIDER_SITE_OTHER): Payer: Medicare Other | Admitting: Pulmonary Disease

## 2021-11-18 ENCOUNTER — Encounter: Payer: Self-pay | Admitting: Pulmonary Disease

## 2021-11-18 VITALS — BP 120/76 | HR 101 | Temp 97.7°F | Ht 67.0 in | Wt 271.4 lb

## 2021-11-18 DIAGNOSIS — G4733 Obstructive sleep apnea (adult) (pediatric): Secondary | ICD-10-CM | POA: Diagnosis not present

## 2021-11-18 DIAGNOSIS — Z9989 Dependence on other enabling machines and devices: Secondary | ICD-10-CM

## 2021-11-18 NOTE — Addendum Note (Signed)
Addended by: Dessie Coma on: 11/18/2021 04:19 PM   Modules accepted: Orders

## 2021-11-18 NOTE — Progress Notes (Signed)
Michelle Sanchez    [id number]    11-11-1955  Primary Care Physician:Burns, Claudina Lick, MD  Referring Physician: Binnie Rail, MD [place],  [place] [id number]  Chief complaint:   Patient being seen for obstructive sleep apnea has been on CPAP for over 20 years   HPI:  Machine is becoming dysfunctional  Warnings of being passed its motor life  Uses CPAP nightly  Denies any significant problems with CPAP  Usually goes to bed between 12 and 2 AM Falls asleep in about 30 minutes Final wake up time about 7-7 30  Wakes up feeling like she is at a good nights rest No significant dryness of the mouth in the morning No morning headaches Both parents snored  History of hypertension, diabetes-well-controlled  Never smoker  History of lung nodule  Outpatient Encounter Medications as of 11/18/2021  Medication Sig   AMARYL 2 MG tablet TAKE 1 TABLET DAILY BEFORE BREAKFAST.   amitriptyline (ELAVIL) 25 MG tablet Take 1 tablet (25 mg total) by mouth at bedtime.   amLODipine (NORVASC) 5 MG tablet Take 1 tablet (5 mg total) by mouth daily.   blood glucose meter kit and supplies KIT Dispense based on patient and insurance preference. Use up to four times daily as directed. (FOR E11.9).   cyclobenzaprine (FLEXERIL) 10 MG tablet Take 1 tablet (10 mg total) by mouth 3 (three) times daily as needed.   ezetimibe (ZETIA) 10 MG tablet Take 1 tablet (10 mg total) by mouth daily.   gabapentin (NEURONTIN) 300 MG capsule TAKE 1 CAPSULE BY MOUTH IN  THE MORNING , 1 CAPSULE IN  THE AFTERNOON , AND 4  CAPSULES AT BEDTIME   glucose blood (ONETOUCH ULTRA) test strip Use as directed to check blood sugar once daily E11.9   JARDIANCE 25 MG TABS tablet TAKE 1 TABLET DAILY BEFORE BREAKFAST (NEW DOSE)   Lancets (ONETOUCH DELICA PLUS [id number]) MISC Use as directed to check blood sugar once daily E11.9   meloxicam (MOBIC) 15 MG tablet Take 1 tablet (15 mg total) by mouth daily.    omeprazole (PRILOSEC) 20 MG capsule Take 1 capsule (20 mg total) by mouth daily.   No facility-administered encounter medications on file as of 11/18/2021.    Allergies as of 11/18/2021 - Review Complete 11/18/2021  Allergen Reaction Noted   Diclofenac Hives 03/28/2014   Ozempic (0.25 or 0.5 mg-dose) [semaglutide(0.25 or 0.5mg -dos)]  08/04/2021   Prednisone  04/07/2011   Diclofenac sodium Itching 02/18/2011   Metformin and related Nausea Only 01/03/2019   Septra [bactrim] Diarrhea 02/18/2011   Trimethoprim Diarrhea 06/24/2020    Past Medical History:  Diagnosis Date   Allergic rhinitis    Allergy    Arthritis     knee s/p TKR 2/13   Depression    GERD (gastroesophageal reflux disease)    Hypertension    Migraines    OSA (obstructive sleep apnea)    uses cpap nightly   Shingles outbreak 12/2013   L arm/torso   Sleep apnea    cpap   Spinal stenosis     Past Surgical History:  Procedure Laterality Date   Knee replacement Left 2012   KNEE SURGERY     both knees   TOTAL KNEE ARTHROPLASTY  04/13/2011   Procedure: TOTAL KNEE ARTHROPLASTY;  Surgeon: Gearlean Alf, MD;  Location: [facility];  Service: Orthopedics;  Laterality: Left;    Family History  Problem Relation  Age of Onset   Arthritis Mother    Breast cancer Mother    Emphysema Mother    Asthma Mother    Heart disease Mother    Arthritis Father    Cancer Father    Alcohol abuse Other    Diabetes Other    Colon cancer Neg Hx     Social History   Socioeconomic History   Marital status: Single    Spouse name: Not on file   Number of children: Not on file   Years of education: Not on file   Highest education level: Not on file  Occupational History   Occupation: retired  Tobacco Use   Smoking status: Never   Smokeless tobacco: Never  Substance and Sexual Activity   Alcohol use: Yes    Alcohol/week: 1.0 standard drink of alcohol    Types: 1 Glasses of wine per week    Comment: rare social   Drug use:  No   Sexual activity: Yes  Other Topics Concern   Not on file  Social History Narrative   Not on file   Social Determinants of Health   Financial Resource Strain: Not on file  Food Insecurity: Not on file  Transportation Needs: Not on file  Physical Activity: Not on file  Stress: Not on file  Social Connections: Not on file  Intimate Partner Violence: Not on file    Review of Systems  Constitutional:  Negative for fatigue.  Respiratory:  Positive for apnea.   Psychiatric/Behavioral:  Positive for sleep disturbance.     Vitals:   11/18/21 1431  BP: 120/76  Pulse: (!) 101  Temp: 97.7 F (36.5 C)  SpO2: 96%     Physical Exam Constitutional:      Appearance: She is obese.  HENT:     Head: Normocephalic.     Mouth/Throat:     Mouth: Mucous membranes are moist.     Comments: Mallampati 3, crowded oropharynx Cardiovascular:     Rate and Rhythm: Normal rate and regular rhythm.     Pulses: Normal pulses.     Heart sounds: No murmur heard.    No friction rub.  Pulmonary:     Effort: No respiratory distress.     Breath sounds: No stridor. No wheezing or rhonchi.  Musculoskeletal:     Cervical back: No rigidity or tenderness.  Neurological:     Mental Status: She is alert.       11/18/2021    2:00 PM  Results of the Epworth flowsheet  Sitting and reading 0  Watching TV 0  Sitting, inactive in a public place (e.g. a theatre or a meeting) 0  As a passenger in a car for an hour without a break 0  Lying down to rest in the afternoon when circumstances permit 2  Sitting and talking to someone 0  Sitting quietly after a lunch without alcohol 0  In a car, while stopped for a few minutes in traffic 0  Total score 2   Data Reviewed: Download not available  Assessment:  History of severe obstructive sleep apnea  Compliant with CPAP  Class III obesity  Diabetes Hypertension -Well-controlled  Lung nodule being followed  Plan/Recommendations:  Will  contact [facility] with an auto CPAP prescription  Encouraged to continue using current machine  Tentative follow-up in 4 to 5 months  Graded exercise as tolerated   Sherrilyn Rist MD [facility] 11/18/2021, 2:58 PM  CC: Binnie Rail,  MD   

## 2021-11-18 NOTE — Patient Instructions (Signed)
We will send a prescription to [facility] for CPAP  Auto CPAP 5-20 with heated humidification  We will wait to hear back from the medical supply company to see what is covered, if not you may need a home sleep study to confirm you still have significant sleep apnea  Call us with significant concerns  Tentative follow-up in 4 to 5 months

## 2021-12-12 ENCOUNTER — Encounter: Payer: Self-pay | Admitting: Internal Medicine

## 2021-12-12 ENCOUNTER — Other Ambulatory Visit: Payer: Self-pay

## 2021-12-12 MED ORDER — GABAPENTIN 300 MG PO CAPS: ORAL_CAPSULE | ORAL | 3 refills | Status: DC

## 2021-12-15 ENCOUNTER — Ambulatory Visit: Payer: Medicare Other

## 2021-12-15 DIAGNOSIS — G4733 Obstructive sleep apnea (adult) (pediatric): Secondary | ICD-10-CM

## 2021-12-30 ENCOUNTER — Telehealth: Payer: Self-pay | Admitting: Pulmonary Disease

## 2021-12-30 DIAGNOSIS — G4733 Obstructive sleep apnea (adult) (pediatric): Secondary | ICD-10-CM | POA: Diagnosis not present

## 2021-12-30 NOTE — Telephone Encounter (Signed)
Call patient  Sleep study result  Date of study: 12/15/2021  Impression: Severe obstructive sleep apnea Moderate oxygen desaturations  Recommendation: DME referral  Recommend CPAP therapy for moderate obstructive sleep apnea  Auto titrating CPAP with pressure settings of 5-20 will be appropriate  Encourage weight loss measures  Follow-up in the office 4 to 6 weeks following initiation of treatment

## 2021-12-31 NOTE — Telephone Encounter (Signed)
I left a message for the patient to call back for sleep study results.

## 2022-01-01 NOTE — Telephone Encounter (Signed)
Please call to make a follow up with Dr. Jenetta Downer to go over sleep study results.

## 2022-01-02 NOTE — Telephone Encounter (Signed)
Pt scheduled 11/16 at 11:15am with AO. Nothing further needed

## 2022-01-12 ENCOUNTER — Telehealth: Payer: Self-pay | Admitting: Internal Medicine

## 2022-01-12 NOTE — Telephone Encounter (Signed)
LVM for pt to rtn my call to schedule AWV with [facility] call back # [phone] 

## 2022-01-15 ENCOUNTER — Encounter: Payer: Self-pay | Admitting: Pulmonary Disease

## 2022-01-15 ENCOUNTER — Ambulatory Visit (HOSPITAL_BASED_OUTPATIENT_CLINIC_OR_DEPARTMENT_OTHER)
Admission: RE | Admit: 2022-01-15 | Discharge: 2022-01-15 | Disposition: A | Discharge status: Home / Self Care | Payer: Medicare Other | Source: Ambulatory Visit | Attending: Acute Care | Admitting: Acute Care

## 2022-01-15 ENCOUNTER — Ambulatory Visit (INDEPENDENT_AMBULATORY_CARE_PROVIDER_SITE_OTHER): Payer: Medicare Other | Admitting: Pulmonary Disease

## 2022-01-15 ENCOUNTER — Encounter (HOSPITAL_BASED_OUTPATIENT_CLINIC_OR_DEPARTMENT_OTHER): Payer: Self-pay

## 2022-01-15 VITALS — BP 128/74 | HR 93 | Temp 97.1°F | Ht 68.0 in | Wt 278.0 lb

## 2022-01-15 DIAGNOSIS — R911 Solitary pulmonary nodule: Secondary | ICD-10-CM | POA: Diagnosis present

## 2022-01-15 DIAGNOSIS — G4733 Obstructive sleep apnea (adult) (pediatric): Secondary | ICD-10-CM

## 2022-01-15 NOTE — Addendum Note (Signed)
Addended by: Monna Fam L on: 01/15/2022 12:14 PM   Modules accepted: Orders

## 2022-01-15 NOTE — Progress Notes (Signed)
Michelle Sanchez    [id number]    1956/01/08  Primary Care Physician:Burns, Claudina Lick, MD  Referring Physician: Binnie Rail, MD [place],  [place] [address]  Chief complaint:   Patient being seen for obstructive sleep apnea has been on CPAP for over 20 years   HPI:  Machine is becoming dysfunctional  Recently had a home sleep study showing severe obstructive sleep apnea with AHI of about 30  Not sleeping well overall  Denies significant concerns with current CPAP apart from just not working well  Usually goes to bed between 12 and 2 AM Falls asleep in about 30 minutes Final wake up time about 7-7 30  Wakes up feeling like she is at a good nights rest No significant dryness of the mouth in the morning No morning headaches Both parents snored  History of hypertension, diabetes-well-controlled  Never smoker  History of lung nodule  Outpatient Encounter Medications as of 01/15/2022  Medication Sig   AMARYL 2 MG tablet TAKE 1 TABLET DAILY BEFORE BREAKFAST.   amitriptyline (ELAVIL) 25 MG tablet Take 1 tablet (25 mg total) by mouth at bedtime.   amLODipine (NORVASC) 5 MG tablet Take 1 tablet (5 mg total) by mouth daily.   blood glucose meter kit and supplies KIT Dispense based on patient and insurance preference. Use up to four times daily as directed. (FOR E11.9).   cyclobenzaprine (FLEXERIL) 10 MG tablet Take 1 tablet (10 mg total) by mouth 3 (three) times daily as needed.   ezetimibe (ZETIA) 10 MG tablet Take 1 tablet (10 mg total) by mouth daily.   gabapentin (NEURONTIN) 300 MG capsule TAKE 1 CAPSULE BY MOUTH IN  THE MORNING , 1 CAPSULE IN  THE AFTERNOON , AND 4  CAPSULES AT BEDTIME   glucose blood (ONETOUCH ULTRA) test strip Use as directed to check blood sugar once daily E11.9   JARDIANCE 25 MG TABS tablet TAKE 1 TABLET DAILY BEFORE BREAKFAST (NEW DOSE)   Lancets (ONETOUCH DELICA PLUS [id number]) MISC Use as directed to check blood sugar once  daily E11.9   meloxicam (MOBIC) 15 MG tablet Take 1 tablet (15 mg total) by mouth daily.   omeprazole (PRILOSEC) 20 MG capsule Take 1 capsule (20 mg total) by mouth daily.   No facility-administered encounter medications on file as of 01/15/2022.    Allergies as of 01/15/2022 - Review Complete 01/15/2022  Allergen Reaction Noted   Diclofenac Hives 03/28/2014   Ozempic (0.25 or 0.5 mg-dose) [semaglutide(0.25 or 0.57m-dos)]  08/04/2021   Prednisone  04/07/2011   Diclofenac sodium Itching 02/18/2011   Metformin and related Nausea Only 01/03/2019   Septra [bactrim] Diarrhea 02/18/2011   Trimethoprim Diarrhea 06/24/2020    Past Medical History:  Diagnosis Date   Allergic rhinitis    Allergy    Arthritis     knee s/p TKR 2/13   Depression    GERD (gastroesophageal reflux disease)    Hypertension    Migraines    OSA (obstructive sleep apnea)    uses cpap nightly   Shingles outbreak 12/2013   L arm/torso   Sleep apnea    cpap   Spinal stenosis     Past Surgical History:  Procedure Laterality Date   Knee replacement Left 2012   KNEE SURGERY     both knees   TOTAL KNEE ARTHROPLASTY  04/13/2011   Procedure: TOTAL KNEE ARTHROPLASTY;  Surgeon: FGearlean Alf MD;  Location:  [facility];  Service: Orthopedics;  Laterality: Left;    Family History  Problem Relation Age of Onset   Arthritis Mother    Breast cancer Mother    Emphysema Mother    Asthma Mother    Heart disease Mother    Arthritis Father    Cancer Father    Alcohol abuse Other    Diabetes Other    Colon cancer Neg Hx     Social History   Socioeconomic History   Marital status: Single    Spouse name: Not on file   Number of children: Not on file   Years of education: Not on file   Highest education level: Not on file  Occupational History   Occupation: retired  Tobacco Use   Smoking status: Never   Smokeless tobacco: Never  Substance and Sexual Activity   Alcohol use: Yes    Alcohol/week: 1.0 standard  drink of alcohol    Types: 1 Glasses of wine per week    Comment: rare social   Drug use: No   Sexual activity: Yes  Other Topics Concern   Not on file  Social History Narrative   Not on file   Social Determinants of Health   Financial Resource Strain: Not on file  Food Insecurity: Not on file  Transportation Needs: Not on file  Physical Activity: Not on file  Stress: Not on file  Social Connections: Not on file  Intimate Partner Violence: Not on file    Review of Systems  Constitutional:  Negative for fatigue.  Respiratory:  Positive for apnea.   Psychiatric/Behavioral:  Positive for sleep disturbance.     Vitals:   01/15/22 1144  BP: 128/74  Pulse: 93  Temp: (!) 97.1 F (36.2 C)  SpO2: 96%     Physical Exam Constitutional:      Appearance: She is obese.  HENT:     Head: Normocephalic.     Mouth/Throat:     Mouth: Mucous membranes are moist.     Comments: Mallampati 3, crowded oropharynx Cardiovascular:     Rate and Rhythm: Normal rate and regular rhythm.     Pulses: Normal pulses.     Heart sounds: No murmur heard.    No friction rub.  Pulmonary:     Effort: No respiratory distress.     Breath sounds: No stridor. No wheezing or rhonchi.  Musculoskeletal:     Cervical back: No rigidity or tenderness.  Neurological:     Mental Status: She is alert.  Psychiatric:        Mood and Affect: Mood normal.       11/18/2021    2:00 PM  Results of the Epworth flowsheet  Sitting and reading 0  Watching TV 0  Sitting, inactive in a public place (e.g. a theatre or a meeting) 0  As a passenger in a car for an hour without a break 0  Lying down to rest in the afternoon when circumstances permit 2  Sitting and talking to someone 0  Sitting quietly after a lunch without alcohol 0  In a car, while stopped for a few minutes in traffic 0  Total score 2   Data Reviewed: No download available   Most recent sleep study 12/15/2021 showing severe obstructive sleep  apnea  Assessment:  Severe obstructive sleep apnea -Needs a new CPAP machine  Class III obesity -Encouraged graded activities as tolerated  Diabetes Hypertension -Well-controlled  Lung nodule being followed  Plan/Recommendations:  Contact [facility] for auto CPAP 5-20  Follow-up in 3 months  Graded activities as tolerated  Lung nodule being followed  Recent PET scan 10/01/2021 shows no increased FDG uptake  Sherrilyn Rist MD [facility] 01/15/2022, 11:56 AM  CC: Binnie Rail, MD

## 2022-01-15 NOTE — Patient Instructions (Addendum)
We will contact medical supply company to set you up with the CPAP  Your sleep study did show severe obstructive sleep apnea  I will see you back in about 3 months  Graded exercise as tolerated  Call with significant concerns

## 2022-01-19 ENCOUNTER — Other Ambulatory Visit: Payer: Self-pay | Admitting: Internal Medicine

## 2022-02-09 ENCOUNTER — Encounter: Payer: Self-pay | Admitting: Internal Medicine

## 2022-04-03 ENCOUNTER — Ambulatory Visit: Payer: Medicare Other | Admitting: Pulmonary Disease

## 2022-04-17 ENCOUNTER — Encounter: Payer: Self-pay | Admitting: Pulmonary Disease

## 2022-04-17 ENCOUNTER — Ambulatory Visit (INDEPENDENT_AMBULATORY_CARE_PROVIDER_SITE_OTHER): Payer: Medicare Other | Admitting: Pulmonary Disease

## 2022-04-17 VITALS — BP 120/80 | HR 102 | Ht 68.0 in | Wt 278.0 lb

## 2022-04-17 DIAGNOSIS — G4733 Obstructive sleep apnea (adult) (pediatric): Secondary | ICD-10-CM

## 2022-04-17 NOTE — Patient Instructions (Signed)
Your  CPAP is working very well  Continue using CPAP nightly  Call us with significant concerns  I will see you 6 months from here

## 2022-04-17 NOTE — Progress Notes (Signed)
Michelle Sanchez    [id number]    01/31/1956  Primary Care Physician:Burns, Claudina Lick, MD  Referring Physician: Binnie Rail, MD [address] [place],  [place] [address]  Chief complaint:   Patient being seen for obstructive sleep apnea has been on CPAP for over 20 years   HPI:  Recently obtain an upgraded machine  Tolerating CPAP well with no significant concerns Sleeping well and waking up feeling like she is at a good nights rest  Does not have any concerns about her sleep apnea at present  Sleep habits remain about the same usually goes to bed between 12 and 2, falls asleep quickly Wake up time about 7 to 7:30 in the morning  She is being treated for severe obstructive sleep apnea and seems to be doing well  Wakes up feeling like she is at a good nights rest No significant dryness of the mouth in the morning No morning headaches Both parents snored  History of hypertension, diabetes-well-controlled  Never smoker  History of lung nodule  Outpatient Encounter Medications as of 04/17/2022  Medication Sig   AMARYL 2 MG tablet TAKE 1 TABLET DAILY BEFORE BREAKFAST.   amitriptyline (ELAVIL) 25 MG tablet TAKE 1 TABLET AT BEDTIME   amLODipine (NORVASC) 5 MG tablet Take 1 tablet (5 mg total) by mouth daily.   blood glucose meter kit and supplies KIT Dispense based on patient and insurance preference. Use up to four times daily as directed. (FOR E11.9).   cyclobenzaprine (FLEXERIL) 10 MG tablet Take 1 tablet (10 mg total) by mouth 3 (three) times daily as needed.   ezetimibe (ZETIA) 10 MG tablet Take 1 tablet (10 mg total) by mouth daily.   gabapentin (NEURONTIN) 300 MG capsule TAKE 1 CAPSULE BY MOUTH IN  THE MORNING , 1 CAPSULE IN  THE AFTERNOON , AND 4  CAPSULES AT BEDTIME   glucose blood (ONETOUCH ULTRA) test strip Use as directed to check blood sugar once daily E11.9   JARDIANCE 25 MG TABS tablet TAKE 1 TABLET DAILY BEFORE BREAKFAST (NEW DOSE)   Lancets  (ONETOUCH DELICA PLUS 123XX123) MISC Use as directed to check blood sugar once daily E11.9   meloxicam (MOBIC) 15 MG tablet TAKE 1 TABLET DAILY   omeprazole (PRILOSEC) 20 MG capsule Take 1 capsule (20 mg total) by mouth daily.   No facility-administered encounter medications on file as of 04/17/2022.    Allergies as of 04/17/2022 - Review Complete 04/17/2022  Allergen Reaction Noted   Diclofenac Hives 03/28/2014   Ozempic (0.25 or 0.5 mg-dose) [semaglutide(0.25 or 0.83m-dos)]  08/04/2021   Prednisone  04/07/2011   Diclofenac sodium Itching 02/18/2011   Metformin and related Nausea Only 01/03/2019   Septra [bactrim] Diarrhea 02/18/2011   Trimethoprim Diarrhea 06/24/2020    Past Medical History:  Diagnosis Date   Allergic rhinitis    Allergy    Arthritis     knee s/p TKR 2/13   Depression    GERD (gastroesophageal reflux disease)    Hypertension    Migraines    OSA (obstructive sleep apnea)    uses cpap nightly   Shingles outbreak 12/2013   L arm/torso   Sleep apnea    cpap   Spinal stenosis     Past Surgical History:  Procedure Laterality Date   Knee replacement Left 2012   KNEE SURGERY     both knees   TOTAL KNEE ARTHROPLASTY  04/13/2011   Procedure:  TOTAL KNEE ARTHROPLASTY;  Surgeon: Gearlean Alf, MD;  Location: [facility];  Service: Orthopedics;  Laterality: Left;    Family History  Problem Relation Age of Onset   Arthritis Mother    Breast cancer Mother    Emphysema Mother    Asthma Mother    Heart disease Mother    Arthritis Father    Cancer Father    Alcohol abuse Other    Diabetes Other    Colon cancer Neg Hx     Social History   Socioeconomic History   Marital status: Single    Spouse name: Not on file   Number of children: Not on file   Years of education: Not on file   Highest education level: Not on file  Occupational History   Occupation: retired  Tobacco Use   Smoking status: Never   Smokeless tobacco: Never  Substance and Sexual  Activity   Alcohol use: Yes    Alcohol/week: 1.0 standard drink of alcohol    Types: 1 Glasses of wine per week    Comment: rare social   Drug use: No   Sexual activity: Yes  Other Topics Concern   Not on file  Social History Narrative   Not on file   Social Determinants of Health   Financial Resource Strain: Not on file  Food Insecurity: Not on file  Transportation Needs: Not on file  Physical Activity: Not on file  Stress: Not on file  Social Connections: Not on file  Intimate Partner Violence: Not on file    Review of Systems  Constitutional:  Negative for fatigue.  Respiratory:  Positive for apnea.   Psychiatric/Behavioral:  Positive for sleep disturbance.     Vitals:   04/17/22 1045  BP: 120/80  Pulse: (!) 102  SpO2: 97%     Physical Exam Constitutional:      Appearance: She is obese.  HENT:     Head: Normocephalic.     Mouth/Throat:     Mouth: Mucous membranes are moist.     Comments: Mallampati 3, crowded oropharynx Eyes:     General: No scleral icterus.    Pupils: Pupils are equal, round, and reactive to light.  Cardiovascular:     Rate and Rhythm: Normal rate and regular rhythm.     Pulses: Normal pulses.     Heart sounds: No murmur heard.    No friction rub.  Pulmonary:     Effort: No respiratory distress.     Breath sounds: No stridor. No wheezing or rhonchi.  Musculoskeletal:     Cervical back: No rigidity or tenderness.  Neurological:     Mental Status: She is alert.  Psychiatric:        Mood and Affect: Mood normal.       11/18/2021    2:00 PM  Results of the Epworth flowsheet  Sitting and reading 0  Watching TV 0  Sitting, inactive in a public place (e.g. a theatre or a meeting) 0  As a passenger in a car for an hour without a break 0  Lying down to rest in the afternoon when circumstances permit 2  Sitting and talking to someone 0  Sitting quietly after a lunch without alcohol 0  In a car, while stopped for a few minutes in  traffic 0  Total score 2   Data Reviewed: Compliance download from the machine shows 100% compliance Average use of 8 hours 30 minutes Set between 5 and 20 Residual AHI  of 4.895 percentile pressure of 12.1   Most recent sleep study 12/15/2021 showing severe obstructive sleep apnea  Assessment:  Severe obstructive sleep apnea -Well treated with current CPAP therapy  Class III obesity -Encouraged to continue graded activities as tolerated  Diabetes Hypertension -Well-controlled  Lung nodule being followed -Last CT scan November 2023 showed stable findings  Plan/Recommendations:   Continue CPAP on a nightly basis  Follow-up in about 6 months  Encouraged to call with significant concerns  Graded activities as tolerated  Lung nodule being followed  Recent PET scan 10/01/2021 shows no increased FDG uptake  Sherrilyn Rist MD [facility] 04/17/2022, 10:50 AM  CC: Binnie Rail, MD

## 2022-04-29 ENCOUNTER — Ambulatory Visit (INDEPENDENT_AMBULATORY_CARE_PROVIDER_SITE_OTHER): Payer: Medicare Other | Admitting: Internal Medicine

## 2022-04-29 ENCOUNTER — Encounter: Payer: Self-pay | Admitting: Internal Medicine

## 2022-04-29 VITALS — BP 122/78 | HR 90 | Temp 98.2°F | Ht 68.0 in | Wt 277.0 lb

## 2022-04-29 DIAGNOSIS — E782 Mixed hyperlipidemia: Secondary | ICD-10-CM | POA: Diagnosis not present

## 2022-04-29 DIAGNOSIS — E1165 Type 2 diabetes mellitus with hyperglycemia: Secondary | ICD-10-CM | POA: Diagnosis not present

## 2022-04-29 DIAGNOSIS — Z6841 Body Mass Index (BMI) 40.0 and over, adult: Secondary | ICD-10-CM

## 2022-04-29 DIAGNOSIS — Z794 Long term (current) use of insulin: Secondary | ICD-10-CM | POA: Diagnosis not present

## 2022-04-29 DIAGNOSIS — G629 Polyneuropathy, unspecified: Secondary | ICD-10-CM

## 2022-04-29 DIAGNOSIS — K219 Gastro-esophageal reflux disease without esophagitis: Secondary | ICD-10-CM

## 2022-04-29 DIAGNOSIS — G43009 Migraine without aura, not intractable, without status migrainosus: Secondary | ICD-10-CM

## 2022-04-29 DIAGNOSIS — G4733 Obstructive sleep apnea (adult) (pediatric): Secondary | ICD-10-CM

## 2022-04-29 DIAGNOSIS — I10 Essential (primary) hypertension: Secondary | ICD-10-CM

## 2022-04-29 LAB — LDL CHOLESTEROL, DIRECT: Direct LDL: 126 mg/dL

## 2022-04-29 LAB — COMPREHENSIVE METABOLIC PANEL
ALT: 20 U/L (ref 0–35)
AST: 16 U/L (ref 0–37)
Albumin: 4.2 g/dL (ref 3.5–5.2)
Alkaline Phosphatase: 107 U/L (ref 39–117)
BUN: 13 mg/dL (ref 6–23)
CO2: 28 mEq/L (ref 19–32)
Calcium: 10.2 mg/dL (ref 8.4–10.5)
Chloride: 102 mEq/L (ref 96–112)
Creatinine, Ser: 0.64 mg/dL (ref 0.40–1.20)
GFR: 92.08 mL/min (ref >60.00)
Glucose, Bld: 193 mg/dL — ABNORMAL HIGH (ref 70–99)
Potassium: 4.7 mEq/L (ref 3.5–5.1)
Sodium: 139 mEq/L (ref 135–145)
Total Bilirubin: 0.5 mg/dL (ref 0.2–1.2)
Total Protein: 7.9 g/dL (ref 6.0–8.3)

## 2022-04-29 LAB — LIPID PANEL
Cholesterol: 191 mg/dL (ref 0–200)
HDL: 48.3 mg/dL (ref >39.00)
NonHDL: 142.72
Total CHOL/HDL Ratio: 4
Triglycerides: 214 mg/dL — ABNORMAL HIGH (ref 0.0–149.0)
VLDL: 42.8 mg/dL — ABNORMAL HIGH (ref 0.0–40.0)

## 2022-04-29 LAB — MICROALBUMIN / CREATININE URINE RATIO
Creatinine,U: 30.9 mg/dL
Microalb Creat Ratio: 2.3 mg/g (ref 0.0–30.0)
Microalb, Ur: 0.7 mg/dL (ref 0.0–1.9)

## 2022-04-29 LAB — HEMOGLOBIN A1C: Hgb A1c MFr Bld: 9.3 % — ABNORMAL HIGH (ref 4.6–6.5)

## 2022-04-29 NOTE — Assessment & Plan Note (Signed)
Chronic She has not been exercising-stressed regular exercise.  For a while she was walking regularly and felt "great-encouraged her to restart walking-she understands the importance of regular exercise Stressed diabetic diet She has decreased her sugar intake-discussed that she can avoid more medication with and weight loss

## 2022-04-29 NOTE — Patient Instructions (Addendum)
      Blood work was ordered.   The lab is on the first floor.    Medications changes include :   none      Return in about 6 months (around 10/28/2022) for follow up.

## 2022-04-29 NOTE — Assessment & Plan Note (Signed)
Chronic Occasional migraines Continue amitriptyline 25 mg at bedtime

## 2022-04-29 NOTE — Assessment & Plan Note (Signed)
Chronic Regular exercise and healthy diet encouraged Check lipid panel  Continue Zetia 10 mg daily

## 2022-04-29 NOTE — Assessment & Plan Note (Signed)
Chronic GERD controlled Continue omeprazole 20 mg daily  

## 2022-04-29 NOTE — Assessment & Plan Note (Signed)
Chronic Related to back pain, but diabetes may start contributing Continue gabapentin 300 mg in the morning and afternoon and 1200 mg at bedtime Continue amitriptyline 25 mg at bedtime

## 2022-04-29 NOTE — Assessment & Plan Note (Signed)
Chronic °Blood pressure well controlled °CMP °Continue amlodipine 5 mg daily °

## 2022-04-29 NOTE — Progress Notes (Signed)
Subjective:    [name], female    DOB: 10-Jun-1955, 67 y.o.   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  She is not exercising.  She has not been eating as well as she should and she knows she needs to make changes.  Medications and allergies reviewed with patient and updated if appropriate.  Current Outpatient Medications on File Prior to Visit  Medication Sig Dispense Refill   AMARYL 2 MG tablet TAKE 1 TABLET DAILY BEFORE BREAKFAST. 90 tablet 3   amitriptyline (ELAVIL) 25 MG tablet TAKE 1 TABLET AT BEDTIME 90 tablet 2   amLODipine (NORVASC) 5 MG tablet Take 1 tablet (5 mg total) by mouth daily. 90 tablet 3   blood glucose meter kit and supplies KIT Dispense based on patient and insurance preference. Use up to four times daily as directed. (FOR E11.9). 1 each 0   cyclobenzaprine (FLEXERIL) 10 MG tablet Take 1 tablet (10 mg total) by mouth 3 (three) times daily as needed. 60 tablet 5   ezetimibe (ZETIA) 10 MG tablet Take 1 tablet (10 mg total) by mouth daily. 90 tablet 2   gabapentin (NEURONTIN) 300 MG capsule TAKE 1 CAPSULE BY MOUTH IN  THE MORNING , 1 CAPSULE IN  THE AFTERNOON , AND 4  CAPSULES AT BEDTIME 540 capsule 3   glucose blood (ONETOUCH ULTRA) test strip Use as directed to check blood sugar once daily E11.9 100 each 3   JARDIANCE 25 MG TABS tablet TAKE 1 TABLET DAILY BEFORE BREAKFAST (NEW DOSE) 90 tablet 2   Lancets (ONETOUCH DELICA PLUS 123XX123) MISC Use as directed to check blood sugar once daily E11.9 100 each 0   meloxicam (MOBIC) 15 MG tablet TAKE 1 TABLET DAILY 90 tablet 1   omeprazole (PRILOSEC) 20 MG capsule Take 1 capsule (20 mg total) by mouth daily. 90 capsule 3   No current facility-administered medications on file prior to visit.     Review of Systems  Constitutional:  Negative  for fever.  HENT:  Positive for postnasal drip.   Respiratory:  Positive for cough (dry). Negative for shortness of breath and wheezing.   Cardiovascular:  Negative for chest pain, palpitations and leg swelling.  Gastrointestinal:        Jerrye Bushy controlled  Musculoskeletal:        Leg cramping - at night  Neurological:  Positive for light-headedness (occ) and headaches (intermittent x 1 month). Negative for dizziness.       Objective:   Vitals:   04/29/22 1416  BP: 122/78  Pulse: 90  Temp: 98.2 F (36.8 C)  SpO2: 95%   BP Readings from Last 3 Encounters:  04/29/22 122/78  04/17/22 120/80  01/15/22 128/74   Wt Readings from Last 3 Encounters:  04/29/22 277 lb (125.6 kg)  04/17/22 278 lb (126.1 kg)  01/15/22 278 lb (126.1 kg)   Body mass index is 42.12 kg/m.    Physical Exam Constitutional:      General: She is not in acute distress.    Appearance: Normal appearance.  HENT:     Head: Normocephalic and atraumatic.  Eyes:     Conjunctiva/sclera: Conjunctivae normal.  Cardiovascular:     Rate and Rhythm: Normal rate and regular rhythm.     Heart sounds: Normal heart  sounds.  Pulmonary:     Effort: Pulmonary effort is normal. No respiratory distress.     Breath sounds: Normal breath sounds. No wheezing.  Musculoskeletal:     Cervical back: Neck supple.     Right lower leg: No edema.     Left lower leg: No edema.  Lymphadenopathy:     Cervical: No cervical adenopathy.  Skin:    General: Skin is warm and dry.     Findings: No rash.  Neurological:     Mental Status: She is alert. Mental status is at baseline.  Psychiatric:        Mood and Affect: Mood normal.        Behavior: Behavior normal.        Lab Results  Component Value Date   WBC 8.9 10/28/2021   HGB 14.5 10/28/2021   HCT 43.6 10/28/2021   PLT 264.0 10/28/2021   GLUCOSE 145 (H) 10/28/2021   CHOL 232 (H) 10/28/2021   TRIG 263.0 (H) 10/28/2021   HDL 46.10 10/28/2021   LDLDIRECT 143.0 10/28/2021    LDLCALC 127 (H) 07/29/2021   ALT 17 10/28/2021   AST 19 10/28/2021   NA 140 10/28/2021   K 4.1 10/28/2021   CL 102 10/28/2021   CREATININE 0.72 10/28/2021   BUN 15 10/28/2021   CO2 26 10/28/2021   TSH 2.13 06/19/2020   INR 1.00 04/07/2011   HGBA1C 8.8 (H) 10/28/2021   MICROALBUR 0.8 04/28/2021     Assessment & Plan:    See Problem List for Assessment and Plan of chronic medical problems.

## 2022-04-29 NOTE — Assessment & Plan Note (Signed)
Chronic   Lab Results  Component Value Date   HGBA1C 8.8 (H) 10/28/2021    Sugars not ideally controlled controlled Testing sugars 1 times a day-have been elevated-she has not been compliant with a diabetic diet and starting to make changes Check A1c, urine microalbumin today Continue Jardiance 25 mg daily, glimepiride 2 mg daily Stressed regular exercise, diabetic diet Encouraged weight loss

## 2022-04-29 NOTE — Assessment & Plan Note (Signed)
Chronic Using cpap nightly Follows with pulm

## 2022-06-03 ENCOUNTER — Encounter: Payer: Self-pay | Admitting: Internal Medicine

## 2022-06-04 ENCOUNTER — Other Ambulatory Visit: Payer: Self-pay

## 2022-06-04 MED ORDER — GLIMEPIRIDE 2 MG PO TABS: 2.0000 mg | ORAL_TABLET | Freq: Every day | ORAL | 3 refills | Status: DC

## 2022-06-30 ENCOUNTER — Other Ambulatory Visit: Payer: Self-pay | Admitting: Internal Medicine

## 2022-07-02 ENCOUNTER — Telehealth: Payer: Self-pay | Admitting: Radiology

## 2022-07-02 NOTE — Telephone Encounter (Signed)
Contacted Michelle Sanchez to schedule their annual wellness visit.  Left message for patient to call back at [phone] to schedule Medicare Annual Wellness Visit    Last AWV:  no Hx of AWV    Please schedule with [facility] AWV Nurse   Graylon Good. CMA

## 2022-07-06 ENCOUNTER — Encounter: Payer: Self-pay | Admitting: Internal Medicine

## 2022-07-06 ENCOUNTER — Other Ambulatory Visit: Payer: Self-pay

## 2022-07-06 MED ORDER — AMLODIPINE BESYLATE 5 MG PO TABS: 5.0000 mg | ORAL_TABLET | Freq: Every day | ORAL | 3 refills | Status: DC

## 2022-07-30 LAB — HM MAMMOGRAPHY

## 2022-07-30 LAB — HM DEXA SCAN

## 2022-09-10 LAB — HM DIABETES EYE EXAM

## 2022-09-30 ENCOUNTER — Encounter (INDEPENDENT_AMBULATORY_CARE_PROVIDER_SITE_OTHER): Payer: Self-pay

## 2022-10-09 ENCOUNTER — Encounter: Payer: Self-pay | Admitting: Internal Medicine

## 2022-10-09 MED ORDER — CYCLOBENZAPRINE HCL 10 MG PO TABS: 10.0000 mg | ORAL_TABLET | Freq: Three times a day (TID) | ORAL | 5 refills | Status: DC | PRN

## 2022-10-27 ENCOUNTER — Encounter: Payer: Self-pay | Admitting: Internal Medicine

## 2022-10-27 NOTE — Patient Instructions (Addendum)
      Blood work was ordered.   The lab is on the first floor.    Medications changes include :   start taking vitamin B12 1000 mcg daily     Return in about 6 months (around 04/30/2023) for follow up.

## 2022-10-27 NOTE — Progress Notes (Unsigned)
Subjective:    [name], female    DOB: [birth year], 67 y.o.   MRN: [id number]     HPI Michelle Sanchez is here for follow up of her chronic medical problems.  Has barefeet shoes - it is helping her neuropathy   Medications and allergies reviewed with patient and updated if appropriate.  Current Outpatient Medications on File Prior to Visit  Medication Sig Dispense Refill   amitriptyline (ELAVIL) 25 MG tablet TAKE 1 TABLET AT BEDTIME 90 tablet 2   amLODipine (NORVASC) 5 MG tablet Take 1 tablet (5 mg total) by mouth daily. 90 tablet 3   blood glucose meter kit and supplies KIT Dispense based on patient and insurance preference. Use up to four times daily as directed. (FOR E11.9). 1 each 0   cyclobenzaprine (FLEXERIL) 10 MG tablet Take 1 tablet (10 mg total) by mouth 3 (three) times daily as needed. 60 tablet 5   ezetimibe (ZETIA) 10 MG tablet TAKE 1 TABLET DAILY 90 tablet 2   gabapentin (NEURONTIN) 300 MG capsule TAKE 1 CAPSULE BY MOUTH IN  THE MORNING , 1 CAPSULE IN  THE AFTERNOON , AND 4  CAPSULES AT BEDTIME 540 capsule 3   glimepiride (AMARYL) 2 MG tablet Take 1 tablet (2 mg total) by mouth daily before breakfast. 90 tablet 3   glucose blood (ONETOUCH ULTRA) test strip Use as directed to check blood sugar once daily E11.9 100 each 3   JARDIANCE 25 MG TABS tablet TAKE 1 TABLET DAILY BEFORE BREAKFAST (NEW DOSE) 90 tablet 2   Lancets (ONETOUCH DELICA PLUS LANCET33G) MISC Use as directed to check blood sugar once daily E11.9 100 each 0   meloxicam (MOBIC) 15 MG tablet TAKE 1 TABLET DAILY 90 tablet 1   omeprazole (PRILOSEC) 20 MG capsule TAKE 1 CAPSULE DAILY 90 capsule 3   No current facility-administered medications on file prior to visit.     Review of Systems  Constitutional:  Negative for fever.  Respiratory:  Negative for cough, shortness of breath and wheezing.   Cardiovascular:  Negative for chest pain, palpitations and leg swelling.  Neurological:  Positive  for light-headedness (occ) and headaches (posterior).       Objective:   Vitals:   10/28/22 1258  BP: 120/74  Pulse: 90  Temp: 98 F (36.7 C)  SpO2: 96%   BP Readings from Last 3 Encounters:  10/28/22 120/74  04/29/22 122/78  04/17/22 120/80   Wt Readings from Last 3 Encounters:  10/28/22 275 lb (124.7 kg)  04/29/22 277 lb (125.6 kg)  04/17/22 278 lb (126.1 kg)   Body mass index is 41.81 kg/m.    Physical Exam Constitutional:      General: She is not in acute distress.    Appearance: Normal appearance.  HENT:     Head: Normocephalic and atraumatic.  Eyes:     Conjunctiva/sclera: Conjunctivae normal.  Cardiovascular:     Rate and Rhythm: Normal rate and regular rhythm.     Heart sounds: Normal heart sounds.  Pulmonary:     Effort: Pulmonary effort is normal. No respiratory distress.     Breath sounds: Normal breath sounds. No wheezing.  Musculoskeletal:     Cervical back: Neck supple.     Right lower leg: No edema.     Left lower leg: No edema.  Lymphadenopathy:     Cervical: No cervical adenopathy.  Skin:    General: Skin is warm and dry.  Findings: No rash.  Neurological:     Mental Status: She is alert. Mental status is at baseline.  Psychiatric:        Mood and Affect: Mood normal.        Behavior: Behavior normal.        Lab Results  Component Value Date   WBC 8.9 10/28/2021   HGB 14.5 10/28/2021   HCT 43.6 10/28/2021   PLT 264.0 10/28/2021   GLUCOSE 193 (H) 04/29/2022   CHOL 191 04/29/2022   TRIG 214.0 (H) 04/29/2022   HDL 48.30 04/29/2022   LDLDIRECT 126.0 04/29/2022   LDLCALC 127 (H) 07/29/2021   ALT 20 04/29/2022   AST 16 04/29/2022   NA 139 04/29/2022   K 4.7 04/29/2022   CL 102 04/29/2022   CREATININE 0.64 04/29/2022   BUN 13 04/29/2022   CO2 28 04/29/2022   TSH 2.13 06/19/2020   INR 1.00 04/07/2011   HGBA1C 9.3 (H) 04/29/2022   MICROALBUR 0.7 04/29/2022     Assessment & Plan:    See Problem List for Assessment and  Plan of chronic medical problems.

## 2022-10-28 ENCOUNTER — Ambulatory Visit (INDEPENDENT_AMBULATORY_CARE_PROVIDER_SITE_OTHER): Payer: Medicare Other | Admitting: Internal Medicine

## 2022-10-28 VITALS — BP 120/74 | HR 90 | Temp 98.0°F | Ht 68.0 in | Wt 275.0 lb

## 2022-10-28 DIAGNOSIS — Z794 Long term (current) use of insulin: Secondary | ICD-10-CM

## 2022-10-28 DIAGNOSIS — I10 Essential (primary) hypertension: Secondary | ICD-10-CM

## 2022-10-28 DIAGNOSIS — M48061 Spinal stenosis, lumbar region without neurogenic claudication: Secondary | ICD-10-CM

## 2022-10-28 DIAGNOSIS — E782 Mixed hyperlipidemia: Secondary | ICD-10-CM | POA: Diagnosis not present

## 2022-10-28 DIAGNOSIS — G629 Polyneuropathy, unspecified: Secondary | ICD-10-CM

## 2022-10-28 DIAGNOSIS — M810 Age-related osteoporosis without current pathological fracture: Secondary | ICD-10-CM | POA: Diagnosis not present

## 2022-10-28 DIAGNOSIS — I712 Thoracic aortic aneurysm, without rupture, unspecified: Secondary | ICD-10-CM

## 2022-10-28 DIAGNOSIS — K219 Gastro-esophageal reflux disease without esophagitis: Secondary | ICD-10-CM | POA: Diagnosis not present

## 2022-10-28 DIAGNOSIS — E1165 Type 2 diabetes mellitus with hyperglycemia: Secondary | ICD-10-CM

## 2022-10-28 LAB — COMPREHENSIVE METABOLIC PANEL
ALT: 22 U/L (ref 0–35)
AST: 18 U/L (ref 0–37)
Albumin: 4.4 g/dL (ref 3.5–5.2)
Alkaline Phosphatase: 98 U/L (ref 39–117)
BUN: 12 mg/dL (ref 6–23)
CO2: 25 meq/L (ref 19–32)
Calcium: 10.2 mg/dL (ref 8.4–10.5)
Chloride: 101 meq/L (ref 96–112)
Creatinine, Ser: 0.71 mg/dL (ref 0.40–1.20)
GFR: 88.29 mL/min (ref >60.00)
Glucose, Bld: 194 mg/dL — ABNORMAL HIGH (ref 70–99)
Potassium: 4.2 meq/L (ref 3.5–5.1)
Sodium: 136 meq/L (ref 135–145)
Total Bilirubin: 0.6 mg/dL (ref 0.2–1.2)
Total Protein: 8.2 g/dL (ref 6.0–8.3)

## 2022-10-28 LAB — LIPID PANEL
Cholesterol: 201 mg/dL — ABNORMAL HIGH (ref 0–200)
HDL: 43.2 mg/dL (ref >39.00)
NonHDL: 157.53
Total CHOL/HDL Ratio: 5
Triglycerides: 225 mg/dL — ABNORMAL HIGH (ref 0.0–149.0)
VLDL: 45 mg/dL — ABNORMAL HIGH (ref 0.0–40.0)

## 2022-10-28 LAB — LDL CHOLESTEROL, DIRECT: Direct LDL: 164 mg/dL

## 2022-10-28 LAB — HEMOGLOBIN A1C: Hgb A1c MFr Bld: 10.2 % — ABNORMAL HIGH (ref 4.6–6.5)

## 2022-10-28 MED ORDER — FREESTYLE LIBRE 14 DAY READER DEVI: 0 refills | Status: DC

## 2022-10-28 MED ORDER — FREESTYLE LIBRE 14 DAY SENSOR MISC: 5 refills | Status: DC

## 2022-10-28 NOTE — Assessment & Plan Note (Signed)
Chronic Regular exercise and healthy diet encouraged Check lipid panel  Continue Zetia 10 mg daily 

## 2022-10-28 NOTE — Assessment & Plan Note (Signed)
Chronic °Blood pressure well controlled °CMP °Continue amlodipine 5 mg daily °

## 2022-10-28 NOTE — Assessment & Plan Note (Signed)
Chronic GERD controlled Continue omeprazole 20 mg daily  

## 2022-10-28 NOTE — Assessment & Plan Note (Signed)
Seen on Ct scan - due for f/u this fall  Will refer to CTS

## 2022-10-28 NOTE — Assessment & Plan Note (Signed)
Chronic Continue meloxicam 15 mg daily-discussed possible side effects Continue gabapentin 300 mg in the morning, 300 mg in the afternoon and 1200 mg at bedtime

## 2022-10-28 NOTE — Assessment & Plan Note (Signed)
Chronic Related to back pain, but diabetes may start contributing Continue gabapentin 300 mg in the morning and afternoon and 1200 mg at bedtime Continue amitriptyline 25 mg at bedtime

## 2022-10-28 NOTE — Assessment & Plan Note (Signed)
Chronic   Lab Results  Component Value Date   HGBA1C 9.3 (H) 04/29/2022    Sugars not ideally controlled controlled Testing sugars 1 times a day Check A1c Continue Jardiance 25 mg daily, glimepiride 2 mg daily Stressed regular exercise, diabetic diet Encouraged weight loss

## 2022-10-29 ENCOUNTER — Ambulatory Visit (INDEPENDENT_AMBULATORY_CARE_PROVIDER_SITE_OTHER): Payer: Medicare Other | Admitting: *Deleted

## 2022-10-29 VITALS — BP 126/84 | HR 104 | Ht 68.0 in | Wt 275.0 lb

## 2022-10-29 DIAGNOSIS — Z0000 Encounter for general adult medical examination without abnormal findings: Secondary | ICD-10-CM

## 2022-10-29 LAB — VITAMIN D 25 HYDROXY (VIT D DEFICIENCY, FRACTURES): VITD: 41.57 ng/mL (ref 30.00–100.00)

## 2022-10-29 LAB — TSH: TSH: 1.22 u[IU]/mL (ref 0.35–5.50)

## 2022-10-29 NOTE — Patient Instructions (Signed)

## 2022-10-29 NOTE — Progress Notes (Signed)
Subjective:   Michelle Sanchez is a 67 y.o. female who presents for an Initial Medicare Annual Wellness Visit.  Visit Complete: In person  Patient Medicare AWV questionnaire was completed by the patient on 10/28/2022; I have confirmed that all information answered by patient is correct and no changes since this date.  Review of Systems    Defer to PCP  Cardiac Risk Factors include: advanced age (>79men, >22 women)    Please see problem list for additional risk factors Objective:    Today's Vitals   10/29/22 1447 10/29/22 1453  BP: 126/84   Pulse: (!) 104   TempSrc: Oral   SpO2: 98%   Weight: 275 lb (124.7 kg)   Height: 5\' 8"  (1.727 m)   PainSc: 5  5    Body mass index is 41.81 kg/m.     10/29/2022    3:01 PM 07/30/2021    7:35 PM 08/08/2014   12:54 PM 04/13/2011   11:00 AM 04/07/2011    2:50 PM  Advanced Directives  Does Patient Have a Medical Advance Directive? No No No Patient would not like information;Patient does not have advance directive Patient does not have advance directive  Would patient like information on creating a medical advance directive? No - Patient declined No - Patient declined     Pre-existing out of facility DNR order (yellow form or pink MOST form)    No No    Current Medications (verified) Outpatient Encounter Medications as of 10/29/2022  Medication Sig   amitriptyline (ELAVIL) 25 MG tablet TAKE 1 TABLET AT BEDTIME   amLODipine (NORVASC) 5 MG tablet Take 1 tablet (5 mg total) by mouth daily.   blood glucose meter kit and supplies KIT Dispense based on patient and insurance preference. Use up to four times daily as directed. (FOR E11.9).   Continuous Glucose Receiver (FREESTYLE LIBRE 14 DAY READER) DEVI UAD to check sugars.  E11.65   Continuous Glucose Sensor (FREESTYLE LIBRE 14 DAY SENSOR) MISC UAD to check sugars.  E11.65   cyclobenzaprine (FLEXERIL) 10 MG tablet Take 1 tablet (10 mg total) by mouth 3 (three) times daily as needed.   ezetimibe  (ZETIA) 10 MG tablet TAKE 1 TABLET DAILY   gabapentin (NEURONTIN) 300 MG capsule TAKE 1 CAPSULE BY MOUTH IN  THE MORNING , 1 CAPSULE IN  THE AFTERNOON , AND 4  CAPSULES AT BEDTIME   glimepiride (AMARYL) 2 MG tablet Take 1 tablet (2 mg total) by mouth daily before breakfast.   glucose blood (ONETOUCH ULTRA) test strip Use as directed to check blood sugar once daily E11.9   JARDIANCE 25 MG TABS tablet TAKE 1 TABLET DAILY BEFORE BREAKFAST (NEW DOSE)   Lancets (ONETOUCH DELICA PLUS LANCET33G) MISC Use as directed to check blood sugar once daily E11.9   meloxicam (MOBIC) 15 MG tablet TAKE 1 TABLET DAILY   omeprazole (PRILOSEC) 20 MG capsule TAKE 1 CAPSULE DAILY   No facility-administered encounter medications on file as of 10/29/2022.    Allergies (verified) Diclofenac, Ozempic (0.25 or 0.5 mg-dose) [semaglutide(0.25 or 0.5mg -dos)], Prednisone, Diclofenac sodium, Metformin and related, Septra [bactrim], and Trimethoprim   History: Past Medical History:  Diagnosis Date   Allergic rhinitis    Allergy    Arthritis     knee s/p TKR 2/13   Depression    GERD (gastroesophageal reflux disease)    Hypertension    Migraines    OSA (obstructive sleep apnea)    uses cpap nightly   Shingles  outbreak 12/2013   L arm/torso   Sleep apnea    cpap   Spinal stenosis    Past Surgical History:  Procedure Laterality Date   Knee replacement Left 2012   KNEE SURGERY     both knees   TOTAL KNEE ARTHROPLASTY  04/13/2011   Procedure: TOTAL KNEE ARTHROPLASTY;  Surgeon: Loanne Drilling, MD;  Location: [facility];  Service: Orthopedics;  Laterality: Left;   Family History  Problem Relation Age of Onset   Arthritis Mother    Breast cancer Mother    Emphysema Mother    Asthma Mother    Heart disease Mother    Arthritis Father    Cancer Father    Alcohol abuse Other    Diabetes Other    Colon cancer Neg Hx    Social History   Socioeconomic History   Marital status: Single    Spouse name: Not on file    Number of children: Not on file   Years of education: Not on file   Highest education level: Not on file  Occupational History   Occupation: retired  Tobacco Use   Smoking status: Never   Smokeless tobacco: Never  Substance and Sexual Activity   Alcohol use: Yes    Alcohol/week: 1.0 standard drink of alcohol    Types: 1 Glasses of wine per week    Comment: rare social   Drug use: No   Sexual activity: Yes  Other Topics Concern   Not on file  Social History Narrative   Not on file   Social Determinants of Health   Financial Resource Strain: Low Risk  (10/29/2022)   Overall Financial Resource Strain (CARDIA)    Difficulty of Paying Living Expenses: Not hard at all  Food Insecurity: Unknown (10/29/2022)   Hunger Vital Sign    Worried About Running Out of Food in the Last Year: Never true    Ran Out of Food in the Last Year: Not on file  Transportation Needs: No Transportation Needs (10/29/2022)   PRAPARE - Administrator, Civil Service (Medical): No    Lack of Transportation (Non-Medical): No  Physical Activity: Sufficiently Active (10/29/2022)   Exercise Vital Sign    Days of Exercise per Week: 5 days    Minutes of Exercise per Session: 30 min  Stress: No Stress Concern Present (10/29/2022)   [employer] of Occupational Health - Occupational Stress Questionnaire    Feeling of Stress : Not at all  Social Connections: Socially Isolated (10/29/2022)   Social Connection and Isolation Panel [NHANES]    Frequency of Communication with Friends and Family: Once a week    Frequency of Social Gatherings with Friends and Family: Once a week    Attends Religious Services: Never    Database administrator or Organizations: No    Attends Engineer, structural: Never    Marital Status: Living with partner    Tobacco Counseling Counseling given: Not Answered   Clinical Intake:  Pre-visit preparation completed: Yes  Pain : 0-10 Pain Score: 5  Pain  Location: Foot Pain Orientation: Right, Left Pain Descriptors / Indicators: Tingling, Throbbing     Nutritional Risks: None Diabetes: Yes CBG done?: No Did pt. bring in CBG monitor from home?: No  How often do you need to have someone help you when you read instructions, pamphlets, or other written materials from your doctor or pharmacy?: 1 - Never What is the last grade level you completed in  school?: 12 th grade  Interpreter Needed?: No      Activities of Daily Living    10/29/2022    2:46 PM 10/28/2022    5:40 PM  In your present state of health, do you have any difficulty performing the following activities:  Hearing? 0 0  Vision? 0 0  Difficulty concentrating or making decisions? 0 0  Walking or climbing stairs? 0 0  Dressing or bathing? 0 0  Doing errands, shopping? 0 0  Preparing Food and eating ? N N  Using the Toilet? N N  In the past six months, have you accidently leaked urine? N N  Do you have problems with loss of bowel control? N N  Managing your Medications? N N  Managing your Finances? N N  Housekeeping or managing your Housekeeping? N N    Patient Care Team: Pincus Sanes, MD as PCP - General (Internal Medicine) Ollen Gross, MD (Orthopedic Surgery) Harold Hedge, MD (Obstetrics and Gynecology) Shelle Iron Maree Krabbe, MD (Sleep Medicine)  Indicate any recent Medical Services you may have received from other than [facility] providers in the past year (date may be approximate).     Assessment:   This is a routine wellness examination for Rhaegan.  Hearing/Vision screen Vision Screening - Comments:: Annual eye exam Dr. Marti Sleigh   Dietary issues and exercise activities discussed:     Goals Addressed               This Visit's Progress     Patient Stated (pt-stated)        Patient wants to lose at least 100 lbs, get her A1c down and lipids down, Overall health       Depression Screen    10/29/2022    3:04 PM 10/29/2022    2:59 PM 10/28/2022     1:05 PM 04/29/2022    2:26 PM 10/28/2021    2:43 PM 08/04/2021    9:26 AM 04/29/2021    9:57 AM  PHQ 2/9 Scores  PHQ - 2 Score 0 0 0 0 0 2 0  PHQ- 9 Score   1 0 0 5     Fall Risk    10/29/2022    2:56 PM 10/28/2022    5:40 PM 10/28/2022    1:05 PM 04/29/2022    2:26 PM 10/28/2021    2:42 PM  Fall Risk   Falls in the past year? 0 0 0 0 0  Number falls in past yr: 0 0 0 0 0  Injury with Fall? 0  0 0 0  Risk for fall due to : History of fall(s)  No Fall Risks No Fall Risks No Fall Risks  Follow up Falls evaluation completed  Falls evaluation completed Falls evaluation completed Falls evaluation completed    MEDICARE RISK AT HOME: Medicare Risk at Home Any stairs in or around the home?: No If so, are there any without handrails?: No Home free of loose throw rugs in walkways, pet beds, electrical cords, etc?: Yes Adequate lighting in your home to reduce risk of falls?: Yes Life alert?: No Use of a cane, walker or w/c?: Yes (occcasionally) Grab bars in the bathroom?: Yes Shower chair or bench in shower?: No Elevated toilet seat or a handicapped toilet?: No  TIMED UP AND GO:  Was the test performed? Yes  Length of time to ambulate 10 feet: 6 sec Gait steady and fast without use of assistive device    Cognitive Function:  10/29/2022    3:03 PM  6CIT Screen  What Year? 0 points  What month? 0 points  What time? 0 points  Count back from 20 0 points  Months in reverse 0 points  Repeat phrase 0 points  Total Score 0 points    Immunizations Immunization History  Administered Date(s) Administered   Influenza Split 12/14/2013   Influenza,inj,Quad PF,6+ Mos 12/31/2015, 12/26/2016, 01/17/2018, 01/10/2019   PNEUMOCOCCAL CONJUGATE-20 04/28/2021   Pneumococcal Polysaccharide-23 10/25/2017   Tdap 01/19/2012   Zoster, Live 02/12/2016    TDAP status: Due, Education has been provided regarding the importance of this vaccine. Advised may receive this vaccine at local pharmacy  or Health Dept. Aware to provide a copy of the vaccination record if obtained from local pharmacy or Health Dept. Verbalized acceptance and understanding.  Flu Vaccine status: Due, Education has been provided regarding the importance of this vaccine. Advised may receive this vaccine at local pharmacy or Health Dept. Aware to provide a copy of the vaccination record if obtained from local pharmacy or Health Dept. Verbalized acceptance and understanding.  Pneumococcal vaccine status: Up to date  Covid-19 vaccine status: Completed vaccines  Qualifies for Shingles Vaccine? Yes   Zostavax completed No   Shingrix Completed?: No.    Education has been provided regarding the importance of this vaccine. Patient has been advised to call insurance company to determine out of pocket expense if they have not yet received this vaccine. Advised may also receive vaccine at local pharmacy or Health Dept. Verbalized acceptance and understanding.  Screening Tests Health Maintenance  Topic Date Due   MAMMOGRAM  11/01/2015   OPHTHALMOLOGY EXAM  08/10/2018   FOOT EXAM  01/10/2020   DEXA SCAN  Never done   INFLUENZA VACCINE  05/31/2023 (Originally 10/01/2022)   Zoster Vaccines- Shingrix (1 of 2) 06/29/2023 (Originally 11/22/2005)   Diabetic kidney evaluation - Urine ACR  04/30/2023   HEMOGLOBIN A1C  04/30/2023   Diabetic kidney evaluation - eGFR measurement  10/28/2023   Medicare Annual Wellness (AWV)  10/29/2023   Colonoscopy  08/21/2024   Pneumonia Vaccine 36+ Years old  Completed   Hepatitis C Screening  Completed   HPV VACCINES  Aged Out   DTaP/Tdap/Td  Discontinued   COVID-19 Vaccine  Discontinued    Health Maintenance  Health Maintenance Due  Topic Date Due   MAMMOGRAM  11/01/2015   OPHTHALMOLOGY EXAM  08/10/2018   FOOT EXAM  01/10/2020   DEXA SCAN  Never done    Colorectal cancer screening: Type of screening: Colonoscopy. Completed 08/22/2014. Repeat every 10 years  Mammogram status:  Completed Patient states she done it at her GYN office, awaiting report. Repeat every year  Bone Density status: Completed patient states she got her test done at her GYN office. Awaiting report. Results reflect: Bone density results: OSTEOPOROSIS. Repeat every 2 years.  Lung Cancer Screening: (Low Dose CT Chest recommended if Age 66-80 years, 20 pack-year currently smoking OR have quit w/in 15years.) does not qualify.   Lung Cancer Screening Referral: n/a  Additional Screening:  Hepatitis C Screening: does qualify; Completed 04/13/2016  Vision Screening: Recommended annual ophthalmology exams for early detection of glaucoma and other disorders of the eye. Is the patient up to date with their annual eye exam?  Yes  Who is the provider or what is the name of the office in which the patient attends annual eye exams? [facility] If pt is not established with a provider, would they like to be  referred to a provider to establish care? No .   Dental Screening: Recommended annual dental exams for proper oral hygiene  Diabetic Foot Exam: Diabetic Foot Exam: Overdue, Pt has been advised about the importance in completing this exam. Pt is scheduled for diabetic foot exam on 04/30/2023.  Community Resource Referral / Chronic Care Management: CRR required this visit?  No   CCM required this visit?  No     Plan:     I have personally reviewed and noted the following in the patient's chart:   Medical and social history Use of alcohol, tobacco or illicit drugs  Current medications and supplements including opioid prescriptions. Patient is not currently taking opioid prescriptions. Functional ability and status Nutritional status Physical activity Advanced directives List of other physicians Hospitalizations, surgeries, and ER visits in previous 12 months Vitals Screenings to include cognitive, depression, and falls Referrals and appointments  In addition, I have reviewed and discussed  with patient certain preventive protocols, quality metrics, and best practice recommendations. A written personalized care plan for preventive services as well as general preventive health recommendations were provided to patient.     Tamela Oddi, CMA   10/29/2022   Non face to face 30 mins  After Visit Summary: (MyChart) Due to this being a telephonic visit, the after visit summary with patients personalized plan was offered to patient via MyChart   Nurse Notes:  Ms. [place] , Thank you for taking time to come for your Medicare Wellness Visit. I appreciate your ongoing commitment to your health goals. Please review the following plan we discussed and let me know if I can assist you in the future.   These are the goals we discussed:  Goals       Patient Stated (pt-stated)      Patient wants to lose at least 100 lbs, get her A1c down and lipids down, Overall health         This is a list of the screening recommended for you and due dates:  Health Maintenance  Topic Date Due   Mammogram  11/01/2015   Eye exam for diabetics  08/10/2018   Complete foot exam   01/10/2020   DEXA scan (bone density measurement)  Never done   Flu Shot  05/31/2023*   Zoster (Shingles) Vaccine (1 of 2) 06/29/2023*   Yearly kidney health urinalysis for diabetes  04/30/2023   Hemoglobin A1C  04/30/2023   Yearly kidney function blood test for diabetes  10/28/2023   Medicare Annual Wellness Visit  10/29/2023   Colon Cancer Screening  08/21/2024   Pneumonia Vaccine  Completed   Hepatitis C Screening  Completed   HPV Vaccine  Aged Out   DTaP/Tdap/Td vaccine  Discontinued   COVID-19 Vaccine  Discontinued  *Topic was postponed. The date shown is not the original due date.

## 2022-11-02 ENCOUNTER — Encounter: Payer: Self-pay | Admitting: Internal Medicine

## 2022-11-03 ENCOUNTER — Ambulatory Visit (INDEPENDENT_AMBULATORY_CARE_PROVIDER_SITE_OTHER): Payer: Medicare Other | Admitting: Pulmonary Disease

## 2022-11-03 ENCOUNTER — Encounter: Payer: Self-pay | Admitting: Pulmonary Disease

## 2022-11-03 VITALS — BP 118/76 | HR 104 | Ht 68.0 in | Wt 274.6 lb

## 2022-11-03 DIAGNOSIS — G4733 Obstructive sleep apnea (adult) (pediatric): Secondary | ICD-10-CM

## 2022-11-03 NOTE — Patient Instructions (Signed)
I will see you a year from now  Good luck with your weight loss efforts  Continue using your CPAP nightly  Download from your machine shows it is working well  You are welcome to call us with any significant concerns

## 2022-11-03 NOTE — Progress Notes (Signed)
Michelle Sanchez    [id number]    [birth year]  Primary Care Physician:Burns, Bobette Mo, MD  Referring Physician: Pincus Sanes, MD [address] [place],  [place] [address]  Chief complaint:   Patient being seen for obstructive sleep apnea has been on CPAP for over 20 years   HPI:  Has been using CPAP on a nightly basis  Not having any problems with the CPAP  She is trying to lose weight  Waking up feeling like she is not a good nights rest Does not have any significant concerns about sleep apnea  Falls asleep quickly, wakes up between 7 and 7:30 in the morning feeling rested  She is being treated for severe obstructive sleep apnea and seems to be doing well  Wakes up feeling like she is at a good nights rest No significant dryness of the mouth in the morning No morning headaches Both parents snored  History of hypertension, diabetes-well-controlled  Never smoker  History of lung nodule  Outpatient Encounter Medications as of 11/03/2022  Medication Sig   amitriptyline (ELAVIL) 25 MG tablet TAKE 1 TABLET AT BEDTIME   amLODipine (NORVASC) 5 MG tablet Take 1 tablet (5 mg total) by mouth daily.   blood glucose meter kit and supplies KIT Dispense based on patient and insurance preference. Use up to four times daily as directed. (FOR E11.9).   Continuous Glucose Receiver (FREESTYLE LIBRE 14 DAY READER) DEVI UAD to check sugars.  E11.65   Continuous Glucose Sensor (FREESTYLE LIBRE 14 DAY SENSOR) MISC UAD to check sugars.  E11.65   cyclobenzaprine (FLEXERIL) 10 MG tablet Take 1 tablet (10 mg total) by mouth 3 (three) times daily as needed.   ezetimibe (ZETIA) 10 MG tablet TAKE 1 TABLET DAILY   gabapentin (NEURONTIN) 300 MG capsule TAKE 1 CAPSULE BY MOUTH IN  THE MORNING , 1 CAPSULE IN  THE AFTERNOON , AND 4  CAPSULES AT BEDTIME   glimepiride (AMARYL) 2 MG tablet Take 1 tablet (2 mg total) by mouth daily before breakfast.   glucose blood (ONETOUCH ULTRA) test strip  Use as directed to check blood sugar once daily E11.9   JARDIANCE 25 MG TABS tablet TAKE 1 TABLET DAILY BEFORE BREAKFAST (NEW DOSE)   Lancets (ONETOUCH DELICA PLUS LANCET33G) MISC Use as directed to check blood sugar once daily E11.9   meloxicam (MOBIC) 15 MG tablet TAKE 1 TABLET DAILY   omeprazole (PRILOSEC) 20 MG capsule TAKE 1 CAPSULE DAILY   No facility-administered encounter medications on file as of 11/03/2022.    Allergies as of 11/03/2022 - Review Complete 11/03/2022  Allergen Reaction Noted   Diclofenac Hives 03/28/2014   Ozempic (0.25 or 0.5 mg-dose) [semaglutide(0.25 or 0.5mg -dos)]  08/04/2021   Prednisone  04/07/2011   Diclofenac sodium Itching 02/18/2011   Metformin and related Nausea Only 01/03/2019   Septra [bactrim] Diarrhea 02/18/2011   Trimethoprim Diarrhea 06/24/2020    Past Medical History:  Diagnosis Date   Allergic rhinitis    Allergy    Arthritis     knee s/p TKR 2/13   Depression    GERD (gastroesophageal reflux disease)    Hypertension    Migraines    OSA (obstructive sleep apnea)    uses cpap nightly   Shingles outbreak 12/2013   L arm/torso   Sleep apnea    cpap   Spinal stenosis     Past Surgical History:  Procedure Laterality Date   Knee replacement  Left 2012   KNEE SURGERY     both knees   TOTAL KNEE ARTHROPLASTY  04/13/2011   Procedure: TOTAL KNEE ARTHROPLASTY;  Surgeon: Loanne Drilling, MD;  Location: [facility];  Service: Orthopedics;  Laterality: Left;    Family History  Problem Relation Age of Onset   Arthritis Mother    Breast cancer Mother    Emphysema Mother    Asthma Mother    Heart disease Mother    Arthritis Father    Cancer Father    Alcohol abuse Other    Diabetes Other    Colon cancer Neg Hx     Social History   Socioeconomic History   Marital status: Single    Spouse name: Not on file   Number of children: Not on file   Years of education: Not on file   Highest education level: Not on file  Occupational  History   Occupation: retired  Tobacco Use   Smoking status: Never   Smokeless tobacco: Never  Substance and Sexual Activity   Alcohol use: Yes    Alcohol/week: 1.0 standard drink of alcohol    Types: 1 Glasses of wine per week    Comment: rare social   Drug use: No   Sexual activity: Yes  Other Topics Concern   Not on file  Social History Narrative   Not on file   Social Determinants of Health   Financial Resource Strain: Low Risk  (10/29/2022)   Overall Financial Resource Strain (CARDIA)    Difficulty of Paying Living Expenses: Not hard at all  Food Insecurity: Unknown (10/29/2022)   Hunger Vital Sign    Worried About Running Out of Food in the Last Year: Never true    Ran Out of Food in the Last Year: Not on file  Transportation Needs: No Transportation Needs (10/29/2022)   PRAPARE - Administrator, Civil Service (Medical): No    Lack of Transportation (Non-Medical): No  Physical Activity: Sufficiently Active (10/29/2022)   Exercise Vital Sign    Days of Exercise per Week: 5 days    Minutes of Exercise per Session: 30 min  Stress: No Stress Concern Present (10/29/2022)   [employer] of Occupational Health - Occupational Stress Questionnaire    Feeling of Stress : Not at all  Social Connections: Socially Isolated (10/29/2022)   Social Connection and Isolation Panel [NHANES]    Frequency of Communication with Friends and Family: Once a week    Frequency of Social Gatherings with Friends and Family: Once a week    Attends Religious Services: Never    Database administrator or Organizations: No    Attends Banker Meetings: Never    Marital Status: Living with partner  Intimate Partner Violence: Not At Risk (10/29/2022)   Humiliation, Afraid, Rape, and Kick questionnaire    Fear of Current or Ex-Partner: No    Emotionally Abused: No    Physically Abused: No    Sexually Abused: No    Review of Systems  Constitutional:  Negative for fatigue.   Respiratory:  Positive for apnea.   Psychiatric/Behavioral:  Positive for sleep disturbance.     Vitals:   11/03/22 1611  BP: 118/76  Pulse: (!) 104  SpO2: 93%     Physical Exam Constitutional:      Appearance: She is obese.  HENT:     Head: Normocephalic.     Mouth/Throat:     Mouth: Mucous membranes are moist.  Comments: Mallampati 3, crowded oropharynx Eyes:     General: No scleral icterus.    Pupils: Pupils are equal, round, and reactive to light.  Cardiovascular:     Rate and Rhythm: Normal rate and regular rhythm.     Pulses: Normal pulses.     Heart sounds: No murmur heard.    No friction rub.  Pulmonary:     Effort: No respiratory distress.     Breath sounds: No stridor. No wheezing or rhonchi.  Musculoskeletal:     Cervical back: No rigidity or tenderness.  Neurological:     Mental Status: She is alert.  Psychiatric:        Mood and Affect: Mood normal.       11/18/2021    2:00 PM  Results of the Epworth flowsheet  Sitting and reading 0  Watching TV 0  Sitting, inactive in a public place (e.g. a theatre or a meeting) 0  As a passenger in a car for an hour without a break 0  Lying down to rest in the afternoon when circumstances permit 2  Sitting and talking to someone 0  Sitting quietly after a lunch without alcohol 0  In a car, while stopped for a few minutes in traffic 0  Total score 2   Data Reviewed: Compliance download from the machine shows 100% compliance Average use of 9 hours AutoSet 5-20 95 percentile pressure of 13.3, occasional leaks AHI of 5.0   Most recent sleep study 12/15/2021 showing severe obstructive sleep apnea  Assessment:  Severe obstructive sleep apnea -Appears well treated with CPAP therapy at current settings  Class III obesity -Working on weight loss  Diabetes Hypertension -Well-controlled -Being monitored  Lung nodule being followed -Last CT scan November 2023 showed stable  findings  Plan/Recommendations:   Continue CPAP on a nightly basis  Follow-up in a year  Encouraged to call with significant concerns  Graded activities as tolerated  Virl Diamond MD [facility] 11/03/2022, 4:17 PM  CC: Pincus Sanes, MD

## 2022-11-05 ENCOUNTER — Ambulatory Visit (HOSPITAL_COMMUNITY): Payer: Medicare Other

## 2022-11-05 ENCOUNTER — Other Ambulatory Visit: Payer: Self-pay | Admitting: Internal Medicine

## 2022-11-23 NOTE — Progress Notes (Unsigned)
PCP is Burns, Bobette Mo, MD Referring Provider is Pincus Sanes, MD  Reason for Consult: Evaluation of thoracic aortic aneurysm.  HPI: Michelle Sanchez is a 67 year old female with a past history notable for hypertension, type 2 diabetes mellitus, obstructive sleep apnea treated with CPAP for 20 years, gastroesophageal reflux disease, dyslipidemia, and obesity.  She is followed by [facility] pulmonary clinic for bilateral pulmonary nodules that have been stable on serial CT imaging.  She was also noted to have a noncontrast CT scan obtained in June of last year.  On a follow-up CT in November of last year, the aneurysm measuring 4.4 cm.  She is referred to CT surgery for evaluation and surveillance of this lesion. [name] denies having any chest pain.  She has been participating in [employer] in recent months and has lost some weight.  She thinks her mother may have had an aneurysm.  Past Medical History:  Diagnosis Date   Allergic rhinitis    Allergy    Arthritis     knee s/p TKR 2/13   Depression    GERD (gastroesophageal reflux disease)    Hypertension    Migraines    OSA (obstructive sleep apnea)    uses cpap nightly   Shingles outbreak 12/2013   L arm/torso   Sleep apnea    cpap   Spinal stenosis     Past Surgical History:  Procedure Laterality Date   Knee replacement Left 2012   KNEE SURGERY     both knees   TOTAL KNEE ARTHROPLASTY  04/13/2011   Procedure: TOTAL KNEE ARTHROPLASTY;  Surgeon: Loanne Drilling, MD;  Location: [facility];  Service: Orthopedics;  Laterality: Left;    Family History  Problem Relation Age of Onset   Arthritis Mother    Breast cancer Mother    Emphysema Mother    Asthma Mother    Heart disease Mother    Arthritis Father    Cancer Father    Alcohol abuse Other    Diabetes Other    Colon cancer Neg Hx     Social History Social History   Tobacco Use   Smoking status: Never   Smokeless tobacco: Never  Substance Use Topics   Alcohol use:  Yes    Alcohol/week: 1.0 standard drink of alcohol    Types: 1 Glasses of wine per week    Comment: rare social   Drug use: No    Current Outpatient Medications  Medication Sig Dispense Refill   amitriptyline (ELAVIL) 25 MG tablet TAKE 1 TABLET AT BEDTIME 90 tablet 2   amLODipine (NORVASC) 5 MG tablet Take 1 tablet (5 mg total) by mouth daily. 90 tablet 3   blood glucose meter kit and supplies KIT Dispense based on patient and insurance preference. Use up to four times daily as directed. (FOR E11.9). 1 each 0   Continuous Glucose Receiver (FREESTYLE LIBRE 14 DAY READER) DEVI UAD to check sugars.  E11.65 1 each 0   Continuous Glucose Sensor (FREESTYLE LIBRE 14 DAY SENSOR) MISC UAD to check sugars.  E11.65 2 each 5   cyclobenzaprine (FLEXERIL) 10 MG tablet Take 1 tablet (10 mg total) by mouth 3 (three) times daily as needed. 60 tablet 5   ezetimibe (ZETIA) 10 MG tablet TAKE 1 TABLET DAILY 90 tablet 2   gabapentin (NEURONTIN) 300 MG capsule TAKE 1 CAPSULE BY MOUTH IN  THE MORNING , 1 CAPSULE IN  THE AFTERNOON , AND 4  CAPSULES AT BEDTIME 540  capsule 3   glimepiride (AMARYL) 2 MG tablet Take 1 tablet (2 mg total) by mouth daily before breakfast. 90 tablet 3   glucose blood (ONETOUCH ULTRA) test strip Use as directed to check blood sugar once daily E11.9 100 each 3   JARDIANCE 25 MG TABS tablet TAKE 1 TABLET DAILY BEFORE BREAKFAST (NEW DOSE) 90 tablet 2   Lancets (ONETOUCH DELICA PLUS LANCET33G) MISC Use as directed to check blood sugar once daily E11.9 100 each 0   meloxicam (MOBIC) 15 MG tablet TAKE 1 TABLET DAILY 90 tablet 1   omeprazole (PRILOSEC) 20 MG capsule TAKE 1 CAPSULE DAILY 90 capsule 3   No current facility-administered medications for this visit.    Allergies  Allergen Reactions   Diclofenac Hives   Ozempic (0.25 Or 0.5 Mg-Dose) [Semaglutide(0.25 Or 0.5mg -Dos)]     pancreatitis   Prednisone     Migraines   Diclofenac Sodium Itching   Metformin And Related Nausea Only     GI upset   Septra [Bactrim] Diarrhea   Trimethoprim Diarrhea    Review of Systems: Review of Systems  Constitutional:  Positive for weight loss.       Intentional  HENT:  Positive for hearing loss.   Eyes: Negative.   Respiratory:  Positive for cough.        Sleep apnea, uses CPAP at night  Cardiovascular: Negative.   Gastrointestinal: Negative.   Genitourinary: Negative.   Musculoskeletal:  Positive for joint pain.       Arthritis  Neurological:  Positive for dizziness.       Peripheral neuropathy  Endo/Heme/Allergies: Negative.   Psychiatric/Behavioral: Negative.       Physical Exam: Vital signs BP 128/84 Pulse 97 Respirations 18 SpO2 94% on room air  General: Very pleasant 67 year old female in no distress.  Skin is warm and dry.  She walks with stable gait. Neck: Trachea midline.  No JVD.  No carotid bruit. Heart: Regular rate and rhythm, no murmur Chest: Breath sounds are clear to auscultation Extremities: No peripheral edema, no deformity, all are warm Neuro: Grossly intact   Diagnostic Tests:  CLINICAL DATA:  Pulmonary nodules.   EXAM: CT CHEST WITHOUT CONTRAST  01/16/2022   TECHNIQUE: Multidetector CT imaging of the chest was performed following the standard protocol without IV contrast.   RADIATION DOSE REDUCTION: This exam was performed according to the departmental dose-optimization program which includes automated exposure control, adjustment of the mA and/or kV according to patient size and/or use of iterative reconstruction technique.   COMPARISON:  08/26/2021   FINDINGS: Cardiovascular: The heart size is normal. No substantial pericardial effusion. Ascending thoracic aorta measures up to 4.4 cm diameter. Mild atherosclerotic calcification is noted in the wall of the thoracic aorta.   Mediastinum/Nodes: No mediastinal lymphadenopathy. No evidence for gross hilar lymphadenopathy although assessment is limited by the lack of intravenous  contrast on the current study. The esophagus has normal imaging features. There is no axillary lymphadenopathy.   Lungs/Pleura: Right middle lobe pulmonary nodule is stable at 12 mm. Index calcified nodule left lower lobe is unchanged at 9 mm (109/4). Posterior right lower lobe pulmonary nodule also stable at 11 mm today (111/4). None of these nodules demonstrate hypermetabolism on PET-CT of 10/01/2021. No new suspicious pulmonary nodule or mass. No focal airspace consolidation. No pleural effusion.   Upper Abdomen: The liver shows diffusely decreased attenuation suggesting fat deposition. Noncalcified gallstones seen in the neck of the gallbladder (152/2).   Musculoskeletal: No worrisome  lytic or sclerotic osseous abnormality.   IMPRESSION: 1. Stable bilateral pulmonary nodules since 08/26/2021. None of these nodules demonstrate hypermetabolism on PET-CT of 10/01/2021. CT at 12-18 months (from today's scan) is considered optional for low-risk patients, but is recommended for high-risk patients. This recommendation follows the consensus statement: Guidelines for Management of Incidental Pulmonary Nodules Detected on CT Images: From the Fleischner Society 2017; Radiology 2017; 284:228-243. 2. Probable hepatic steatosis. 3. Cholelithiasis. 4. Ascending thoracic aorta measures up to 4.4 cm diameter. Recommend annual imaging followup by CTA or MRA. This recommendation follows 2010 ACCF/AHA/AATS/ACR/ASA/SCA/SCAI/SIR/STS/SVM Guidelines for the Diagnosis and Management of Patients with Thoracic Aortic Disease. Circulation. 2010; 121: Z610-R604. Aortic aneurysm NOS (ICD10-I71.9) 5.  Aortic Atherosclerosis (ICD10-I70.0).     Electronically Signed   By: Kennith Center M.D.   On: 01/16/2022 08:02  Impression / Plan:  67 year old female with a past history notable for hypertension, type 2 diabetes mellitus, obstructive sleep apnea treated with CPAP for 20 years, gastroesophageal reflux  disease, dyslipidemia, stable pulmonary nodules followed by [place] [facility],  and obesity.  She was noted to have dilation of his thoracic aorta on CT imaging obtained for surveillance of the pulmonary nodules.  The most recent CT scan was 10 months ago.  No indication for surgical intervention.  The importance of ongoing surveillance is discussed with Ms. Dascoli.  We also discussed the importance of careful blood pressure management and avoidance of strenuous activity.  We also discussed indications for surgery and symptoms of dissection.   She will need follow-up imaging in 2 months (1 year after the last CT chest on 01/16/2022).  She has never had an echocardiogram which we will plan to get a baseline echo prior to return visit in November.   Leary Roca, PA-C [facility] [phone]

## 2022-11-24 ENCOUNTER — Institutional Professional Consult (permissible substitution) (INDEPENDENT_AMBULATORY_CARE_PROVIDER_SITE_OTHER): Payer: Medicare Other | Admitting: Physician Assistant

## 2022-11-24 VITALS — BP 128/84 | HR 97 | Resp 18 | Ht 68.0 in | Wt 270.0 lb

## 2022-11-24 DIAGNOSIS — I712 Thoracic aortic aneurysm, without rupture, unspecified: Secondary | ICD-10-CM | POA: Diagnosis not present

## 2022-11-24 NOTE — Patient Instructions (Signed)
Risk Modification in those with ascending thoracic aortic aneurysm:  Continue good control of blood pressure (prefer SBP 130/80 or less)-on Amlodipine,   2. Avoid fluoroquinolone antibiotics (I.e Ciprofloxacin, Avelox, Levofloxacin, Ofloxacin)  3.  Lipid control --on Zetia  4.  Exercise and activity limitations is individualized:  avoid heavy lifting (defined as half of ideal body weight) and exercises involving sustained Valsalva maneuver.  5. Follow up in 2 months (1 year after last imaging) with CTA chest.

## 2022-11-25 ENCOUNTER — Other Ambulatory Visit: Payer: Self-pay | Admitting: Internal Medicine

## 2022-11-27 ENCOUNTER — Ambulatory Visit: Payer: Medicare Other | Admitting: Acute Care

## 2022-12-02 ENCOUNTER — Other Ambulatory Visit: Payer: Self-pay

## 2022-12-02 DIAGNOSIS — R911 Solitary pulmonary nodule: Secondary | ICD-10-CM

## 2022-12-04 ENCOUNTER — Ambulatory Visit: Payer: Medicare Other | Admitting: Acute Care

## 2022-12-07 ENCOUNTER — Ambulatory Visit (HOSPITAL_BASED_OUTPATIENT_CLINIC_OR_DEPARTMENT_OTHER)
Admission: RE | Admit: 2022-12-07 | Discharge: 2022-12-07 | Disposition: A | Discharge status: Home / Self Care | Payer: Medicare Other | Source: Ambulatory Visit | Attending: Pulmonary Disease | Admitting: Pulmonary Disease

## 2022-12-07 DIAGNOSIS — R911 Solitary pulmonary nodule: Secondary | ICD-10-CM | POA: Insufficient documentation

## 2022-12-11 ENCOUNTER — Other Ambulatory Visit: Payer: Self-pay | Admitting: Thoracic Surgery (Cardiothoracic Vascular Surgery)

## 2022-12-11 DIAGNOSIS — I712 Thoracic aortic aneurysm, without rupture, unspecified: Secondary | ICD-10-CM

## 2022-12-15 ENCOUNTER — Encounter: Payer: Self-pay | Admitting: Acute Care

## 2022-12-15 ENCOUNTER — Ambulatory Visit (INDEPENDENT_AMBULATORY_CARE_PROVIDER_SITE_OTHER): Payer: Medicare Other | Admitting: Acute Care

## 2022-12-15 VITALS — BP 138/87 | HR 101 | Temp 97.5°F | Ht 68.0 in | Wt 269.0 lb

## 2022-12-15 DIAGNOSIS — I712 Thoracic aortic aneurysm, without rupture, unspecified: Secondary | ICD-10-CM

## 2022-12-15 DIAGNOSIS — R918 Other nonspecific abnormal finding of lung field: Secondary | ICD-10-CM

## 2022-12-15 DIAGNOSIS — G4733 Obstructive sleep apnea (adult) (pediatric): Secondary | ICD-10-CM | POA: Diagnosis not present

## 2022-12-15 NOTE — Progress Notes (Signed)
History of Present Illness Michelle Sanchez is a 67 y.o. female never smoker with OSA, Obesity , DM II and a lung nodule. She is followed by Dr. Wynona Sanchez and Dr. Tonia Sanchez.    12/15/2022 Pt. Presents for follow up after CT Chest to re-evaluate a pulmonary nodule for stability. Per Ct Chest Bilateral pulmonary nodules are stable and the main nodule of concern has calcified.  Per radiology more consistent with granuloma. Plan will be for a 12 month scan as surveillance for stability. She is also getting annual scanning through [facility] for an aneurysm. We will have her follow up 01/2024 after her next Ct Chest ordered by Thoracic Surgery so we can review the scan to evaluate pulmonary nodules. This way she only needs to be scanned once annually to monitor both nodules and aneurysm. She is in agreement with this plan. We discussed that BP control is very important to maintain stability of her aneurysm.    She states she is doing well with her CPAP therapy.  She is compliant and states she knows it is helping her a great deal.   Test Results: CT Chest 12/07/2022 Bilateral pulmonary nodules are stable, nodule of concern in the right lower lobe with new areas of calcification. Findings are consistent with benign granulomas. Dilated ascending thoracic aorta, measuring up to 4.4 cm. Recommend annual imaging followup by CTA or MRA.     Latest Ref Rng & Units 10/28/2021    3:22 PM 07/30/2021    7:37 PM 07/29/2021   11:45 AM  CBC  WBC 4.0 - 10.5 K/uL 8.9  11.5  17.0   Hemoglobin 12.0 - 15.0 g/dL 66.4  40.3  47.4   Hematocrit 36.0 - 46.0 % 43.6  45.8  45.5   Platelets 150.0 - 400.0 K/uL 264.0  411  342.0        Latest Ref Rng & Units 10/28/2022    1:34 PM 04/29/2022    2:43 PM 10/28/2021    3:22 PM  BMP  Glucose 70 - 99 mg/dL 259  563  875   BUN 6 - 23 mg/dL 12  13  15    Creatinine 0.40 - 1.20 mg/dL 6.43  3.29  5.18   Sodium 135 - 145 mEq/L 136  139  140   Potassium 3.5 - 5.1 mEq/L 4.2  4.7  4.1    Chloride 96 - 112 mEq/L 101  102  102   CO2 19 - 32 mEq/L 25  28  26    Calcium 8.4 - 10.5 mg/dL 84.1  66.0  9.9     BNP No results found for: "BNP"  ProBNP No results found for: "PROBNP"  PFT No results found for: "FEV1PRE", "FEV1POST", "FVCPRE", "FVCPOST", "TLC", "DLCOUNC", "PREFEV1FVCRT", "PSTFEV1FVCRT"  CT Chest Wo Contrast  Result Date: 12/14/2022 CLINICAL DATA:  Follow-up lung nodule EXAM: CT CHEST WITHOUT CONTRAST TECHNIQUE: Multidetector CT imaging of the chest was performed following the standard protocol without IV contrast. RADIATION DOSE REDUCTION: This exam was performed according to the departmental dose-optimization program which includes automated exposure control, adjustment of the mA and/or kV according to patient size and/or use of iterative reconstruction technique. COMPARISON:  Chest CT dated January 15, 2022; PET-CT dated October 01, 2021 FINDINGS: Cardiovascular: Normal heart size. No pericardial effusion. Dilated ascending thoracic aorta, measuring up to 4.4 cm. Mild calcified plaque of the thoracic aorta. Mediastinum/Nodes: Esophagus and thyroid are unremarkable. No No enlarged lymph nodes seen in the chest. Lungs/Pleura: Central airways are patent. Bilateral  pulmonary nodules are stable. Reference solid nodule of the right lower lobe measuring 12 mm on series 4, image 116, is unchanged and with new areas of calcification on today's exam. No consolidation, pleural effusion or pneumothorax. Upper Abdomen: No acute abnormality. Musculoskeletal: No chest wall mass or suspicious bone lesions identified. IMPRESSION: 1. Bilateral pulmonary nodules are stable, nodule of concern in the right lower lobe with new areas of calcification. Findings are consistent with benign granulomas. 2. Dilated ascending thoracic aorta, measuring up to 4.4 cm. Recommend annual imaging followup by CTA or MRA. This recommendation follows 2010 ACCF/AHA/AATS/ACR/ASA/SCA/SCAI/SIR/STS/SVM Guidelines for the  Diagnosis and Management of Patients with Thoracic Aortic Disease. Circulation. 2010; 121: B284-X324. Aortic aneurysm NOS (ICD10-I71.9) 3. Mild aortic Atherosclerosis (ICD10-I70.0). Electronically Signed   By: Michelle Sanchez M.D.   On: 12/14/2022 10:34     Past medical hx Past Medical History:  Diagnosis Date   Allergic rhinitis    Allergy    Arthritis     knee s/p TKR 2/13   Depression    GERD (gastroesophageal reflux disease)    Hypertension    Migraines    OSA (obstructive sleep apnea)    uses cpap nightly   Shingles outbreak 12/2013   L arm/torso   Sleep apnea    cpap   Spinal stenosis      Social History   Tobacco Use   Smoking status: Never   Smokeless tobacco: Never  Substance Use Topics   Alcohol use: Yes    Alcohol/week: 1.0 standard drink of alcohol    Types: 1 Glasses of wine per week    Comment: rare social   Drug use: No    Michelle Sanchez reports that she has never smoked. She has never used smokeless tobacco. She reports current alcohol use of about 1.0 standard drink of alcohol per week. She reports that she does not use drugs.  Tobacco Cessation:  Never smoker    Past surgical hx, Family hx, Social hx all reviewed.  Current Outpatient Medications on File Prior to Visit  Medication Sig   amitriptyline (ELAVIL) 25 MG tablet TAKE 1 TABLET AT BEDTIME   amLODipine (NORVASC) 5 MG tablet Take 1 tablet (5 mg total) by mouth daily.   blood glucose meter kit and supplies KIT Dispense based on patient and insurance preference. Use up to four times daily as directed. (FOR E11.9).   cyclobenzaprine (FLEXERIL) 10 MG tablet Take 1 tablet (10 mg total) by mouth 3 (three) times daily as needed.   ezetimibe (ZETIA) 10 MG tablet TAKE 1 TABLET DAILY   gabapentin (NEURONTIN) 300 MG capsule TAKE 1 CAPSULE IN THE      MORNING, 1 CAPSULE IN THE  AFTERNOON AND 4 CAPSULES ATBEDTIME   glimepiride (AMARYL) 2 MG tablet Take 1 tablet (2 mg total) by mouth daily before breakfast.    glucose blood (ONETOUCH ULTRA) test strip Use as directed to check blood sugar once daily E11.9   JARDIANCE 25 MG TABS tablet TAKE 1 TABLET DAILY BEFORE BREAKFAST (NEW DOSE)   Lancets (ONETOUCH DELICA PLUS LANCET33G) MISC Use as directed to check blood sugar once daily E11.9   meloxicam (MOBIC) 15 MG tablet TAKE 1 TABLET DAILY   omeprazole (PRILOSEC) 20 MG capsule TAKE 1 CAPSULE DAILY   No current facility-administered medications on file prior to visit.     Allergies  Allergen Reactions   Diclofenac Hives   Ozempic (0.25 Or 0.5 Mg-Dose) [Semaglutide(0.25 Or 0.5mg -Dos)]     pancreatitis  Prednisone     Migraines   Diclofenac Sodium Itching   Metformin And Related Nausea Only    GI upset   Septra [Bactrim] Diarrhea   Trimethoprim Diarrhea    Review Of Systems:  Constitutional:   No  weight loss, night sweats,  Fevers, chills, fatigue, or  lassitude.  HEENT:   No headaches,  Difficulty swallowing,  Tooth/dental problems, or  Sore throat,                No sneezing, itching, ear ache, nasal congestion, post nasal drip,   CV:  No chest pain,  Orthopnea, PND, swelling in lower extremities, anasarca, dizziness, palpitations, syncope.   GI  No heartburn, indigestion, abdominal pain, nausea, vomiting, diarrhea, change in bowel habits, loss of appetite, bloody stools.   Resp: No shortness of breath with exertion or at rest.  No excess mucus, no productive cough,  No non-productive cough,  No coughing up of blood.  No change in color of mucus.  No wheezing.  No chest wall deformity  Skin: no rash or lesions.  GU: no dysuria, change in color of urine, no urgency or frequency.  No flank pain, no hematuria   MS:  No joint pain or swelling.  No decreased range of motion.  No back pain.  Psych:  No change in mood or affect. No depression or anxiety.  No memory loss.   Vital Signs BP 138/87 (BP Location: Right Arm, Cuff Size: Large)   Pulse (!) 101   Temp (!) 97.5 F (36.4 C) (Oral)    Ht 5\' 8"  (1.727 m)   Wt 269 lb (122 kg)   SpO2 95% Comment: on RA  BMI 40.90 kg/m    Physical Exam:  General- No distress,  A&Ox3, pleasant ENT: No sinus tenderness, TM clear, pale nasal mucosa, no oral exudate,no post nasal drip, no LAN Cardiac: S1, S2, regular rate and rhythm, no murmur Chest: No wheeze/ rales/ dullness; no accessory muscle use, no nasal flaring, no sternal retractions Abd.: Soft Non-tender, ND, BS +, Body mass index is 40.9 kg/m.  Ext: No clubbing cyanosis, edema Neuro:  normal strength, MAE x 4, A&O x 3, appropriate Skin: No rashes, warm and dry, no lesions  Psych: normal mood and behavior   Assessment/Plan Stable Pulmonary Nodules in a non smoker OSA on CPAP Health care Maintenance vaccines  Plan Your Ct chest shows the nodules we are concerned about are stable.  This is good news. They appear to be similar on imaging to nodules we see in sarcoidosis. As you are having annual imaging by Thoracic Surgery , we will also use their annual scan to monitor your lung nodules.  Please call and make an appointment with Dr. Tonia Sanchez of Zelina Jimerson NP  after you have your Annual Ct chest by Thoracic Surgery.  We can both use the same scan to follow the nodules as well as you aneurysm. This should be 01/2024 based on your current imaging schedule Please make sure you get your eyes checked annually.  I agree with echo to evaluate heart function. It is a good idea to get RSV vaccine. Please get this done at your favorite pharmacy.  Follow up 01/2024 as we discussed. Continue CPAP as you have been doing. Call if you need [place] sooner.  Make sure you get annual eye exams and I agree with echo of your heart per thoracic surgery Please contact office for sooner follow up if symptoms do not improve or worsen or  seek emergency care     I spent 25 minutes dedicated to the care of this patient on the date of this encounter to include pre-visit review of records, face-to-face time  with the patient discussing conditions above, post visit ordering of testing, clinical documentation with the electronic health record, making appropriate referrals as documented, and communicating necessary information to the patient's healthcare team.   Bevelyn Ngo, NP 12/15/2022  11:02 AM

## 2022-12-15 NOTE — Patient Instructions (Signed)
It is good to see you today. Your Ct chest shows the nodules we are concerned about are stable.  This is good news. They appear to be similar on imaging to nodules we see in sarcoidosis. As you are having annual imaging by Thoracic Surgery , we will also use their annual scan to monitor your lung nodules.  Please call and make an appointment with Dr. Tonia Brooms of Deaisha Welborn NP  after you have your Annual Ct chest by Thoracic Surgery.  We can both use the same scan to follow the nodules as well as you aneurysm. This should be 01/2024 based on your current imaging schedule Please make sure you get your eyes checked annually.  I agree with echo to evaluate heart function. It is a good idea to get RSV vaccine. Please get this done at your favorite pharmacy.  Follow up 01/2024 as we discussed. Continue CPAP as you have been doing. Call if you need [place] sooner.  Please contact office for sooner follow up if symptoms do not improve or worsen or seek emergency care

## 2022-12-22 NOTE — Progress Notes (Signed)
CT reviewed at last office visit with SG   Thanks,  BLI  Josephine Igo, DO [facility] 12/22/2022 3:26 PM

## 2023-01-04 ENCOUNTER — Other Ambulatory Visit: Payer: Self-pay | Admitting: Internal Medicine

## 2023-01-21 ENCOUNTER — Ambulatory Visit (HOSPITAL_COMMUNITY): Payer: Medicare Other | Attending: Cardiology

## 2023-01-21 DIAGNOSIS — I712 Thoracic aortic aneurysm, without rupture, unspecified: Secondary | ICD-10-CM | POA: Insufficient documentation

## 2023-01-21 LAB — ECHOCARDIOGRAM COMPLETE
Area-P 1/2: 10.39 cm2
S' Lateral: 3 cm

## 2023-01-21 MED ORDER — PERFLUTREN LIPID MICROSPHERE
1.0000 mL | INTRAVENOUS | Status: AC | PRN
Start: 2023-01-21 — End: 2023-01-21
  Administered 2023-01-21: 1 mL via INTRAVENOUS

## 2023-01-25 ENCOUNTER — Ambulatory Visit (INDEPENDENT_AMBULATORY_CARE_PROVIDER_SITE_OTHER): Payer: Medicare Other | Admitting: Physician Assistant

## 2023-01-25 VITALS — BP 134/75 | HR 100 | Resp 20 | Ht 68.0 in | Wt 263.0 lb

## 2023-01-25 DIAGNOSIS — I712 Thoracic aortic aneurysm, without rupture, unspecified: Secondary | ICD-10-CM | POA: Diagnosis not present

## 2023-01-25 NOTE — Progress Notes (Signed)
HPI: Michelle Sanchez is a 67 year old female with past history notable for a hypertension, type 2 diabetes mellitus, obstructive sleep apnea, history of comfortable flux disease, dyslipidemia, and obesity.  She is followed by the [facility] pulmonology service for bilateral pulmonary nodules that have been stable on serial CT imaging.  She was discovered on a noncontrast CT scan in November of last year to have a 4.4 cm thoracic aortic aneurysm.  She was not immediately referred to [place] so we did not see her regarding the findings on that study until about 2 months ago.  For that reason, she is going for second surveillance visit just 2 months after the first visit, now with an updated CTA and echocardiogram.  She denies any changes in her health since the last visit.  She has not had any chest pain.   Current Outpatient Medications  Medication Sig Dispense Refill   amitriptyline (ELAVIL) 25 MG tablet TAKE 1 TABLET AT BEDTIME 90 tablet 2   amLODipine (NORVASC) 5 MG tablet Take 1 tablet (5 mg total) by mouth daily. 90 tablet 3   blood glucose meter kit and supplies KIT Dispense based on patient and insurance preference. Use up to four times daily as directed. (FOR E11.9). 1 each 0   cyclobenzaprine (FLEXERIL) 10 MG tablet Take 1 tablet (10 mg total) by mouth 3 (three) times daily as needed. 60 tablet 5   ezetimibe (ZETIA) 10 MG tablet TAKE 1 TABLET DAILY 90 tablet 2   gabapentin (NEURONTIN) 300 MG capsule TAKE 1 CAPSULE IN THE      MORNING, 1 CAPSULE IN THE  AFTERNOON AND 4 CAPSULES ATBEDTIME 540 capsule 3   glimepiride (AMARYL) 2 MG tablet Take 1 tablet (2 mg total) by mouth daily before breakfast. 90 tablet 3   glucose blood (ONETOUCH ULTRA) test strip Use as directed to check blood sugar once daily E11.9 100 each 3   JARDIANCE 25 MG TABS tablet TAKE 1 TABLET DAILY BEFORE BREAKFAST (NEW DOSE) 90 tablet 2   Lancets (ONETOUCH DELICA PLUS LANCET33G) MISC Use as directed to check blood sugar once daily E11.9 100  each 0   meloxicam (MOBIC) 15 MG tablet TAKE 1 TABLET DAILY 90 tablet 1   omeprazole (PRILOSEC) 20 MG capsule TAKE 1 CAPSULE DAILY 90 capsule 3   No current facility-administered medications for this visit.    Physical Exam: Vital signs BP 134/75 Pulse 100 Respirations 20 SpO2 98%  Diagnostic Tests: CLINICAL DATA:  Follow-up lung nodule   EXAM: CT CHEST WITHOUT CONTRAST   TECHNIQUE: Multidetector CT imaging of the chest was performed following the standard protocol without IV contrast.   RADIATION DOSE REDUCTION: This exam was performed according to the departmental dose-optimization program which includes automated exposure control, adjustment of the mA and/or kV according to patient size and/or use of iterative reconstruction technique.   COMPARISON:  Chest CT dated January 15, 2022; PET-CT dated October 01, 2021   FINDINGS: Cardiovascular: Normal heart size. No pericardial effusion. Dilated ascending thoracic aorta, measuring up to 4.4 cm. Mild calcified plaque of the thoracic aorta.   Mediastinum/Nodes: Esophagus and thyroid are unremarkable. No No enlarged lymph nodes seen in the chest.   Lungs/Pleura: Central airways are patent. Bilateral pulmonary nodules are stable. Reference solid nodule of the right lower lobe measuring 12 mm on series 4, image 116, is unchanged and with new areas of calcification on today's exam. No consolidation, pleural effusion or pneumothorax.   Upper Abdomen: No acute abnormality.  Musculoskeletal: No chest wall mass or suspicious bone lesions identified.   IMPRESSION: 1. Bilateral pulmonary nodules are stable, nodule of concern in the right lower lobe with new areas of calcification. Findings are consistent with benign granulomas. 2. Dilated ascending thoracic aorta, measuring up to 4.4 cm. Recommend annual imaging followup by CTA or MRA. This recommendation follows 2010 ACCF/AHA/AATS/ACR/ASA/SCA/SCAI/SIR/STS/SVM  Guidelines for the Diagnosis and Management of Patients with Thoracic Aortic Disease. Circulation. 2010; 121: J191-Y782. Aortic aneurysm NOS (ICD10-I71.9) 3. Mild aortic Atherosclerosis (ICD10-I70.0).     Electronically Signed   By: Allegra Lai M.D.   On: 12/14/2022 10:34  ECHOCARDIOGRAM REPORT       Patient Name:   Michelle Sanchez [facility] Date of Exam: 01/21/2023  Medical Rec #:  [id number]       Height:       68.0 in  Accession #:    [id number]      Weight:       269.0 lb  Date of Birth:  01/20/1956       BSA:          2.318 m  Patient Age:    67 years        BP:           164/95 mmHg  Patient Gender: F               HR:           98 bpm.  Exam Location:  [address]   Procedure: 2D Echo, Cardiac Doppler, Color Doppler and Intracardiac             Opacification Agent   Indications:    I71.26 Thoracic Aortic Aneurysm    History:        Patient has no prior history of Echocardiogram  examinations.                 Risk Factors:Hypertension, HLD, Sleep Apnea and Diabetes.    Sonographer:    Clearence Ped RCS  Referring Phys: [id number] STEVEN C HENDRICKSON   IMPRESSIONS     1. Left ventricular ejection fraction, by estimation, is 60 to 65%. The  left ventricle has normal function. The left ventricle has no regional  wall motion abnormalities. There is mild concentric left ventricular  hypertrophy. Left ventricular diastolic  parameters were normal.   2. Right ventricular systolic function is normal. The right ventricular  size is normal. There is normal pulmonary artery systolic pressure.   3. The mitral valve is normal in structure. No evidence of mitral valve  regurgitation. No evidence of mitral stenosis.   4. Tricuspid valve regurgitation is mild to moderate.   5. The aortic valve is normal in structure. Aortic valve regurgitation is  not visualized. No aortic stenosis is present.   6. Aortic dilatation noted. There is dilatation of the ascending aorta,  measuring 43 mm.    7. The inferior vena cava is normal in size with greater than 50%  respiratory variability, suggesting right atrial pressure of 3 mmHg.   FINDINGS   Left Ventricle: Left ventricular ejection fraction, by estimation, is 60  to 65%. The left ventricle has normal function. The left ventricle has no  regional wall motion abnormalities. The left ventricular internal cavity  size was normal in size. There is   mild concentric left ventricular hypertrophy. Left ventricular diastolic  parameters were normal.   Right Ventricle: The right ventricular size is normal. No increase in  right ventricular wall thickness.  Right ventricular systolic function is  normal. There is normal pulmonary artery systolic pressure. The tricuspid  regurgitant velocity is 2.71 m/s, and   with an assumed right atrial pressure of 3 mmHg, the estimated right  ventricular systolic pressure is 32.4 mmHg.   Left Atrium: Left atrial size was normal in size.   Right Atrium: Right atrial size was normal in size.   Pericardium: There is no evidence of pericardial effusion.   Mitral Valve: The mitral valve is normal in structure. No evidence of  mitral valve regurgitation. No evidence of mitral valve stenosis.   Tricuspid Valve: The tricuspid valve is normal in structure. Tricuspid  valve regurgitation is mild to moderate. No evidence of tricuspid  stenosis.   Aortic Valve: The aortic valve is normal in structure. Aortic valve  regurgitation is not visualized. No aortic stenosis is present.   Pulmonic Valve: The pulmonic valve was normal in structure. Pulmonic valve  regurgitation is not visualized. No evidence of pulmonic stenosis.   Aorta: The aortic root is normal in size and structure and aortic  dilatation noted. There is dilatation of the ascending aorta, measuring 43  mm.   Venous: The inferior vena cava is normal in size with greater than 50%  respiratory variability, suggesting right atrial pressure of 3  mmHg.   IAS/Shunts: No atrial level shunt detected by color flow Doppler.     LEFT VENTRICLE  PLAX 2D  LVIDd:         4.00 cm   Diastology  LVIDs:         3.00 cm   LV e' medial:    6.53 cm/s  LV PW:         1.20 cm   LV E/e' medial:  8.9  LV IVS:        1.00 cm   LV e' lateral:   7.94 cm/s  LVOT diam:     2.00 cm   LV E/e' lateral: 7.3  LV SV:         50  LV SV Index:   22  LVOT Area:     3.14 cm     RIGHT VENTRICLE  RV Basal diam:  3.30 cm  RV S prime:     16.50 cm/s  TAPSE (M-mode): 2.2 cm  RVSP:           32.4 mmHg   LEFT ATRIUM             Index        RIGHT ATRIUM           Index  LA diam:        3.10 cm 1.34 cm/m   RA Pressure: 3.00 mmHg  LA Vol (A2C):   45.5 ml 19.63 ml/m  RA Area:     14.00 cm  LA Vol (A4C):   28.5 ml 12.29 ml/m  RA Volume:   34.40 ml  14.84 ml/m  LA Biplane Vol: 36.1 ml 15.57 ml/m   AORTIC VALVE  LVOT Vmax:   94.30 cm/s  LVOT Vmean:  63.900 cm/s  LVOT VTI:    0.159 m    AORTA  Ao Root diam: 3.40 cm  Ao Asc diam:  4.20 cm   MITRAL VALVE               TRICUSPID VALVE  MV Area (PHT):             TR Peak grad:   29.4 mmHg  MV  Decel Time:             TR Vmax:        271.00 cm/s  MV E velocity: 58.20 cm/s  Estimated RAP:  3.00 mmHg  MV A velocity: 93.30 cm/s  RVSP:           32.4 mmHg  MV E/A ratio:  0.62                             SHUNTS                             Systemic VTI:  0.16 m                             Systemic Diam: 2.00 cm   Aditya Sabharwal  Electronically signed by Dorthula Nettles  Signature Date/Time: 01/21/2023/1:27:41 PM        Final       Impression / Plan: Stable 4.4 cm thoracic aortic aneurysm unchanged from CTA 12 months ago.  Echocardiogram shows normal biventricular function and no significant valvular disorders. We again discussed the importance of ongoing surveillance, the importance of careful blood pressure management and monitoring, and the recommendation to avoid repetitive strenuous  activity.  Follow-up in 1 year with CTA chest.   Leary Roca, PA-C [facility] [phone]

## 2023-01-25 NOTE — Patient Instructions (Signed)
-  Continue careful management and monitoring of her blood pressure with a goal of less than 130/80.  -Avoid strenuous activity such as lifting more than 30 pounds.  -Avoid the fluoroquinolone class of antibiotics such as Cipro, Levaquin, moxifloxacin.  Follow-up in 1 year with CTA chest.

## 2023-02-02 NOTE — Progress Notes (Unsigned)
Subjective:    [name], female    DOB: [birth year], 67 y.o.   MRN: [id number]     HPI Michelle Sanchez is here for follow up of her chronic medical problems.  She is doing weight watchers and has lost weight.     Medications and allergies reviewed with patient and updated if appropriate.  Current Outpatient Medications on File Prior to Visit  Medication Sig Dispense Refill   amitriptyline (ELAVIL) 25 MG tablet TAKE 1 TABLET AT BEDTIME 90 tablet 2   amLODipine (NORVASC) 5 MG tablet Take 1 tablet (5 mg total) by mouth daily. 90 tablet 3   cyclobenzaprine (FLEXERIL) 10 MG tablet Take 1 tablet (10 mg total) by mouth 3 (three) times daily as needed. 60 tablet 5   ezetimibe (ZETIA) 10 MG tablet TAKE 1 TABLET DAILY 90 tablet 2   gabapentin (NEURONTIN) 300 MG capsule TAKE 1 CAPSULE IN THE      MORNING, 1 CAPSULE IN THE  AFTERNOON AND 4 CAPSULES ATBEDTIME 540 capsule 3   glimepiride (AMARYL) 2 MG tablet Take 1 tablet (2 mg total) by mouth daily before breakfast. 90 tablet 3   glucose blood (ONETOUCH ULTRA) test strip Use as directed to check blood sugar once daily E11.9 100 each 3   JARDIANCE 25 MG TABS tablet TAKE 1 TABLET DAILY BEFORE BREAKFAST (NEW DOSE) 90 tablet 2   Lancets (ONETOUCH DELICA PLUS LANCET33G) MISC Use as directed to check blood sugar once daily E11.9 100 each 0   meloxicam (MOBIC) 15 MG tablet TAKE 1 TABLET DAILY 90 tablet 1   omeprazole (PRILOSEC) 20 MG capsule TAKE 1 CAPSULE DAILY 90 capsule 3   No current facility-administered medications on file prior to visit.     Review of Systems  Constitutional:  Negative for fever.  Respiratory:  Negative for cough, shortness of breath and wheezing.   Cardiovascular:  Negative for chest pain, palpitations and leg swelling.  Neurological:  Negative for light-headedness and headaches.       Objective:   Vitals:   02/03/23 1510  BP: 120/82  Pulse: 90  Temp: 98.1 F (36.7 C)  SpO2: 96%   BP Readings  from Last 3 Encounters:  02/03/23 120/82  01/25/23 134/75  01/21/23 (!) 164/85   Wt Readings from Last 3 Encounters:  02/03/23 261 lb (118.4 kg)  01/25/23 263 lb (119.3 kg)  12/15/22 269 lb (122 kg)   Body mass index is 39.68 kg/m.    Physical Exam Constitutional:      General: She is not in acute distress.    Appearance: Normal appearance.  HENT:     Head: Normocephalic and atraumatic.  Eyes:     Conjunctiva/sclera: Conjunctivae normal.  Cardiovascular:     Rate and Rhythm: Normal rate and regular rhythm.     Heart sounds: Normal heart sounds.  Pulmonary:     Effort: Pulmonary effort is normal. No respiratory distress.     Breath sounds: Normal breath sounds. No wheezing.  Musculoskeletal:     Cervical back: Neck supple.     Right lower leg: No edema.     Left lower leg: No edema.  Lymphadenopathy:     Cervical: No cervical adenopathy.  Skin:    General: Skin is warm and dry.     Findings: No rash.  Neurological:     Mental Status: She is alert. Mental status is at baseline.  Psychiatric:  Mood and Affect: Mood normal.        Behavior: Behavior normal.        Lab Results  Component Value Date   WBC 8.9 10/28/2021   HGB 14.5 10/28/2021   HCT 43.6 10/28/2021   PLT 264.0 10/28/2021   GLUCOSE 194 (H) 10/28/2022   CHOL 201 (H) 10/28/2022   TRIG 225.0 (H) 10/28/2022   HDL 43.20 10/28/2022   LDLDIRECT 164.0 10/28/2022   LDLCALC 127 (H) 07/29/2021   ALT 22 10/28/2022   AST 18 10/28/2022   NA 136 10/28/2022   K 4.2 10/28/2022   CL 101 10/28/2022   CREATININE 0.71 10/28/2022   BUN 12 10/28/2022   CO2 25 10/28/2022   TSH 1.22 10/28/2022   INR 1.00 04/07/2011   HGBA1C 10.2 (H) 10/28/2022   MICROALBUR 0.7 04/29/2022     Assessment & Plan:    See Problem List for Assessment and Plan of chronic medical problems.

## 2023-02-02 NOTE — Patient Instructions (Addendum)
      Blood work was ordered.       Medications changes include :   None    A referral was ordered and someone will call you to schedule an appointment.     Return in about 6 months (around 08/04/2023) for follow up.

## 2023-02-03 ENCOUNTER — Ambulatory Visit (INDEPENDENT_AMBULATORY_CARE_PROVIDER_SITE_OTHER): Payer: Medicare Other | Admitting: Internal Medicine

## 2023-02-03 VITALS — BP 120/82 | HR 90 | Temp 98.1°F | Ht 68.0 in | Wt 261.0 lb

## 2023-02-03 DIAGNOSIS — Z794 Long term (current) use of insulin: Secondary | ICD-10-CM

## 2023-02-03 DIAGNOSIS — M48061 Spinal stenosis, lumbar region without neurogenic claudication: Secondary | ICD-10-CM

## 2023-02-03 DIAGNOSIS — E782 Mixed hyperlipidemia: Secondary | ICD-10-CM | POA: Diagnosis not present

## 2023-02-03 DIAGNOSIS — E1165 Type 2 diabetes mellitus with hyperglycemia: Secondary | ICD-10-CM

## 2023-02-03 DIAGNOSIS — G43009 Migraine without aura, not intractable, without status migrainosus: Secondary | ICD-10-CM

## 2023-02-03 DIAGNOSIS — M810 Age-related osteoporosis without current pathological fracture: Secondary | ICD-10-CM | POA: Diagnosis not present

## 2023-02-03 DIAGNOSIS — K219 Gastro-esophageal reflux disease without esophagitis: Secondary | ICD-10-CM

## 2023-02-03 DIAGNOSIS — I10 Essential (primary) hypertension: Secondary | ICD-10-CM | POA: Diagnosis not present

## 2023-02-03 DIAGNOSIS — G629 Polyneuropathy, unspecified: Secondary | ICD-10-CM

## 2023-02-03 DIAGNOSIS — Z6841 Body Mass Index (BMI) 40.0 and over, adult: Secondary | ICD-10-CM

## 2023-02-03 LAB — MICROALBUMIN / CREATININE URINE RATIO
Creatinine,U: 60.7 mg/dL
Microalb Creat Ratio: 1.5 mg/g (ref 0.0–30.0)
Microalb, Ur: 0.9 mg/dL (ref 0.0–1.9)

## 2023-02-03 LAB — HEMOGLOBIN A1C: Hgb A1c MFr Bld: 8.1 % — ABNORMAL HIGH (ref 4.6–6.5)

## 2023-02-03 LAB — COMPREHENSIVE METABOLIC PANEL
ALT: 17 U/L (ref 0–35)
AST: 17 U/L (ref 0–37)
Albumin: 4.7 g/dL (ref 3.5–5.2)
Alkaline Phosphatase: 94 U/L (ref 39–117)
BUN: 21 mg/dL (ref 6–23)
CO2: 28 meq/L (ref 19–32)
Calcium: 10.1 mg/dL (ref 8.4–10.5)
Chloride: 100 meq/L (ref 96–112)
Creatinine, Ser: 0.8 mg/dL (ref 0.40–1.20)
GFR: 76.36 mL/min (ref >60.00)
Glucose, Bld: 119 mg/dL — ABNORMAL HIGH (ref 70–99)
Potassium: 4.2 meq/L (ref 3.5–5.1)
Sodium: 137 meq/L (ref 135–145)
Total Bilirubin: 0.5 mg/dL (ref 0.2–1.2)
Total Protein: 8.5 g/dL — ABNORMAL HIGH (ref 6.0–8.3)

## 2023-02-03 LAB — LIPID PANEL
Cholesterol: 186 mg/dL (ref 0–200)
HDL: 43.1 mg/dL (ref >39.00)
LDL Cholesterol: 97 mg/dL (ref 0–99)
NonHDL: 142.67
Total CHOL/HDL Ratio: 4
Triglycerides: 228 mg/dL — ABNORMAL HIGH (ref 0.0–149.0)
VLDL: 45.6 mg/dL — ABNORMAL HIGH (ref 0.0–40.0)

## 2023-02-03 NOTE — Assessment & Plan Note (Signed)
Chronic Continue meloxicam 15 mg daily-discussed possible side effects Continue gabapentin 300 mg in the morning, 300 mg in the afternoon and 1200 mg at bedtime

## 2023-02-03 NOTE — Assessment & Plan Note (Signed)
Chronic GERD controlled Continue omeprazole 20 mg daily  

## 2023-02-03 NOTE — Assessment & Plan Note (Signed)
Chronic Related to back pain, but diabetes may start contributing Continue gabapentin 300 mg in the morning and afternoon and 1200 mg at bedtime Continue amitriptyline 25 mg at bedtime

## 2023-02-03 NOTE — Assessment & Plan Note (Addendum)
Chronic  Lab Results  Component Value Date   HGBA1C 10.2 (H) 10/28/2022    Sugars not ideally controlled  Testing sugars 1 times a day Check A1c, urine microalbumin Continue Jardiance 25 mg daily, glimepiride 2 mg daily Has been exercising regularly and has been doing weight watchers.  Has lost weight Continue above

## 2023-02-03 NOTE — Assessment & Plan Note (Addendum)
Chronic Monitored by gynecologist-will get report

## 2023-02-03 NOTE — Assessment & Plan Note (Signed)
Chronic Regular exercise and healthy diet encouraged Check lipid panel  Continue Zetia 10 mg daily 

## 2023-02-03 NOTE — Assessment & Plan Note (Addendum)
Chronic Actively working on weight loss and has lost some weight in the last couple of months stressed regular exercise, which she is doing Stressed diabetic diet-currently doing [employer]

## 2023-02-03 NOTE — Assessment & Plan Note (Signed)
Chronic Occasional migraines Continue amitriptyline 25 mg at bedtime

## 2023-02-03 NOTE — Assessment & Plan Note (Signed)
Chronic °Blood pressure well controlled °CMP °Continue amlodipine 5 mg daily °

## 2023-02-04 LAB — CBC WITH DIFFERENTIAL/PLATELET
Basophils Absolute: 0 10*3/uL (ref 0.0–0.1)
Basophils Relative: 0.5 % (ref 0.0–3.0)
Eosinophils Absolute: 0.3 10*3/uL (ref 0.0–0.7)
Eosinophils Relative: 3 % (ref 0.0–5.0)
HCT: 48.6 % — ABNORMAL HIGH (ref 36.0–46.0)
Hemoglobin: 15.6 g/dL — ABNORMAL HIGH (ref 12.0–15.0)
Lymphocytes Relative: 22.1 % (ref 12.0–46.0)
Lymphs Abs: 2 10*3/uL (ref 0.7–4.0)
MCHC: 32.2 g/dL (ref 30.0–36.0)
MCV: 88.1 fL (ref 78.0–100.0)
Monocytes Absolute: 0.6 10*3/uL (ref 0.1–1.0)
Monocytes Relative: 7.2 % (ref 3.0–12.0)
Neutro Abs: 6 10*3/uL (ref 1.4–7.7)
Neutrophils Relative %: 67.2 % (ref 43.0–77.0)
Platelets: 312 10*3/uL (ref 150.0–400.0)
RBC: 5.52 Mil/uL — ABNORMAL HIGH (ref 3.87–5.11)
RDW: 14.1 % (ref 11.5–15.5)
WBC: 9 10*3/uL (ref 4.0–10.5)

## 2023-02-05 ENCOUNTER — Encounter: Payer: Self-pay | Admitting: Internal Medicine

## 2023-02-09 MED ORDER — GLIMEPIRIDE 4 MG PO TABS: 4.0000 mg | ORAL_TABLET | Freq: Every day | ORAL | 3 refills | Status: DC

## 2023-02-22 ENCOUNTER — Encounter: Payer: Self-pay | Admitting: Internal Medicine

## 2023-02-22 NOTE — Telephone Encounter (Signed)
 Care team updated and letter sent for eye exam notes.

## 2023-02-26 ENCOUNTER — Other Ambulatory Visit: Payer: Self-pay

## 2023-02-26 ENCOUNTER — Telehealth: Payer: Self-pay

## 2023-02-26 NOTE — Telephone Encounter (Signed)
Copied from CRM [id number]. Topic: Clinical - Medication Question >> Feb 26, 2023  1:26 PM Lennart Pall wrote:  Reason for CRM: Pharmacy calling to see which medication was the correct one they needed to fill. glimepiride (AMARYL) 2mg  and 4mg  was listed. Pharmacy was confused as to which one needed to be filled.  [phone] option 2- [id number] reference #

## 2023-02-26 NOTE — Telephone Encounter (Signed)
Called pharmacy and per Dr. Lawerance Bach, lab results advised them pt needs to be on 4 mg

## 2023-03-28 ENCOUNTER — Other Ambulatory Visit: Payer: Self-pay | Admitting: Internal Medicine

## 2023-04-16 ENCOUNTER — Encounter: Payer: Self-pay | Admitting: Internal Medicine

## 2023-04-17 ENCOUNTER — Other Ambulatory Visit: Payer: Self-pay | Admitting: Internal Medicine

## 2023-04-28 ENCOUNTER — Ambulatory Visit (INDEPENDENT_AMBULATORY_CARE_PROVIDER_SITE_OTHER): Payer: Medicare Other | Admitting: Internal Medicine

## 2023-04-28 VITALS — BP 132/84 | HR 74 | Wt 265.2 lb

## 2023-04-28 DIAGNOSIS — I10 Essential (primary) hypertension: Secondary | ICD-10-CM | POA: Diagnosis not present

## 2023-04-28 DIAGNOSIS — B070 Plantar wart: Secondary | ICD-10-CM | POA: Diagnosis not present

## 2023-04-28 DIAGNOSIS — L989 Disorder of the skin and subcutaneous tissue, unspecified: Secondary | ICD-10-CM | POA: Diagnosis not present

## 2023-04-28 NOTE — Progress Notes (Signed)
 Subjective:    [name], female    DOB: [birth year], 68 y.o.   MRN: [id number]      HPI Bettie is here for  Chief Complaint  Patient presents with   Breast Problem    Lump on left breast. Will schedule mammogram and eye exam.     Left lateral breast lump/ present since may 2024 -does not hurt and it does not reach.  She is not sure but she does not think it is changed in size.  She did have it when she saw her gynecologist and he gave her cream to use for 3 days and thought that would take care of it, but it did not.  She had her mammogram a couple of weeks ago after the lesion appeared and it was normal.  She is not 100% sure there was not any to the area but does not recall anything specific.  She has her mammogram coming up again and wanted to have it evaluated before she went in for the mammogram.   Medications and allergies reviewed with patient and updated if appropriate.  Current Outpatient Medications on File Prior to Visit  Medication Sig Dispense Refill   amitriptyline (ELAVIL) 25 MG tablet TAKE 1 TABLET AT BEDTIME 90 tablet 2   amLODipine (NORVASC) 5 MG tablet Take 1 tablet (5 mg total) by mouth daily. 90 tablet 3   cyclobenzaprine (FLEXERIL) 10 MG tablet Take 1 tablet (10 mg total) by mouth 3 (three) times daily as needed. 60 tablet 5   ezetimibe (ZETIA) 10 MG tablet TAKE 1 TABLET DAILY 90 tablet 2   gabapentin (NEURONTIN) 300 MG capsule TAKE 1 CAPSULE IN THE      MORNING, 1 CAPSULE IN THE  AFTERNOON AND 4 CAPSULES ATBEDTIME 540 capsule 3   glimepiride (AMARYL) 4 MG tablet Take 1 tablet (4 mg total) by mouth daily before breakfast. 90 tablet 3   glucose blood (ONETOUCH ULTRA) test strip Use as directed to check blood sugar once daily E11.9 100 each 3   JARDIANCE 25 MG TABS tablet TAKE 1 TABLET DAILY BEFORE BREAKFAST (NEW DOSE) 90 tablet 2   Lancets (ONETOUCH DELICA PLUS LANCET33G) MISC Use as directed to check blood sugar once daily E11.9 100 each 0    meloxicam (MOBIC) 15 MG tablet TAKE 1 TABLET DAILY 90 tablet 1   omeprazole (PRILOSEC) 20 MG capsule TAKE 1 CAPSULE DAILY 90 capsule 3   No current facility-administered medications on file prior to visit.    Review of Systems     Objective:   Vitals:   04/28/23 1403  BP: 132/84  Pulse: 74  SpO2: 94%   BP Readings from Last 3 Encounters:  04/28/23 132/84  02/03/23 120/82  01/25/23 134/75   Wt Readings from Last 3 Encounters:  04/28/23 265 lb 3.2 oz (120.3 kg)  02/03/23 261 lb (118.4 kg)  01/25/23 263 lb (119.3 kg)   Body mass index is 40.32 kg/m.    Physical Exam Constitutional:      General: She is not in acute distress.    Appearance: Normal appearance. She is not ill-appearing.  HENT:     Head: Normocephalic and atraumatic.  Chest:  Breasts:    Left: Skin change (Left lateral breast there is a nickel sized slightly raised circular area of erythema-nontender, associated with slight thickening of the skin.  This seems to be more official in skin related than a true lump in the breast tissue) present.  No swelling, inverted nipple, mass or nipple discharge.  Skin:    General: Skin is warm and dry.  Neurological:     Mental Status: She is alert.            Assessment & Plan:    See Problem List for Assessment and Plan of chronic medical problems.

## 2023-04-28 NOTE — Assessment & Plan Note (Signed)
 Chronic Blood pressure controlled Continue amlodipine 5 mg daily

## 2023-04-28 NOTE — Assessment & Plan Note (Signed)
 Referral to dermatology.

## 2023-04-28 NOTE — Patient Instructions (Signed)
       A referral was ordered for [facility] dermatology and someone will call you to schedule an appointment.

## 2023-04-28 NOTE — Assessment & Plan Note (Signed)
 On her lateral left breast that has been present for months and has not changed It does not itch and it does not hurt Had a mammogram 2 weeks after she noticed this was there and it was normal Mammogram is coming out and she wanted this evaluated if this lesion is skin related and not breast-full for dermatology ordered

## 2023-04-30 ENCOUNTER — Ambulatory Visit: Payer: Medicare Other | Admitting: Internal Medicine

## 2023-05-17 ENCOUNTER — Encounter: Payer: Self-pay | Admitting: Internal Medicine

## 2023-05-17 ENCOUNTER — Other Ambulatory Visit: Payer: Self-pay

## 2023-05-17 MED ORDER — GLIMEPIRIDE 4 MG PO TABS: 4.0000 mg | ORAL_TABLET | Freq: Every day | ORAL | 3 refills | Status: AC

## 2023-06-19 ENCOUNTER — Other Ambulatory Visit: Payer: Self-pay | Admitting: Internal Medicine

## 2023-07-18 ENCOUNTER — Other Ambulatory Visit: Payer: Self-pay | Admitting: Internal Medicine

## 2023-08-03 ENCOUNTER — Encounter: Payer: Self-pay | Admitting: Internal Medicine

## 2023-08-03 NOTE — Patient Instructions (Addendum)
      Blood work was ordered.       Medications changes include :   None    A referral was ordered and someone will call you to schedule an appointment.     Return in about 6 months (around 02/03/2024) for follow up.

## 2023-08-03 NOTE — Progress Notes (Unsigned)
 Subjective:    [name], female    DOB: [birth year], 68 y.o.   MRN: [id number]     HPI Michelle Sanchez is here for follow up of her chronic medical problems.  Walking 1-2 times a week.  Has been compliant with a low sugar diet.  She has been eating better.  Medications and allergies reviewed with patient and updated if appropriate.  Current Outpatient Medications on File Prior to Visit  Medication Sig Dispense Refill   amitriptyline  (ELAVIL ) 25 MG tablet TAKE 1 TABLET AT BEDTIME 90 tablet 2   amLODipine  (NORVASC ) 5 MG tablet TAKE 1 TABLET DAILY 90 tablet 3   cyclobenzaprine  (FLEXERIL ) 10 MG tablet Take 1 tablet (10 mg total) by mouth 3 (three) times daily as needed. 60 tablet 5   ezetimibe  (ZETIA ) 10 MG tablet TAKE 1 TABLET DAILY 90 tablet 2   gabapentin  (NEURONTIN ) 300 MG capsule TAKE 1 CAPSULE IN THE      MORNING, 1 CAPSULE IN THE  AFTERNOON AND 4 CAPSULES ATBEDTIME 540 capsule 3   glimepiride  (AMARYL ) 4 MG tablet Take 1 tablet (4 mg total) by mouth daily before breakfast. 90 tablet 3   glucose blood (ONETOUCH ULTRA) test strip Use as directed to check blood sugar once daily E11.9 100 each 3   JARDIANCE  25 MG TABS tablet TAKE 1 TABLET DAILY BEFORE BREAKFAST (NEW DOSE) 90 tablet 2   Lancets (ONETOUCH DELICA PLUS LANCET33G) MISC Use as directed to check blood sugar once daily E11.9 100 each 0   meloxicam  (MOBIC ) 15 MG tablet TAKE 1 TABLET DAILY 90 tablet 1   omeprazole  (PRILOSEC) 20 MG capsule TAKE 1 CAPSULE DAILY 90 capsule 3   No current facility-administered medications on file prior to visit.     Review of Systems  Constitutional:  Negative for fever.  Respiratory:  Negative for cough, shortness of breath and wheezing.   Cardiovascular:  Negative for chest pain, palpitations and leg swelling.  Neurological:  Positive for headaches (occ). Negative for light-headedness.       Objective:   Vitals:   08/04/23 1123  BP: 118/80  Pulse: 64  Temp: (!) 97.4  F (36.3 C)  SpO2: 95%   BP Readings from Last 3 Encounters:  08/04/23 118/80  04/28/23 132/84  02/03/23 120/82   Wt Readings from Last 3 Encounters:  08/04/23 260 lb (117.9 kg)  04/28/23 265 lb 3.2 oz (120.3 kg)  02/03/23 261 lb (118.4 kg)   Body mass index is 39.53 kg/m.    Physical Exam Constitutional:      General: She is not in acute distress.    Appearance: Normal appearance.  HENT:     Head: Normocephalic and atraumatic.  Eyes:     Conjunctiva/sclera: Conjunctivae normal.  Cardiovascular:     Rate and Rhythm: Normal rate and regular rhythm.     Heart sounds: Normal heart sounds.  Pulmonary:     Effort: Pulmonary effort is normal. No respiratory distress.     Breath sounds: Normal breath sounds. No wheezing.  Musculoskeletal:     Cervical back: Neck supple.     Right lower leg: No edema.     Left lower leg: No edema.  Lymphadenopathy:     Cervical: No cervical adenopathy.  Skin:    General: Skin is warm and dry.     Findings: No rash.  Neurological:     Mental Status: She is alert. Mental status is at baseline.  Psychiatric:  Mood and Affect: Mood normal.        Behavior: Behavior normal.        Lab Results  Component Value Date   WBC 9.0 02/03/2023   HGB 15.6 (H) 02/03/2023   HCT 48.6 (H) 02/03/2023   PLT 312.0 02/03/2023   GLUCOSE 119 (H) 02/03/2023   CHOL 186 02/03/2023   TRIG 228.0 (H) 02/03/2023   HDL 43.10 02/03/2023   LDLDIRECT 164.0 10/28/2022   LDLCALC 97 02/03/2023   ALT 17 02/03/2023   AST 17 02/03/2023   NA 137 02/03/2023   K 4.2 02/03/2023   CL 100 02/03/2023   CREATININE 0.80 02/03/2023   BUN 21 02/03/2023   CO2 28 02/03/2023   TSH 1.22 10/28/2022   INR 1.00 04/07/2011   HGBA1C 8.1 (H) 02/03/2023   MICROALBUR 0.9 02/03/2023     Assessment & Plan:    See Problem List for Assessment and Plan of chronic medical problems.

## 2023-08-04 ENCOUNTER — Ambulatory Visit (INDEPENDENT_AMBULATORY_CARE_PROVIDER_SITE_OTHER): Payer: Medicare Other | Admitting: Internal Medicine

## 2023-08-04 VITALS — BP 118/80 | HR 64 | Temp 97.4°F | Ht 68.0 in | Wt 260.0 lb

## 2023-08-04 DIAGNOSIS — E782 Mixed hyperlipidemia: Secondary | ICD-10-CM | POA: Diagnosis not present

## 2023-08-04 DIAGNOSIS — M48061 Spinal stenosis, lumbar region without neurogenic claudication: Secondary | ICD-10-CM

## 2023-08-04 DIAGNOSIS — Z6841 Body Mass Index (BMI) 40.0 and over, adult: Secondary | ICD-10-CM

## 2023-08-04 DIAGNOSIS — I10 Essential (primary) hypertension: Secondary | ICD-10-CM

## 2023-08-04 DIAGNOSIS — K219 Gastro-esophageal reflux disease without esophagitis: Secondary | ICD-10-CM

## 2023-08-04 DIAGNOSIS — G629 Polyneuropathy, unspecified: Secondary | ICD-10-CM

## 2023-08-04 DIAGNOSIS — E1165 Type 2 diabetes mellitus with hyperglycemia: Secondary | ICD-10-CM | POA: Diagnosis not present

## 2023-08-04 DIAGNOSIS — Z794 Long term (current) use of insulin: Secondary | ICD-10-CM

## 2023-08-04 LAB — LIPID PANEL
Cholesterol: 149 mg/dL (ref 0–200)
HDL: 44.1 mg/dL (ref >39.00)
LDL Cholesterol: 81 mg/dL (ref 0–99)
NonHDL: 104.77
Total CHOL/HDL Ratio: 3
Triglycerides: 117 mg/dL (ref 0.0–149.0)
VLDL: 23.4 mg/dL (ref 0.0–40.0)

## 2023-08-04 LAB — COMPREHENSIVE METABOLIC PANEL WITH GFR
ALT: 18 U/L (ref 0–35)
AST: 16 U/L (ref 0–37)
Albumin: 4.6 g/dL (ref 3.5–5.2)
Alkaline Phosphatase: 97 U/L (ref 39–117)
BUN: 17 mg/dL (ref 6–23)
CO2: 29 meq/L (ref 19–32)
Calcium: 10 mg/dL (ref 8.4–10.5)
Chloride: 102 meq/L (ref 96–112)
Creatinine, Ser: 0.59 mg/dL (ref 0.40–1.20)
GFR: 93.08 mL/min (ref >60.00)
Glucose, Bld: 125 mg/dL — ABNORMAL HIGH (ref 70–99)
Potassium: 3.9 meq/L (ref 3.5–5.1)
Sodium: 138 meq/L (ref 135–145)
Total Bilirubin: 0.5 mg/dL (ref 0.2–1.2)
Total Protein: 8.1 g/dL (ref 6.0–8.3)

## 2023-08-04 LAB — HEMOGLOBIN A1C: Hgb A1c MFr Bld: 8.3 % — ABNORMAL HIGH (ref 4.6–6.5)

## 2023-08-04 MED ORDER — CYCLOBENZAPRINE HCL 10 MG PO TABS: 10.0000 mg | ORAL_TABLET | Freq: Every day | ORAL | 3 refills | Status: AC

## 2023-08-04 MED ORDER — AMITRIPTYLINE HCL 25 MG PO TABS: 25.0000 mg | ORAL_TABLET | Freq: Every day | ORAL | 2 refills | Status: AC

## 2023-08-04 MED ORDER — ONDANSETRON 4 MG PO TBDP: 4.0000 mg | ORAL_TABLET | Freq: Three times a day (TID) | ORAL | 5 refills | Status: AC | PRN

## 2023-08-04 NOTE — Assessment & Plan Note (Signed)
 Chronic Actively working on weight loss  stressed regular exercise, which she is doing Stressed diabetic diet-currently doing [employer]

## 2023-08-04 NOTE — Assessment & Plan Note (Signed)
 Chronic Blood pressure controlled cmp Continue amlodipine  5 mg daily

## 2023-08-04 NOTE — Assessment & Plan Note (Signed)
 Chronic Lab Results  Component Value Date   LDLCALC 97 02/03/2023   Regular exercise and healthy diet encouraged Check lipid panel  Continue Zetia  10 mg daily

## 2023-08-04 NOTE — Assessment & Plan Note (Addendum)
 Chronic Continue meloxicam  15 mg daily-discussed possible side effects Continue gabapentin  300 mg in the morning, 300 mg in the afternoon and 1200 mg at bedtime Continue flexeril  10 mg HS

## 2023-08-04 NOTE — Assessment & Plan Note (Addendum)
 Chronic  Lab Results  Component Value Date   HGBA1C 8.1 (H) 02/03/2023    Sugars not ideally controlled  She has been taking supplement-berberine Check A1c Continue Jardiance  25 mg daily, glimepiride  4 mg daily Has been exercising regularly-work on increasing exercise Continue weight loss efforts continue above

## 2023-08-04 NOTE — Assessment & Plan Note (Addendum)
 Chronic Related to back pain, but diabetes may start contributing Continue gabapentin  300 mg in the morning and afternoon and 1200 mg at bedtime Continue amitriptyline  25 mg at bedtime Continue flexeril  10 mg HS

## 2023-08-04 NOTE — Assessment & Plan Note (Signed)
Chronic GERD controlled Continue omeprazole 20 mg daily  

## 2023-08-05 ENCOUNTER — Ambulatory Visit: Payer: Self-pay | Admitting: Internal Medicine

## 2023-08-12 ENCOUNTER — Other Ambulatory Visit (HOSPITAL_COMMUNITY): Payer: Self-pay

## 2023-09-06 ENCOUNTER — Ambulatory Visit: Admitting: Physician Assistant

## 2023-11-03 ENCOUNTER — Ambulatory Visit (INDEPENDENT_AMBULATORY_CARE_PROVIDER_SITE_OTHER)

## 2023-11-03 VITALS — Ht 68.0 in | Wt 260.0 lb

## 2023-11-03 DIAGNOSIS — Z0000 Encounter for general adult medical examination without abnormal findings: Secondary | ICD-10-CM

## 2023-11-03 DIAGNOSIS — Z794 Long term (current) use of insulin: Secondary | ICD-10-CM | POA: Diagnosis not present

## 2023-11-03 DIAGNOSIS — E1165 Type 2 diabetes mellitus with hyperglycemia: Secondary | ICD-10-CM | POA: Diagnosis not present

## 2023-11-03 NOTE — Progress Notes (Signed)
 Subjective:   Michelle Sanchez is a 67 y.o. who presents for a Medicare Wellness preventive visit.  As a reminder, Annual Wellness Visits don't include a physical exam, and some assessments may be limited, especially if this visit is performed virtually. We may recommend an in-person follow-up visit with your provider if needed.  Visit Complete: Virtual I connected with  Michelle Sanchez on 11/03/23 by a audio enabled telemedicine application and verified that I am speaking with the correct person using two identifiers.  Patient Location: Home  Provider Location: Home Office  I discussed the limitations of evaluation and management by telemedicine. The patient expressed understanding and agreed to proceed.  Vital Signs: Because this visit was a virtual/telehealth visit, some criteria may be missing or patient reported. Any vitals not documented were not able to be obtained and vitals that have been documented are patient reported.  VideoDeclined- This patient declined Librarian, academic. Therefore the visit was completed with audio only.  Persons Participating in Visit: Patient.  AWV Questionnaire: Yes: Patient Medicare AWV questionnaire was completed by the patient on 11/02/2023; I have confirmed that all information answered by patient is correct and no changes since this date.  Cardiac Risk Factors include: advanced age (>62men, >53 women);diabetes mellitus;hypertension;Other (see comment);dyslipidemia, Risk factor comments: OSA     Objective:    Today's Vitals   11/03/23 1300  Weight: 260 lb (117.9 kg)  Height: 5' 8 (1.727 m)   Body mass index is 39.53 kg/m.     11/03/2023    1:11 PM 10/29/2022    3:01 PM 07/30/2021    7:35 PM 08/08/2014   12:54 PM 04/13/2011   11:00 AM 04/07/2011    2:50 PM  Advanced Directives  Does Patient Have a Medical Advance Directive? No No No No  Patient would not like information;Patient does not have advance directive   Patient does not have advance directive   Would patient like information on creating a medical advance directive?  No - Patient declined No - Patient declined     Pre-existing out of facility DNR order (yellow form or pink MOST form)     No  No      Data saved with a previous flowsheet row definition    Current Medications (verified) Outpatient Encounter Medications as of 11/03/2023  Medication Sig   amitriptyline  (ELAVIL ) 25 MG tablet Take 1 tablet (25 mg total) by mouth at bedtime.   amLODipine  (NORVASC ) 5 MG tablet TAKE 1 TABLET DAILY   cyclobenzaprine  (FLEXERIL ) 10 MG tablet Take 1 tablet (10 mg total) by mouth at bedtime.   ezetimibe  (ZETIA ) 10 MG tablet TAKE 1 TABLET DAILY   gabapentin  (NEURONTIN ) 300 MG capsule TAKE 1 CAPSULE IN THE      MORNING, 1 CAPSULE IN THE  AFTERNOON AND 4 CAPSULES ATBEDTIME   glimepiride  (AMARYL ) 4 MG tablet Take 1 tablet (4 mg total) by mouth daily before breakfast.   glucose blood (ONETOUCH ULTRA) test strip Use as directed to check blood sugar once daily E11.9   JARDIANCE  25 MG TABS tablet TAKE 1 TABLET DAILY BEFORE BREAKFAST (NEW DOSE)   Lancets (ONETOUCH DELICA PLUS LANCET33G) MISC Use as directed to check blood sugar once daily E11.9   meloxicam  (MOBIC ) 15 MG tablet TAKE 1 TABLET DAILY   omeprazole  (PRILOSEC) 20 MG capsule TAKE 1 CAPSULE DAILY   ondansetron  (ZOFRAN -ODT) 4 MG disintegrating tablet Take 1 tablet (4 mg total) by mouth every 8 (eight) hours as  needed for nausea or vomiting.   No facility-administered encounter medications on file as of 11/03/2023.    Allergies (verified) Diclofenac, Ozempic  (0.25 or 0.5 mg-dose) [semaglutide (0.25 or 0.5mg -dos)], Prednisone, Diclofenac sodium, Metformin  and related, Septra [bactrim], and Trimethoprim   History: Past Medical History:  Diagnosis Date   Allergic rhinitis    Allergy    Arthritis     knee s/p TKR 2/13   Depression    GERD (gastroesophageal reflux disease)    Hypertension    Migraines     OSA (obstructive sleep apnea)    uses cpap nightly   Shingles outbreak 12/2013   L arm/torso   Sleep apnea    cpap   Spinal stenosis    Past Surgical History:  Procedure Laterality Date   Knee replacement Left 2012   KNEE SURGERY     both knees   TOTAL KNEE ARTHROPLASTY  04/13/2011   Procedure: TOTAL KNEE ARTHROPLASTY;  Surgeon: Dempsey LULLA Moan, MD;  Location: [facility];  Service: Orthopedics;  Laterality: Left;   Family History  Problem Relation Age of Onset   Arthritis Mother    Breast cancer Mother    Emphysema Mother    Asthma Mother    Heart disease Mother    Arthritis Father    Cancer Father    Alcohol abuse Other    Diabetes Other    Colon cancer Neg Hx    Social History   Socioeconomic History   Marital status: Significant Other    Spouse name: Not on file   Number of children: Not on file   Years of education: Not on file   Highest education level: 12th grade  Occupational History   Occupation: retired  Tobacco Use   Smoking status: Never   Smokeless tobacco: Never  Vaping Use   Vaping status: Never Used  Substance and Sexual Activity   Alcohol use: Yes    Alcohol/week: 1.0 standard drink of alcohol    Types: 1 Glasses of wine per week    Comment: rare social   Drug use: No   Sexual activity: Yes  Other Topics Concern   Not on file  Social History Narrative   Lives with significant other/2025 and 1 cat   Social Drivers of Corporate investment banker Strain: Low Risk  (11/02/2023)   Overall Financial Resource Strain (CARDIA)    Difficulty of Paying Living Expenses: Not hard at all  Food Insecurity: No Food Insecurity (11/02/2023)   Hunger Vital Sign    Worried About Running Out of Food in the Last Year: Never true    Ran Out of Food in the Last Year: Never true  Transportation Needs: No Transportation Needs (11/02/2023)   PRAPARE - Administrator, Civil Service (Medical): No    Lack of Transportation (Non-Medical): No  Physical Activity:  Insufficiently Active (11/02/2023)   Exercise Vital Sign    Days of Exercise per Week: 2 days    Minutes of Exercise per Session: 30 min  Stress: No Stress Concern Present (11/02/2023)   [employer] of Occupational Health - Occupational Stress Questionnaire    Feeling of Stress: Not at all  Social Connections: Moderately Isolated (11/02/2023)   Social Connection and Isolation Panel    Frequency of Communication with Friends and Family: Once a week    Frequency of Social Gatherings with Friends and Family: Once a week    Attends Religious Services: 1 to 4 times per year    Active  Member of Clubs or Organizations: No    Attends Engineer, structural: Not on file    Marital Status: Living with partner    Tobacco Counseling Counseling given: Not Answered    Clinical Intake:  Pre-visit preparation completed: Yes  Pain : No/denies pain     BMI - recorded: 39.53 Nutritional Status: BMI > 30  Obese Nutritional Risks: None Diabetes: Yes CBG done?: No Did pt. bring in CBG monitor from home?: No  Lab Results  Component Value Date   HGBA1C 8.3 (H) 08/04/2023   HGBA1C 8.1 (H) 02/03/2023   HGBA1C 10.2 (H) 10/28/2022     How often do you need to have someone help you when you read instructions, pamphlets, or other written materials from your doctor or pharmacy?: 1 - Never  Interpreter Needed?: No  Information entered by :: Marthena Whitmyer, RMA   Activities of Daily Living     11/02/2023    4:45 PM  In your present state of health, do you have any difficulty performing the following activities:  Hearing? 0  Vision? 0  Difficulty concentrating or making decisions? 0  Walking or climbing stairs? 0  Dressing or bathing? 0  Doing errands, shopping? 0  Preparing Food and eating ? N  Using the Toilet? N  In the past six months, have you accidently leaked urine? N  Do you have problems with loss of bowel control? N  Managing your Medications? N  Managing your Finances?  N  Housekeeping or managing your Housekeeping? N    Patient Care Team: Geofm Glade PARAS, MD as PCP - General (Internal Medicine) Melodi Lerner, MD (Orthopedic Surgery) Curlene Agent, MD (Obstetrics and Gynecology) Corrie, Francis HERO, MD (Sleep Medicine) [facility], [place]  I have updated your Care Teams any recent Medical Services you may have received from other providers in the past year.     Assessment:   This is a routine wellness examination for Michelle Sanchez.  Hearing/Vision screen Hearing Screening - Comments:: Denies hearing difficulties   Vision Screening - Comments:: Wear eyeglasses/ [facility]    Goals Addressed               This Visit's Progress     Patient Stated (pt-stated)   Not on track     Patient wants to lose at least 100 lbs, get her A1c down and lipids down, Overall health  Patient stated that she is still working on it/2025       Depression Screen     11/03/2023    1:13 PM 02/03/2023    3:23 PM 10/29/2022    3:04 PM 10/29/2022    2:59 PM 10/28/2022    1:05 PM 04/29/2022    2:26 PM 10/28/2021    2:43 PM  PHQ 2/9 Scores  PHQ - 2 Score 0 0 0 0 0 0 0  PHQ- 9 Score 0 0   1 0 0    Fall Risk     11/02/2023    4:45 PM 02/03/2023    3:22 PM 10/29/2022    2:56 PM 10/28/2022    5:40 PM 10/28/2022    1:05 PM  Fall Risk   Falls in the past year? 0 0 0 0 0  Number falls in past yr: 0 0 0 0 0  Injury with Fall? 0 0 0  0  Risk for fall due to :  No Fall Risks History of fall(s)  No Fall Risks  Follow up Falls evaluation  completed;Falls prevention discussed Falls evaluation completed Falls evaluation completed  Falls evaluation completed    MEDICARE RISK AT HOME:  Medicare Risk at Home Any stairs in or around the home?: (Patient-Rptd) Yes If so, are there any without handrails?: (Patient-Rptd) No Home free of loose throw rugs in walkways, pet beds, electrical cords, etc?: (Patient-Rptd) Yes Adequate lighting in your home to reduce risk of falls?:  (Patient-Rptd) Yes Life alert?: (Patient-Rptd) No Use of a cane, walker or w/c?: (Patient-Rptd) No Grab bars in the bathroom?: (Patient-Rptd) Yes Shower chair or bench in shower?: (Patient-Rptd) No Elevated toilet seat or a handicapped toilet?: (Patient-Rptd) No  TIMED UP AND GO:  Was the test performed?  No  Cognitive Function: Declined/Normal: No cognitive concerns noted by patient or family. Patient alert, oriented, able to answer questions appropriately and recall recent events. No signs of memory loss or confusion.        10/29/2022    3:03 PM  6CIT Screen  What Year? 0 points  What month? 0 points  What time? 0 points  Count back from 20 0 points  Months in reverse 0 points  Repeat phrase 0 points  Total Score 0 points    Immunizations Immunization History  Administered Date(s) Administered   Influenza Split 12/14/2013   Influenza,inj,Quad PF,6+ Mos 12/31/2015, 12/26/2016, 01/17/2018, 01/10/2019   PNEUMOCOCCAL CONJUGATE-20 04/28/2021   Pneumococcal Polysaccharide-23 10/25/2017   Tdap 01/19/2012   Zoster, Live 02/12/2016    Screening Tests Health Maintenance  Topic Date Due   Diabetic kidney evaluation - Urine ACR  Never done   Zoster Vaccines- Shingrix (1 of 2) 11/22/2005   FOOT EXAM  01/10/2020   OPHTHALMOLOGY EXAM  09/10/2023   INFLUENZA VACCINE  10/01/2023   Medicare Annual Wellness (AWV)  10/29/2023   HEMOGLOBIN A1C  02/03/2024   MAMMOGRAM  07/29/2024   Diabetic kidney evaluation - eGFR measurement  08/03/2024   Colonoscopy  08/21/2024   Pneumococcal Vaccine: 50+ Years  Completed   DEXA SCAN  Completed   Hepatitis C Screening  Completed   HPV VACCINES  Aged Out   Meningococcal B Vaccine  Aged Out   DTaP/Tdap/Td  Discontinued   COVID-19 Vaccine  Discontinued    Health Maintenance  Health Maintenance Due  Topic Date Due   Diabetic kidney evaluation - Urine ACR  Never done   Zoster Vaccines- Shingrix (1 of 2) 11/22/2005   FOOT EXAM  01/10/2020    OPHTHALMOLOGY EXAM  09/10/2023   INFLUENZA VACCINE  10/01/2023   Medicare Annual Wellness (AWV)  10/29/2023   Health Maintenance Items Addressed: Diabetic Foot Exam recommended, Labs Ordered: UACR, See Nurse Notes at the end of this note  Additional Screening:  Vision Screening: Recommended annual ophthalmology exams for early detection of glaucoma and other disorders of the eye. Would you like a referral to an eye doctor? No    Dental Screening: Recommended annual dental exams for proper oral hygiene  Community Resource Referral / Chronic Care Management: CRR required this visit?  No   CCM required this visit?  No   Plan:    I have personally reviewed and noted the following in the patient's chart:   Medical and social history Use of alcohol, tobacco or illicit drugs  Current medications and supplements including opioid prescriptions. Patient is not currently taking opioid prescriptions. Functional ability and status Nutritional status Physical activity Advanced directives List of other physicians Hospitalizations, surgeries, and ER visits in previous 12 months Vitals Screenings to include cognitive,  depression, and falls Referrals and appointments  In addition, I have reviewed and discussed with patient certain preventive protocols, quality metrics, and best practice recommendations. A written personalized care plan for preventive services as well as general preventive health recommendations were provided to patient.   Rakesha Dalporto L Jasenia Weilbacher, CMA   11/03/2023   After Visit Summary: (MyChart) Due to this being a telephonic visit, the after visit summary with patients personalized plan was offered to patient via MyChart   Notes: Patient is due for a Shingrix vaccine a Flu vaccine and a Tdap.  Patient is also due for a foot exam, an A1c check and a UACR, which that order has been placed today.  She is also due for a diabetic eye exam.

## 2023-11-03 NOTE — Patient Instructions (Signed)
 Ms. Switalski , Thank you for taking time out of your busy schedule to complete your Annual Wellness Visit with me. I enjoyed our conversation and look forward to speaking with you again next year. I, as well as your care team,  appreciate your ongoing commitment to your health goals. Please review the following plan we discussed and let me know if I can assist you in the future. Your Game plan/ To Do List    Referrals: If you haven't heard from the office you've been referred to, please reach out to them at the phone provided.   Follow up Visits: We will see or speak with you next year for your Next Medicare AWV with our clinical staff Have you seen your provider in the last 6 months (3 months if uncontrolled diabetes)? Yes.  Last office visit on 08/04/2023.  Next office visit on 02/04/2024.  Clinician Recommendations:  Aim for 30 minutes of exercise or brisk walking, 6-8 glasses of water, and 5 servings of fruits and vegetables each day. You are due for a Shingles vaccine and a Flu vaccine and a tetanus vaccine, which can be done at your pharmacy, ( know you do not have to get all at 1 time).   You are due for a diabetic eye exam, please call to get scheduled for it.  You are also due for a foot exam, an A1C check and a kidney evaluation, which you will get during your next office visit.         This is a list of the screenings recommended for you:  Health Maintenance  Topic Date Due   Yearly kidney health urinalysis for diabetes  Never done   Zoster (Shingles) Vaccine (1 of 2) 11/22/2005   Complete foot exam   01/10/2020   Eye exam for diabetics  09/10/2023   Flu Shot  10/01/2023   Medicare Annual Wellness Visit  10/29/2023   Hemoglobin A1C  02/03/2024   Mammogram  07/29/2024   Yearly kidney function blood test for diabetes  08/03/2024   Colon Cancer Screening  08/21/2024   Pneumococcal Vaccine for age over 37  Completed   DEXA scan (bone density measurement)  Completed   Hepatitis C  Screening  Completed   HPV Vaccine  Aged Out   Meningitis B Vaccine  Aged Out   DTaP/Tdap/Td vaccine  Discontinued   COVID-19 Vaccine  Discontinued    Advanced directives: (Declined) Advance directive discussed with you today. Even though you declined this today, please call our office should you change your mind, and we can give you the proper paperwork for you to fill out. Advance Care Planning is important because it:  [x]  Makes sure you receive the medical care that is consistent with your values, goals, and preferences  [x]  It provides guidance to your family and loved ones and reduces their decisional burden about whether or not they are making the right decisions based on your wishes.  Follow the link provided in your after visit summary or read over the paperwork we have mailed to you to help you started getting your Advance Directives in place. If you need assistance in completing these, please reach out to us  so that we can help you!  See attachments for Preventive Care and Fall Prevention Tips.

## 2023-11-08 ENCOUNTER — Telehealth: Payer: Self-pay

## 2023-11-08 NOTE — Telephone Encounter (Signed)
 Copied from CRM [id number]. Topic: Clinical - Prescription Issue >> Nov 08, 2023  3:38 PM Michelle Sanchez wrote: Reason for CRM: patient called stating the pharmacy messed up her prescription for cyclobenzaprine . The doctor sent in a 90 day fill but they only gave her 50 pills and stated she need a pre authorization to fill the medication Patient stated the Glimepiride  has ran out last week and it is being filled with the cyclobenzaprine  CB [phone]

## 2023-11-10 ENCOUNTER — Telehealth: Payer: Self-pay

## 2023-11-10 ENCOUNTER — Other Ambulatory Visit (HOSPITAL_COMMUNITY): Payer: Self-pay

## 2023-11-10 NOTE — Telephone Encounter (Signed)
 Pharmacy Patient Advocate Encounter   Received notification from Onbase that prior authorization for Cyclobenzaprine  HCl 10MG  tablets  is required/requested.   Insurance verification completed.   The patient is insured through [employer] .   Per test claim: PA required; PA submitted to above mentioned insurance via Latent Key/confirmation #/EOC [id number] Status is pending

## 2023-11-10 NOTE — Telephone Encounter (Signed)
 Pharmacy Patient Advocate Encounter  Received notification from [employer] that Prior Authorization for Cyclobenzaprine  HCl 10MG  tablets  has been APPROVED from 11/10/23 to 02/08/24   PA #/Case ID/Reference #: [id number]

## 2023-11-13 ENCOUNTER — Other Ambulatory Visit: Payer: Self-pay | Admitting: Internal Medicine

## 2023-11-17 ENCOUNTER — Ambulatory Visit: Admitting: Physician Assistant

## 2023-11-17 ENCOUNTER — Encounter: Payer: Self-pay | Admitting: Physician Assistant

## 2023-11-17 VITALS — BP 116/77 | HR 103

## 2023-11-17 DIAGNOSIS — D489 Neoplasm of uncertain behavior, unspecified: Secondary | ICD-10-CM | POA: Diagnosis not present

## 2023-11-17 DIAGNOSIS — L910 Hypertrophic scar: Secondary | ICD-10-CM | POA: Diagnosis not present

## 2023-11-17 DIAGNOSIS — L84 Corns and callosities: Secondary | ICD-10-CM

## 2023-11-17 NOTE — Progress Notes (Signed)
   New Patient Visit   Subjective  Michelle Sanchez is a 68 y.o. female NEW PATIENT who presents for the following: spot check  Patient states she has spot located at the RIGHT FOOT AND LEFT BREAST that she would like to have examined. Patient reports the areas have been there for 1 year on her foot and 6 months on her breast.   Mammogram performed last year was normal. Her mother had breast cancer.  Denies any injury to left breast. No similar lesion(s) on right breast. Denies pain and/or itching. Was prescribed topical RX that did nothing.    The patient has spots, moles and lesions to be evaluated, some may be new or changing and the patient may have concern these could be cancer.   The following portions of the chart were reviewed this encounter and updated as appropriate: medications, allergies, medical history  Review of Systems:  No other skin or systemic complaints except as noted in HPI or Assessment and Plan.  Objective  Well appearing patient in no apparent distress; mood and affect are within normal limits.   A focused examination was performed of the following areas: foot and both breasts.   Relevant exam findings are noted in the Assessment and Plan.  Left Breast Left breast at 3' o'clock indurated nodule 2.0 cm    Assessment & Plan   NEOPLASM OF UNCERTAIN BEHAVIOR -- LEFT BREAST  - biopsy today  - strongly recommend she schedule her mammogram for this year (and mention this site)  CLAVUS - Right foot - benign  - can use pumice stone, if desires     NEOPLASM OF UNCERTAIN BEHAVIOR Left Breast Skin / nail biopsy Type of biopsy: tangential   Informed consent: discussed and consent obtained   Timeout: patient name, date of birth, surgical site, and procedure verified   Procedure prep:  Patient was prepped and draped in usual sterile fashion Prep type:  Isopropyl alcohol Anesthesia: the lesion was anesthetized in a standard fashion   Anesthetic:  1%  lidocaine w/ epinephrine 1-100,000 buffered w/ 8.4% NaHCO3 Instrument used: flexible razor blade   Hemostasis achieved with: pressure, aluminum chloride and electrodesiccation   Outcome: patient tolerated procedure well   Post-procedure details: sterile dressing applied and wound care instructions given   Dressing type: bandage and petrolatum    Specimen 1 - Surgical pathology Differential Diagnosis: Scar VS Breast cancer NMSC  Check Margins: No CLAVUS    Return for awaiting pending pathology.  I, Doyce Pan, CMA, am acting as scribe for Gertrude Bucks K, PA-C.   Documentation: I have reviewed the above documentation for accuracy and completeness, and I agree with the above.  Ave Scharnhorst K, PA-C

## 2023-11-17 NOTE — Patient Instructions (Addendum)

## 2023-11-18 LAB — SURGICAL PATHOLOGY

## 2023-11-20 ENCOUNTER — Ambulatory Visit: Payer: Self-pay | Admitting: Physician Assistant

## 2023-11-26 ENCOUNTER — Other Ambulatory Visit: Payer: Self-pay | Admitting: Internal Medicine

## 2023-11-26 DIAGNOSIS — Z0000 Encounter for general adult medical examination without abnormal findings: Secondary | ICD-10-CM

## 2023-12-03 ENCOUNTER — Ambulatory Visit

## 2023-12-13 ENCOUNTER — Other Ambulatory Visit: Payer: Self-pay | Admitting: Thoracic Surgery (Cardiothoracic Vascular Surgery)

## 2023-12-13 DIAGNOSIS — I712 Thoracic aortic aneurysm, without rupture, unspecified: Secondary | ICD-10-CM

## 2023-12-17 ENCOUNTER — Ambulatory Visit
Admission: RE | Admit: 2023-12-17 | Discharge: 2023-12-17 | Disposition: A | Discharge status: Home / Self Care | Source: Ambulatory Visit | Attending: Internal Medicine | Admitting: Internal Medicine

## 2023-12-17 DIAGNOSIS — Z0000 Encounter for general adult medical examination without abnormal findings: Secondary | ICD-10-CM

## 2023-12-23 ENCOUNTER — Other Ambulatory Visit: Payer: Self-pay | Admitting: Internal Medicine

## 2023-12-27 ENCOUNTER — Ambulatory Visit (INDEPENDENT_AMBULATORY_CARE_PROVIDER_SITE_OTHER): Admitting: Pulmonary Disease

## 2023-12-27 ENCOUNTER — Encounter: Payer: Self-pay | Admitting: Pulmonary Disease

## 2023-12-27 VITALS — BP 133/89 | HR 109 | Ht 68.0 in | Wt 264.4 lb

## 2023-12-27 DIAGNOSIS — G4733 Obstructive sleep apnea (adult) (pediatric): Secondary | ICD-10-CM | POA: Diagnosis not present

## 2023-12-27 NOTE — Patient Instructions (Signed)
 I will see you about a year from now  The download from your machine shows it is working well  Continue using your CPAP on a nightly basis  Call us  with significant concerns  Continue weight loss efforts

## 2023-12-27 NOTE — Progress Notes (Signed)
 Michelle Sanchez    [id number]    [birth year]  Primary Care Physician:Burns, Glade PARAS, MD  Referring Physician: Geofm Glade PARAS, MD [address] [place],  [place] [address]  Chief complaint:   Patient being seen for obstructive sleep apnea has been on CPAP for over 20 years   HPI:  Continues to use CPAP nightly Not having any problems with her CPAP  Sleeping well, waking up feeling like she has had a good nights rest  Denies any pain or discomfort  Usually wakes up between 7 and 7:30 in the morning feeling rested  Wakes up feeling like she is at a good nights rest No significant dryness of the mouth in the morning No morning headaches Both parents snored  History of hypertension, diabetes-well-controlled  Never smoker  History of lung nodule  Outpatient Encounter Medications as of 12/27/2023  Medication Sig   amitriptyline  (ELAVIL ) 25 MG tablet Take 1 tablet (25 mg total) by mouth at bedtime.   amLODipine  (NORVASC ) 5 MG tablet TAKE 1 TABLET DAILY   cyclobenzaprine  (FLEXERIL ) 10 MG tablet Take 1 tablet (10 mg total) by mouth at bedtime.   ezetimibe  (ZETIA ) 10 MG tablet TAKE 1 TABLET DAILY   gabapentin  (NEURONTIN ) 300 MG capsule TAKE 1 CAPSULE IN THE      MORNING, 1 CAPSULE IN THE  AFTERNOON AND 4CAPSULES ATBEDTIME   glimepiride  (AMARYL ) 4 MG tablet Take 1 tablet (4 mg total) by mouth daily before breakfast.   glucose blood (ONETOUCH ULTRA) test strip Use as directed to check blood sugar once daily E11.9   JARDIANCE  25 MG TABS tablet TAKE 1 TABLET DAILY BEFORE BREAKFAST (NEW DOSE)   Lancets (ONETOUCH DELICA PLUS LANCET33G) MISC Use as directed to check blood sugar once daily E11.9   meloxicam  (MOBIC ) 15 MG tablet TAKE 1 TABLET DAILY   omeprazole  (PRILOSEC) 20 MG capsule TAKE 1 CAPSULE DAILY   ondansetron  (ZOFRAN -ODT) 4 MG disintegrating tablet Take 1 tablet (4 mg total) by mouth every 8 (eight) hours as needed for nausea or vomiting.   No  facility-administered encounter medications on file as of 12/27/2023.    Allergies as of 12/27/2023 - Review Complete 12/27/2023  Allergen Reaction Noted   Diclofenac Hives 03/28/2014   Ozempic  (0.25 or 0.5 mg-dose) [semaglutide (0.25 or 0.5mg -dos)]  08/04/2021   Prednisone  04/07/2011   Diclofenac sodium Itching 02/18/2011   Metformin  and related Nausea Only 01/03/2019   Septra [bactrim] Diarrhea 02/18/2011   Trimethoprim Diarrhea 06/24/2020    Past Medical History:  Diagnosis Date   Allergic rhinitis    Allergy    Arthritis     knee s/p TKR 2/13   Depression    GERD (gastroesophageal reflux disease)    Hypertension    Migraines    OSA (obstructive sleep apnea)    uses cpap nightly   Shingles outbreak 12/2013   L arm/torso   Sleep apnea    cpap   Spinal stenosis     Past Surgical History:  Procedure Laterality Date   Knee replacement Left 2012   KNEE SURGERY     both knees   TOTAL KNEE ARTHROPLASTY  04/13/2011   Procedure: TOTAL KNEE ARTHROPLASTY;  Surgeon: Dempsey LULLA Moan, MD;  Location: [facility];  Service: Orthopedics;  Laterality: Left;    Family History  Problem Relation Age of Onset   Arthritis Mother    Breast cancer Mother    Emphysema Mother    Asthma  Mother    Heart disease Mother    Arthritis Father    Cancer Father    Alcohol abuse Other    Diabetes Other    Colon cancer Neg Hx     Social History   Socioeconomic History   Marital status: Significant Other    Spouse name: Not on file   Number of children: Not on file   Years of education: Not on file   Highest education level: 12th grade  Occupational History   Occupation: retired  Tobacco Use   Smoking status: Never   Smokeless tobacco: Never  Vaping Use   Vaping status: Never Used  Substance and Sexual Activity   Alcohol use: Yes    Alcohol/week: 1.0 standard drink of alcohol    Types: 1 Glasses of wine per week    Comment: rare social   Drug use: No   Sexual activity: Yes  Other  Topics Concern   Not on file  Social History Narrative   Lives with significant other/2025 and 1 cat   Social Drivers of Corporate Investment Banker Strain: Low Risk  (11/02/2023)   Overall Financial Resource Strain (CARDIA)    Difficulty of Paying Living Expenses: Not hard at all  Food Insecurity: No Food Insecurity (11/02/2023)   Hunger Vital Sign    Worried About Running Out of Food in the Last Year: Never true    Ran Out of Food in the Last Year: Never true  Transportation Needs: No Transportation Needs (11/02/2023)   PRAPARE - Administrator, Civil Service (Medical): No    Lack of Transportation (Non-Medical): No  Physical Activity: Insufficiently Active (11/02/2023)   Exercise Vital Sign    Days of Exercise per Week: 2 days    Minutes of Exercise per Session: 30 min  Stress: No Stress Concern Present (11/02/2023)   [employer] of Occupational Health - Occupational Stress Questionnaire    Feeling of Stress: Not at all  Social Connections: Moderately Isolated (11/02/2023)   Social Connection and Isolation Panel    Frequency of Communication with Friends and Family: Once a week    Frequency of Social Gatherings with Friends and Family: Once a week    Attends Religious Services: 1 to 4 times per year    Active Member of [employer] or Organizations: No    Attends Banker Meetings: Not on file    Marital Status: Living with partner  Intimate Partner Violence: Not At Risk (11/03/2023)   Humiliation, Afraid, Rape, and Kick questionnaire    Fear of Current or Ex-Partner: No    Emotionally Abused: No    Physically Abused: No    Sexually Abused: No    Review of Systems  Constitutional:  Negative for fatigue.  Respiratory:  Positive for apnea.   Psychiatric/Behavioral:  Positive for sleep disturbance.     Vitals:   12/27/23 1335  BP: 133/89  Pulse: (!) 109  SpO2: 97%     Physical Exam Constitutional:      Appearance: She is obese.  HENT:     Head:  Normocephalic.     Mouth/Throat:     Mouth: Mucous membranes are moist.     Comments: Mallampati 3, crowded oropharynx Eyes:     General: No scleral icterus.    Pupils: Pupils are equal, round, and reactive to light.  Cardiovascular:     Rate and Rhythm: Normal rate and regular rhythm.     Pulses: Normal pulses.  Heart sounds: No murmur heard.    No friction rub.  Pulmonary:     Effort: No respiratory distress.     Breath sounds: No stridor. No wheezing or rhonchi.  Musculoskeletal:     Cervical back: No rigidity or tenderness.  Neurological:     Mental Status: She is alert.  Psychiatric:        Mood and Affect: Mood normal.       11/18/2021    2:00 PM  Results of the Epworth flowsheet  Sitting and reading 0  Watching TV 0  Sitting, inactive in a public place (e.g. a theatre or a meeting) 0  As a passenger in a car for an hour without a break 0  Lying down to rest in the afternoon when circumstances permit 2  Sitting and talking to someone 0  Sitting quietly after a lunch without alcohol 0  In a car, while stopped for a few minutes in traffic 0  Total score 2   Data Reviewed: Compliance download from the machine shows 100% compliance Average use of 8 hours 51 minutes AutoSet 5-20 with an EPR of 3 Residual AHI of 4.9  Most recent sleep study 12/15/2021 showing severe obstructive sleep apnea  Assessment:  Severe obstructive sleep apnea - Appears well treated with CPAP therapy at current settings - Using it nightly - Benefiting from use - Denies significant daytime symptoms  Class III obesity - Continues to work on weight loss efforts  Diabetes - Stable  Hypertension - Stable  History of lung nodules Being followed - Stable  Plan/Recommendations:  Continue nightly CPAP  Continue weight loss efforts  Follow-up a year from now  Encouraged to call with significant concerns Jennet Epley MD [facility] 12/27/2023, 1:41  PM  CC: Geofm Glade PARAS, MD

## 2024-01-12 ENCOUNTER — Other Ambulatory Visit: Payer: Self-pay | Admitting: Internal Medicine

## 2024-01-12 ENCOUNTER — Ambulatory Visit (HOSPITAL_COMMUNITY)
Admission: RE | Admit: 2024-01-12 | Discharge: 2024-01-12 | Disposition: A | Discharge status: Home / Self Care | Source: Ambulatory Visit | Attending: Internal Medicine | Admitting: Internal Medicine

## 2024-01-12 DIAGNOSIS — I712 Thoracic aortic aneurysm, without rupture, unspecified: Secondary | ICD-10-CM | POA: Insufficient documentation

## 2024-01-12 MED ORDER — IOHEXOL 350 MG/ML SOLN
75.0000 mL | Freq: Once | INTRAVENOUS | Status: AC | PRN
  Administered 2024-01-12: 75 mL via INTRAVENOUS

## 2024-01-25 ENCOUNTER — Ambulatory Visit

## 2024-02-04 ENCOUNTER — Ambulatory Visit: Admitting: Internal Medicine

## 2024-02-08 ENCOUNTER — Ambulatory Visit

## 2024-02-08 VITALS — BP 131/86 | HR 107 | Resp 18 | Ht 68.0 in

## 2024-02-08 DIAGNOSIS — I712 Thoracic aortic aneurysm, without rupture, unspecified: Secondary | ICD-10-CM | POA: Diagnosis not present

## 2024-02-08 NOTE — Patient Instructions (Signed)
 Risk Modification in those with ascending thoracic aortic aneurysm:  -Continue control of blood pressure (prefer BP 130/80 or less)  -Avoid fluoroquinolone antibiotics (I.e Ciprofloxacin, Avelox, Levofloxacin , Ofloxacin) -Exercise and activity limitations is individualized, but in general, contact sports are to be avoided and one should avoid heavy lifting (defined as half of ideal body weight) and exercises involving sustained Valsalva maneuver. -Follow-up in one year with CTA chest.

## 2024-02-08 NOTE — Progress Notes (Signed)
 [address] [place] [address]             [phone]            Michelle Sanchez [id number] 07-Feb-1956   History of Present Illness:  Michelle Sanchez is a 68 year old female with medical history of hypertension, migraines, OSA, GERD, type 2 diabetes, osteoarthritis, obesity and hyperlipidemia who presents for continued follow-up of ascending thoracic aortic aneurysm.  Aneurysm has stayed stable in size and on recent CTA measured 4.4 cm.  Echocardiogram from 01/2023 showed that the aortic valve is normal in structure.  Aortic valve regurgitation and aortic stenosis was not present.  She presents to the clinic today and reports that she is doing well.  Her blood pressure is well-controlled with current medication therapy.  She has been trying to make dietary changes to help with her type 2 diabetes.  She reports that she is not very active and denies heavy lifting.  She does report that her mother had an abdominal aortic aneurysm.  she denies chest pain, shortness of breath or lower leg swelling.     Current Outpatient Medications on File Prior to Visit  Medication Sig Dispense Refill   amitriptyline  (ELAVIL ) 25 MG tablet Take 1 tablet (25 mg total) by mouth at bedtime. 90 tablet 2   amLODipine  (NORVASC ) 5 MG tablet TAKE 1 TABLET DAILY 90 tablet 3   cyclobenzaprine  (FLEXERIL ) 10 MG tablet Take 1 tablet (10 mg total) by mouth at bedtime. 90 tablet 3   ezetimibe  (ZETIA ) 10 MG tablet TAKE 1 TABLET DAILY 90 tablet 2   gabapentin  (NEURONTIN ) 300 MG capsule TAKE 1 CAPSULE IN THE      MORNING, 1 CAPSULE IN THE  AFTERNOON AND 4CAPSULES ATBEDTIME 540 capsule 3   glimepiride  (AMARYL ) 4 MG tablet Take 1 tablet (4 mg total) by mouth daily before breakfast. 90 tablet 3   glucose blood (ONETOUCH ULTRA) test strip Use as directed to check blood sugar once daily E11.9 100 each 3   JARDIANCE  25 MG TABS tablet TAKE 1 TABLET DAILY BEFORE BREAKFAST (NEW DOSE) 90 tablet 2    Lancets (ONETOUCH DELICA PLUS LANCET33G) MISC Use as directed to check blood sugar once daily E11.9 100 each 0   meloxicam  (MOBIC ) 15 MG tablet TAKE 1 TABLET DAILY 90 tablet 1   omeprazole  (PRILOSEC) 20 MG capsule TAKE 1 CAPSULE DAILY 90 capsule 3   ondansetron  (ZOFRAN -ODT) 4 MG disintegrating tablet Take 1 tablet (4 mg total) by mouth every 8 (eight) hours as needed for nausea or vomiting. 30 tablet 5   No current facility-administered medications on file prior to visit.     ROS: Review of Systems  Constitutional: Negative.  Negative for malaise/fatigue.  Respiratory: Negative.  Negative for cough and shortness of breath.   Cardiovascular:  Negative for chest pain, palpitations and leg swelling.     BP 131/86   Pulse (!) 107   Resp 18   Ht 5' 8 (1.727 m)   SpO2 97%   BMI 40.20 kg/m   Physical Exam Constitutional:      Appearance: Normal appearance.  HENT:     Head: Normocephalic and atraumatic.  Musculoskeletal:     Cervical back: Normal range of motion.  Skin:    General: Skin is warm and dry.  Neurological:     General: No focal deficit present.     Mental Status: She is  alert.      Imaging: CLINICAL DATA:  Aortic aneurysm suspected.   EXAM: CT ANGIOGRAPHY CHEST WITH CONTRAST   TECHNIQUE: Multidetector CT imaging of the chest was performed using the standard protocol during bolus administration of intravenous contrast. Multiplanar CT image reconstructions and MIPs were obtained to evaluate the vascular anatomy.   RADIATION DOSE REDUCTION: This exam was performed according to the departmental dose-optimization program which includes automated exposure control, adjustment of the mA and/or kV according to patient size and/or use of iterative reconstruction technique.   CONTRAST:  75mL OMNIPAQUE  IOHEXOL  350 MG/ML SOLN   COMPARISON:  Chest CT 12/07/2022.   FINDINGS: Cardiovascular: Ascending aorta measures 4.4 cm, unchanged. There is no evidence for  aortic dissection. Descending thoracic aorta is normal in size. The heart is normal in size. There is no pericardial effusion. There is no central pulmonary embolism.   Mediastinum/Nodes: No enlarged mediastinal, hilar, or axillary lymph nodes. Thyroid  gland, trachea, and esophagus demonstrate no significant findings.   Lungs/Pleura: There are partially calcified nodules in the right middle lobe and bilateral lower lobes similar to the prior study. No new pulmonary nodules. The lungs are otherwise clear.   Upper Abdomen: No acute abnormality.   Musculoskeletal: No chest wall abnormality. No acute or significant osseous findings.   Review of the MIP images confirms the above findings.   IMPRESSION: 1. Stable ascending aortic aneurysm measuring 4.4 cm. Recommend annual imaging followup by CTA or MRA. This recommendation follows 2010 ACCF/AHA/AATS/ACR/ASA/SCA/SCAI/SIR/STS/SVM Guidelines for the Diagnosis and Management of Patients with Thoracic Aortic Disease. Circulation. 2010; 121: [id number]. Aortic aneurysm NOS (ICD10-I71.9)     Electronically Signed   By: Greig Pique M.D.   On: 01/12/2024 15:31     A/P: Thoracic aortic aneurysm without rupture, unspecified part -4.4 cm ascending thoracic aortic aneurysm on CTA of chest.  Echocardiogram showed normal aortic valve structure.  -We discussed the natural history and and risk factors for growth of ascending aortic aneurysms. Discussed recommendations to minimize the risk of further expansion or dissection including careful blood pressure control, avoidance of contact sports and heavy lifting, attention to lipid management.  We covered the importance of staying never user of tobacco.  The patient does not yet meet surgical criteria of >5.5cm. The patient is aware of signs and symptoms of aortic dissection and when to present to the emergency department   -Follow-up in 1 year with CTA of chest for continued surveillance    Risk  Modification:  Statin:  not currently prescribed, using zetia   Smoking cessation instruction/counseling given:  never user  Patient was counseled on importance of Blood Pressure Control  They are instructed to contact their Primary Care Physician if they start to have blood pressure readings over 130s/90s. Do not ever stop blood pressure medications on your own, unless instructed by healthcare professional.  Please avoid use of Fluoroquinolones as this can potentially increase your risk of Aortic Rupture and/or Dissection  Patient educated on signs and symptoms of Aortic Dissection, handout also provided in AVS  Manuelita CHRISTELLA Rough, PA-C 02/08/24

## 2024-02-10 ENCOUNTER — Encounter: Payer: Self-pay | Admitting: Internal Medicine

## 2024-02-10 NOTE — Progress Notes (Unsigned)
 Subjective:    [name], female    DOB: 03-04-1955, 68 y.o.   MRN: [id number]     HPI Michelle Sanchez is here for follow up of her chronic medical problems.  Sugar 147 yesterday morning.  She does a little exercise.  She has not been eating as well.     Medications and allergies reviewed with patient and updated if appropriate.  Medications Ordered Prior to Encounter[1]   Review of Systems  Constitutional:  Negative for fever.  Respiratory:  Negative for cough, shortness of breath and wheezing.   Cardiovascular:  Positive for leg swelling (minimal at end of day). Negative for chest pain and palpitations.  Neurological:  Positive for headaches (occ). Negative for light-headedness.       Objective:   Vitals:   02/11/24 1409  BP: 134/80  Pulse: 90  Temp: 98.3 F (36.8 C)  SpO2: 98%   BP Readings from Last 3 Encounters:  02/11/24 134/80  02/08/24 131/86  12/27/23 133/89   Wt Readings from Last 3 Encounters:  02/11/24 264 lb (119.7 kg)  12/27/23 264 lb 6.4 oz (119.9 kg)  11/03/23 260 lb (117.9 kg)   Body mass index is 40.14 kg/m.    Physical Exam Constitutional:      General: She is not in acute distress.    Appearance: Normal appearance.  HENT:     Head: Normocephalic and atraumatic.  Eyes:     Conjunctiva/sclera: Conjunctivae normal.  Cardiovascular:     Rate and Rhythm: Normal rate and regular rhythm.     Heart sounds: Normal heart sounds.  Pulmonary:     Effort: Pulmonary effort is normal. No respiratory distress.     Breath sounds: Normal breath sounds. No wheezing.  Musculoskeletal:     Cervical back: Neck supple.     Right lower leg: No edema.     Left lower leg: No edema.  Lymphadenopathy:     Cervical: No cervical adenopathy.  Skin:    General: Skin is warm and dry.     Findings: No rash.  Neurological:     Mental Status: She is alert. Mental status is at baseline.  Psychiatric:        Mood and Affect: Mood normal.         Behavior: Behavior normal.        Lab Results  Component Value Date   WBC 9.0 02/03/2023   HGB 15.6 (H) 02/03/2023   HCT 48.6 (H) 02/03/2023   PLT 312.0 02/03/2023   GLUCOSE 125 (H) 08/04/2023   CHOL 149 08/04/2023   TRIG 117.0 08/04/2023   HDL 44.10 08/04/2023   LDLDIRECT 164.0 10/28/2022   LDLCALC 81 08/04/2023   ALT 18 08/04/2023   AST 16 08/04/2023   NA 138 08/04/2023   K 3.9 08/04/2023   CL 102 08/04/2023   CREATININE 0.59 08/04/2023   BUN 17 08/04/2023   CO2 29 08/04/2023   TSH 1.22 10/28/2022   INR 1.00 04/07/2011   HGBA1C 8.3 (H) 08/04/2023     Assessment & Plan:    See Problem List for Assessment and Plan of chronic medical problems.       [1]  Current Outpatient Medications on File Prior to Visit  Medication Sig Dispense Refill   amitriptyline  (ELAVIL ) 25 MG tablet Take 1 tablet (25 mg total) by mouth at bedtime. 90 tablet 2   amLODipine  (NORVASC ) 5 MG tablet TAKE 1 TABLET DAILY 90 tablet 3  cyclobenzaprine  (FLEXERIL ) 10 MG tablet Take 1 tablet (10 mg total) by mouth at bedtime. 90 tablet 3   ezetimibe  (ZETIA ) 10 MG tablet TAKE 1 TABLET DAILY 90 tablet 2   gabapentin  (NEURONTIN ) 300 MG capsule TAKE 1 CAPSULE IN THE      MORNING, 1 CAPSULE IN THE  AFTERNOON AND 4CAPSULES ATBEDTIME 540 capsule 3   glimepiride  (AMARYL ) 4 MG tablet Take 1 tablet (4 mg total) by mouth daily before breakfast. 90 tablet 3   glucose blood (ONETOUCH ULTRA) test strip Use as directed to check blood sugar once daily E11.9 100 each 3   JARDIANCE  25 MG TABS tablet TAKE 1 TABLET DAILY BEFORE BREAKFAST (NEW DOSE) 90 tablet 2   Lancets (ONETOUCH DELICA PLUS LANCET33G) MISC Use as directed to check blood sugar once daily E11.9 100 each 0   meloxicam  (MOBIC ) 15 MG tablet TAKE 1 TABLET DAILY 90 tablet 1   omeprazole  (PRILOSEC) 20 MG capsule TAKE 1 CAPSULE DAILY 90 capsule 3   ondansetron  (ZOFRAN -ODT) 4 MG disintegrating tablet Take 1 tablet (4 mg total) by mouth every 8 (eight) hours  as needed for nausea or vomiting. 30 tablet 5   No current facility-administered medications on file prior to visit.

## 2024-02-11 ENCOUNTER — Ambulatory Visit: Admitting: Internal Medicine

## 2024-02-11 VITALS — BP 134/80 | HR 90 | Temp 98.3°F | Ht 68.0 in | Wt 264.0 lb

## 2024-02-11 DIAGNOSIS — E1159 Type 2 diabetes mellitus with other circulatory complications: Secondary | ICD-10-CM

## 2024-02-11 DIAGNOSIS — M48061 Spinal stenosis, lumbar region without neurogenic claudication: Secondary | ICD-10-CM

## 2024-02-11 DIAGNOSIS — E1169 Type 2 diabetes mellitus with other specified complication: Secondary | ICD-10-CM

## 2024-02-11 DIAGNOSIS — G4733 Obstructive sleep apnea (adult) (pediatric): Secondary | ICD-10-CM

## 2024-02-11 DIAGNOSIS — G629 Polyneuropathy, unspecified: Secondary | ICD-10-CM | POA: Diagnosis not present

## 2024-02-11 DIAGNOSIS — K219 Gastro-esophageal reflux disease without esophagitis: Secondary | ICD-10-CM

## 2024-02-11 DIAGNOSIS — M810 Age-related osteoporosis without current pathological fracture: Secondary | ICD-10-CM

## 2024-02-11 LAB — CBC WITH DIFFERENTIAL/PLATELET
Basophils Absolute: 0 K/uL (ref 0.0–0.1)
Basophils Relative: 0.5 % (ref 0.0–3.0)
Eosinophils Absolute: 0.2 K/uL (ref 0.0–0.7)
Eosinophils Relative: 2.7 % (ref 0.0–5.0)
HCT: 46.4 % — ABNORMAL HIGH (ref 36.0–46.0)
Hemoglobin: 15.5 g/dL — ABNORMAL HIGH (ref 12.0–15.0)
Lymphocytes Relative: 26 % (ref 12.0–46.0)
Lymphs Abs: 1.9 K/uL (ref 0.7–4.0)
MCHC: 33.3 g/dL (ref 30.0–36.0)
MCV: 85.7 fl (ref 78.0–100.0)
Monocytes Absolute: 0.5 K/uL (ref 0.1–1.0)
Monocytes Relative: 7 % (ref 3.0–12.0)
Neutro Abs: 4.7 K/uL (ref 1.4–7.7)
Neutrophils Relative %: 63.8 % (ref 43.0–77.0)
Platelets: 276 K/uL (ref 150.0–400.0)
RBC: 5.42 Mil/uL — ABNORMAL HIGH (ref 3.87–5.11)
RDW: 14.1 % (ref 11.5–15.5)
WBC: 7.4 K/uL (ref 4.0–10.5)

## 2024-02-11 LAB — COMPREHENSIVE METABOLIC PANEL WITH GFR
ALT: 24 U/L (ref 0–35)
AST: 23 U/L (ref 0–37)
Albumin: 4.6 g/dL (ref 3.5–5.2)
Alkaline Phosphatase: 90 U/L (ref 39–117)
BUN: 13 mg/dL (ref 6–23)
CO2: 25 meq/L (ref 19–32)
Calcium: 10.2 mg/dL (ref 8.4–10.5)
Chloride: 102 meq/L (ref 96–112)
Creatinine, Ser: 0.64 mg/dL (ref 0.40–1.20)
GFR: 90.93 mL/min (ref >60.00)
Glucose, Bld: 144 mg/dL — ABNORMAL HIGH (ref 70–99)
Potassium: 4.1 meq/L (ref 3.5–5.1)
Sodium: 139 meq/L (ref 135–145)
Total Bilirubin: 0.5 mg/dL (ref 0.2–1.2)
Total Protein: 8.2 g/dL (ref 6.0–8.3)

## 2024-02-11 LAB — LIPID PANEL
Cholesterol: 159 mg/dL (ref 0–200)
HDL: 51.1 mg/dL (ref >39.00)
LDL Cholesterol: 80 mg/dL (ref 0–99)
NonHDL: 107.87
Total CHOL/HDL Ratio: 3
Triglycerides: 137 mg/dL (ref 0.0–149.0)
VLDL: 27.4 mg/dL (ref 0.0–40.0)

## 2024-02-11 LAB — HEMOGLOBIN A1C: Hgb A1c MFr Bld: 8.4 % — ABNORMAL HIGH (ref 4.6–6.5)

## 2024-02-11 LAB — VITAMIN B12: Vitamin B-12: >1500 pg/mL — ABNORMAL HIGH (ref 211–911)

## 2024-02-11 LAB — MICROALBUMIN / CREATININE URINE RATIO
Creatinine,U: 47.8 mg/dL
Microalb Creat Ratio: UNDETERMINED mg/g (ref 0.0–30.0)
Microalb, Ur: <0.7 mg/dL

## 2024-02-11 NOTE — Assessment & Plan Note (Signed)
Chronic GERD controlled Continue omeprazole 20 mg daily  

## 2024-02-11 NOTE — Assessment & Plan Note (Addendum)
 Chronic Monitored by gynecologist

## 2024-02-11 NOTE — Assessment & Plan Note (Signed)
 Chronic Blood pressure controlled Cmp, cbc Continue amlodipine  5 mg daily

## 2024-02-11 NOTE — Assessment & Plan Note (Signed)
 Chronic Controlled  Continue meloxicam  15 mg daily-discussed possible side effects Continue gabapentin  300 mg in the morning, 300 mg in the afternoon and 1200 mg at bedtime Continue flexeril  10 mg HS

## 2024-02-11 NOTE — Assessment & Plan Note (Signed)
 Chronic Related to back pain, but diabetes may start contributing Continue gabapentin  300 mg in the morning and afternoon and 1200 mg at bedtime Continue amitriptyline  25 mg at bedtime Continue flexeril  10 mg HS

## 2024-02-11 NOTE — Assessment & Plan Note (Signed)
Chronic Using cpap nightly Follows with pulm

## 2024-02-11 NOTE — Patient Instructions (Addendum)
° ° ° ° °  Blood work was ordered.       Medications changes include :   None      Return in about 6 months (around 08/11/2024) for follow up.

## 2024-02-11 NOTE — Assessment & Plan Note (Signed)
 Chronic BMI 40.14 with diabetes, hyperlipidemia, hypertension Stressed weight loss She is exercising a little-stressed she needs to walk on the treadmill daily and slowly increase the amount that she is walking Stressed diabetic diet

## 2024-02-11 NOTE — Assessment & Plan Note (Signed)
 Chronic Associated with hyperlipidemia, hypertension, morbid obesity Lab Results  Component Value Date   HGBA1C 8.3 (H) 08/04/2023   Sugars not ideally controlled  She has been taking supplement-berberine Check A1c, urine albumin/creatinine ratio Continue Jardiance  25 mg daily, glimepiride  4 mg daily Stressed increased exercise Stressed diabetic diet May need to consider adding insulin

## 2024-02-11 NOTE — Assessment & Plan Note (Signed)
 Chronic Lab Results  Component Value Date   LDLCALC 81 08/04/2023   Regular exercise and healthy diet encouraged Check lipid panel  Continue Zetia  10 mg daily

## 2024-02-12 ENCOUNTER — Ambulatory Visit: Payer: Self-pay | Admitting: Internal Medicine

## 2024-02-14 MED ORDER — ROSUVASTATIN CALCIUM 5 MG PO TABS: 5.0000 mg | ORAL_TABLET | Freq: Every day | ORAL | 3 refills | Status: AC

## 2024-02-14 MED ORDER — SITAGLIPTIN PHOSPHATE 100 MG PO TABS: 100.0000 mg | ORAL_TABLET | Freq: Every day | ORAL | 3 refills | Status: AC

## 2024-11-03 ENCOUNTER — Ambulatory Visit
